# Patient Record
Sex: Male | Born: 1950 | Race: White | Hispanic: No | Marital: Married | Smoking: Former smoker
Health system: Southern US, Community
[De-identification: ages and names within clinical notes are randomized; demographics above are authoritative.]

## PROBLEM LIST (undated history)

## (undated) DIAGNOSIS — Z87891 Personal history of nicotine dependence: Secondary | ICD-10-CM

## (undated) DIAGNOSIS — I495 Sick sinus syndrome: Secondary | ICD-10-CM

## (undated) DIAGNOSIS — I4719 Other supraventricular tachycardia: Secondary | ICD-10-CM

## (undated) DIAGNOSIS — K219 Gastro-esophageal reflux disease without esophagitis: Secondary | ICD-10-CM

## (undated) DIAGNOSIS — M1990 Unspecified osteoarthritis, unspecified site: Secondary | ICD-10-CM

## (undated) DIAGNOSIS — I471 Supraventricular tachycardia: Secondary | ICD-10-CM

## (undated) DIAGNOSIS — F419 Anxiety disorder, unspecified: Secondary | ICD-10-CM

## (undated) DIAGNOSIS — E0590 Thyrotoxicosis, unspecified without thyrotoxic crisis or storm: Secondary | ICD-10-CM

## (undated) DIAGNOSIS — J189 Pneumonia, unspecified organism: Secondary | ICD-10-CM

## (undated) DIAGNOSIS — G4700 Insomnia, unspecified: Secondary | ICD-10-CM

## (undated) DIAGNOSIS — E785 Hyperlipidemia, unspecified: Secondary | ICD-10-CM

## (undated) DIAGNOSIS — M069 Rheumatoid arthritis, unspecified: Secondary | ICD-10-CM

## (undated) DIAGNOSIS — Z8619 Personal history of other infectious and parasitic diseases: Secondary | ICD-10-CM

## (undated) DIAGNOSIS — I10 Essential (primary) hypertension: Secondary | ICD-10-CM

## (undated) DIAGNOSIS — Z950 Presence of cardiac pacemaker: Secondary | ICD-10-CM

## (undated) DIAGNOSIS — I499 Cardiac arrhythmia, unspecified: Secondary | ICD-10-CM

## (undated) DIAGNOSIS — R001 Bradycardia, unspecified: Secondary | ICD-10-CM

## (undated) DIAGNOSIS — G8929 Other chronic pain: Secondary | ICD-10-CM

## (undated) HISTORY — DX: Anxiety disorder, unspecified: F41.9

## (undated) HISTORY — DX: Rheumatoid arthritis, unspecified: M06.9

## (undated) HISTORY — DX: Unspecified osteoarthritis, unspecified site: M19.90

## (undated) HISTORY — PX: COLONOSCOPY W/ POLYPECTOMY: SHX1380

## (undated) HISTORY — DX: Sick sinus syndrome: I49.5

## (undated) HISTORY — DX: Insomnia, unspecified: G47.00

## (undated) HISTORY — DX: Personal history of nicotine dependence: Z87.891

## (undated) HISTORY — DX: Supraventricular tachycardia: I47.1

## (undated) HISTORY — DX: Personal history of other infectious and parasitic diseases: Z86.19

## (undated) HISTORY — DX: Hyperlipidemia, unspecified: E78.5

## (undated) HISTORY — DX: Other supraventricular tachycardia: I47.19

---

## 2006-07-14 ENCOUNTER — Observation Stay (HOSPITAL_COMMUNITY): Admission: EM | Admit: 2006-07-14 | Discharge: 2006-07-14 | Payer: Self-pay | Admitting: Emergency Medicine

## 2007-04-29 ENCOUNTER — Ambulatory Visit (HOSPITAL_COMMUNITY): Admission: RE | Admit: 2007-04-29 | Discharge: 2007-04-29 | Payer: Self-pay | Admitting: Specialist

## 2007-09-09 ENCOUNTER — Encounter (HOSPITAL_COMMUNITY): Admission: RE | Admit: 2007-09-09 | Discharge: 2007-09-24 | Payer: Self-pay | Admitting: Endocrinology

## 2007-09-27 ENCOUNTER — Emergency Department (HOSPITAL_COMMUNITY): Admission: EM | Admit: 2007-09-27 | Discharge: 2007-09-28 | Payer: Self-pay | Admitting: Emergency Medicine

## 2008-05-11 ENCOUNTER — Emergency Department (HOSPITAL_COMMUNITY): Admission: EM | Admit: 2008-05-11 | Discharge: 2008-05-11 | Payer: Self-pay | Admitting: Family Medicine

## 2008-06-21 ENCOUNTER — Encounter: Admission: RE | Admit: 2008-06-21 | Discharge: 2008-06-21 | Payer: Self-pay | Admitting: Endocrinology

## 2009-11-29 ENCOUNTER — Encounter
Admission: RE | Admit: 2009-11-29 | Discharge: 2010-02-27 | Payer: Self-pay | Admitting: Physical Medicine and Rehabilitation

## 2009-12-07 ENCOUNTER — Ambulatory Visit: Payer: Self-pay | Admitting: Physical Medicine and Rehabilitation

## 2009-12-07 ENCOUNTER — Ambulatory Visit (HOSPITAL_COMMUNITY)
Admission: RE | Admit: 2009-12-07 | Discharge: 2009-12-07 | Payer: Self-pay | Admitting: Physical Medicine and Rehabilitation

## 2010-01-04 ENCOUNTER — Ambulatory Visit: Payer: Self-pay | Admitting: Physical Medicine and Rehabilitation

## 2010-01-08 ENCOUNTER — Inpatient Hospital Stay (HOSPITAL_COMMUNITY): Admission: EM | Admit: 2010-01-08 | Discharge: 2010-01-13 | Payer: Self-pay | Admitting: Emergency Medicine

## 2010-01-09 ENCOUNTER — Encounter (INDEPENDENT_AMBULATORY_CARE_PROVIDER_SITE_OTHER): Payer: Self-pay | Admitting: Cardiology

## 2010-01-10 ENCOUNTER — Encounter (INDEPENDENT_AMBULATORY_CARE_PROVIDER_SITE_OTHER): Payer: Self-pay | Admitting: Cardiology

## 2010-01-12 DIAGNOSIS — I495 Sick sinus syndrome: Secondary | ICD-10-CM

## 2010-01-12 HISTORY — PX: PACEMAKER INSERTION: SHX728

## 2010-01-12 HISTORY — DX: Sick sinus syndrome: I49.5

## 2010-05-01 ENCOUNTER — Encounter
Admission: RE | Admit: 2010-05-01 | Discharge: 2010-05-01 | Payer: Self-pay | Source: Home / Self Care | Attending: Gastroenterology | Admitting: Gastroenterology

## 2010-06-04 ENCOUNTER — Encounter: Payer: Self-pay | Admitting: Endocrinology

## 2010-06-04 ENCOUNTER — Encounter (HOSPITAL_COMMUNITY): Payer: Self-pay | Admitting: Internal Medicine

## 2010-06-13 ENCOUNTER — Encounter
Admission: RE | Admit: 2010-06-13 | Discharge: 2010-06-13 | Payer: Self-pay | Source: Home / Self Care | Attending: Cardiovascular Disease | Admitting: Cardiovascular Disease

## 2010-06-15 ENCOUNTER — Other Ambulatory Visit: Payer: Self-pay | Admitting: Gastroenterology

## 2010-07-05 HISTORY — PX: NM MYOCAR PERF WALL MOTION: HXRAD629

## 2010-07-27 LAB — BASIC METABOLIC PANEL
BUN: 9 mg/dL (ref 6–23)
CO2: 29 mEq/L (ref 19–32)
Calcium: 9.2 mg/dL (ref 8.4–10.5)
Chloride: 101 mEq/L (ref 96–112)
Creatinine, Ser: 0.86 mg/dL (ref 0.4–1.5)
GFR calc Af Amer: >60 mL/min (ref >60)
GFR calc non Af Amer: >60 mL/min (ref >60)
Glucose, Bld: 108 mg/dL — ABNORMAL HIGH (ref 70–99)
Potassium: 3.7 mEq/L (ref 3.5–5.1)
Sodium: 140 mEq/L (ref 135–145)

## 2010-07-27 LAB — CBC
HCT: 39.1 % (ref 39.0–52.0)
Hemoglobin: 13.9 g/dL (ref 13.0–17.0)
MCH: 28.7 pg (ref 26.0–34.0)
MCHC: 35.5 g/dL (ref 30.0–36.0)
MCV: 80.6 fL (ref 78.0–100.0)
Platelets: 103 10*3/uL — ABNORMAL LOW (ref 150–400)
RBC: 4.85 MIL/uL (ref 4.22–5.81)
RDW: 13.4 % (ref 11.5–15.5)
WBC: 4.7 10*3/uL (ref 4.0–10.5)

## 2010-07-27 LAB — PROTIME-INR
INR: 0.95 (ref 0.00–1.49)
Prothrombin Time: 12.9 seconds (ref 11.6–15.2)

## 2010-07-27 LAB — APTT: aPTT: 30 seconds (ref 24–37)

## 2010-07-28 LAB — RAPID URINE DRUG SCREEN, HOSP PERFORMED
Amphetamines: NOT DETECTED
Barbiturates: NOT DETECTED
Benzodiazepines: POSITIVE — AB
Cocaine: NOT DETECTED
Opiates: NOT DETECTED
Tetrahydrocannabinol: NOT DETECTED

## 2010-07-28 LAB — CBC
HCT: 40.9 % (ref 39.0–52.0)
HCT: 41.6 % (ref 39.0–52.0)
HCT: 43 % (ref 39.0–52.0)
HCT: 45 % (ref 39.0–52.0)
Hemoglobin: 14 g/dL (ref 13.0–17.0)
Hemoglobin: 14.7 g/dL (ref 13.0–17.0)
Hemoglobin: 15.2 g/dL (ref 13.0–17.0)
Hemoglobin: 16.3 g/dL (ref 13.0–17.0)
MCH: 28.4 pg (ref 26.0–34.0)
MCH: 28.7 pg (ref 26.0–34.0)
MCH: 29.2 pg (ref 26.0–34.0)
MCH: 29.7 pg (ref 26.0–34.0)
MCHC: 34.2 g/dL (ref 30.0–36.0)
MCHC: 35.3 g/dL (ref 30.0–36.0)
MCHC: 35.3 g/dL (ref 30.0–36.0)
MCHC: 36.2 g/dL — ABNORMAL HIGH (ref 30.0–36.0)
MCV: 81.3 fL (ref 78.0–100.0)
MCV: 82.1 fL (ref 78.0–100.0)
MCV: 82.5 fL (ref 78.0–100.0)
MCV: 83 fL (ref 78.0–100.0)
Platelets: 105 10*3/uL — ABNORMAL LOW (ref 150–400)
Platelets: 105 10*3/uL — ABNORMAL LOW (ref 150–400)
Platelets: 113 10*3/uL — ABNORMAL LOW (ref 150–400)
Platelets: 114 10*3/uL — ABNORMAL LOW (ref 150–400)
RBC: 4.93 MIL/uL (ref 4.22–5.81)
RBC: 5.12 MIL/uL (ref 4.22–5.81)
RBC: 5.21 MIL/uL (ref 4.22–5.81)
RBC: 5.48 MIL/uL (ref 4.22–5.81)
RDW: 13.4 % (ref 11.5–15.5)
RDW: 13.5 % (ref 11.5–15.5)
RDW: 13.6 % (ref 11.5–15.5)
RDW: 13.7 % (ref 11.5–15.5)
WBC: 4.8 10*3/uL (ref 4.0–10.5)
WBC: 4.9 10*3/uL (ref 4.0–10.5)
WBC: 5 10*3/uL (ref 4.0–10.5)
WBC: 5.3 10*3/uL (ref 4.0–10.5)

## 2010-07-28 LAB — APTT: aPTT: 30 seconds (ref 24–37)

## 2010-07-28 LAB — BASIC METABOLIC PANEL
BUN: 10 mg/dL (ref 6–23)
BUN: 10 mg/dL (ref 6–23)
BUN: 8 mg/dL (ref 6–23)
CO2: 28 mEq/L (ref 19–32)
CO2: 29 mEq/L (ref 19–32)
CO2: 32 mEq/L (ref 19–32)
Calcium: 9.3 mg/dL (ref 8.4–10.5)
Calcium: 9.3 mg/dL (ref 8.4–10.5)
Calcium: 9.7 mg/dL (ref 8.4–10.5)
Chloride: 103 mEq/L (ref 96–112)
Chloride: 103 mEq/L (ref 96–112)
Chloride: 106 mEq/L (ref 96–112)
Creatinine, Ser: 0.92 mg/dL (ref 0.4–1.5)
Creatinine, Ser: 0.93 mg/dL (ref 0.4–1.5)
Creatinine, Ser: 0.98 mg/dL (ref 0.4–1.5)
GFR calc Af Amer: >60 mL/min (ref >60)
GFR calc Af Amer: >60 mL/min (ref >60)
GFR calc Af Amer: >60 mL/min (ref >60)
GFR calc non Af Amer: >60 mL/min (ref >60)
GFR calc non Af Amer: >60 mL/min (ref >60)
GFR calc non Af Amer: >60 mL/min (ref >60)
Glucose, Bld: 100 mg/dL — ABNORMAL HIGH (ref 70–99)
Glucose, Bld: 100 mg/dL — ABNORMAL HIGH (ref 70–99)
Glucose, Bld: 103 mg/dL — ABNORMAL HIGH (ref 70–99)
Potassium: 3.7 mEq/L (ref 3.5–5.1)
Potassium: 3.9 mEq/L (ref 3.5–5.1)
Potassium: 4.5 mEq/L (ref 3.5–5.1)
Sodium: 137 mEq/L (ref 135–145)
Sodium: 138 mEq/L (ref 135–145)
Sodium: 140 mEq/L (ref 135–145)

## 2010-07-28 LAB — URINE CULTURE
Colony Count: NO GROWTH
Culture  Setup Time: 201108281953
Culture: NO GROWTH

## 2010-07-28 LAB — POCT I-STAT, CHEM 8
BUN: 10 mg/dL (ref 6–23)
Calcium, Ion: 1.18 mmol/L (ref 1.12–1.32)
Chloride: 101 mEq/L (ref 96–112)
Creatinine, Ser: 0.9 mg/dL (ref 0.4–1.5)
Glucose, Bld: 103 mg/dL — ABNORMAL HIGH (ref 70–99)
HCT: 49 % (ref 39.0–52.0)
Hemoglobin: 16.7 g/dL (ref 13.0–17.0)
Potassium: 4.4 mEq/L (ref 3.5–5.1)
Sodium: 142 mEq/L (ref 135–145)
TCO2: 30 mmol/L (ref 0–100)

## 2010-07-28 LAB — CK TOTAL AND CKMB (NOT AT ARMC)
CK, MB: 1 ng/mL (ref 0.3–4.0)
Relative Index: INVALID (ref 0.0–2.5)
Total CK: 58 U/L (ref 7–232)

## 2010-07-28 LAB — POCT CARDIAC MARKERS
CKMB, poc: <1 ng/mL — ABNORMAL LOW (ref 1.0–8.0)
Myoglobin, poc: 43 ng/mL (ref 12–200)
Troponin i, poc: <0.05 ng/mL (ref 0.00–0.09)

## 2010-07-28 LAB — CULTURE, BLOOD (ROUTINE X 2)
Culture: NO GROWTH
Culture: NO GROWTH

## 2010-07-28 LAB — ETHANOL: Alcohol, Ethyl (B): <5 mg/dL (ref 0–10)

## 2010-07-28 LAB — HEPATIC FUNCTION PANEL
ALT: 13 U/L (ref 0–53)
AST: 21 U/L (ref 0–37)
Albumin: 3.9 g/dL (ref 3.5–5.2)
Alkaline Phosphatase: 120 U/L — ABNORMAL HIGH (ref 39–117)
Bilirubin, Direct: 0.2 mg/dL (ref 0.0–0.3)
Indirect Bilirubin: 1.3 mg/dL — ABNORMAL HIGH (ref 0.3–0.9)
Total Bilirubin: 1.5 mg/dL — ABNORMAL HIGH (ref 0.3–1.2)
Total Protein: 6.5 g/dL (ref 6.0–8.3)

## 2010-07-28 LAB — URINALYSIS, ROUTINE W REFLEX MICROSCOPIC
Bilirubin Urine: NEGATIVE
Glucose, UA: NEGATIVE mg/dL
Hgb urine dipstick: NEGATIVE
Ketones, ur: NEGATIVE mg/dL
Nitrite: NEGATIVE
Protein, ur: NEGATIVE mg/dL
Specific Gravity, Urine: 1.005 (ref 1.005–1.030)
Urobilinogen, UA: 0.2 mg/dL (ref 0.0–1.0)
pH: 7 (ref 5.0–8.0)

## 2010-07-28 LAB — CARDIAC PANEL(CRET KIN+CKTOT+MB+TROPI)
CK, MB: 1 ng/mL (ref 0.3–4.0)
CK, MB: 1 ng/mL (ref 0.3–4.0)
Relative Index: INVALID (ref 0.0–2.5)
Relative Index: INVALID (ref 0.0–2.5)
Total CK: 52 U/L (ref 7–232)
Total CK: 53 U/L (ref 7–232)
Troponin I: 0.01 ng/mL (ref 0.00–0.06)
Troponin I: 0.01 ng/mL (ref 0.00–0.06)

## 2010-07-28 LAB — AMYLASE: Amylase: 59 U/L (ref 0–105)

## 2010-07-28 LAB — MRSA PCR SCREENING: MRSA by PCR: NEGATIVE

## 2010-07-28 LAB — HEPATITIS PANEL, ACUTE
HCV Ab: NEGATIVE
Hep A IgM: NEGATIVE
Hep B C IgM: NEGATIVE
Hepatitis B Surface Ag: NEGATIVE

## 2010-07-28 LAB — LIPID PANEL
Cholesterol: 225 mg/dL — ABNORMAL HIGH (ref 0–200)
HDL: 42 mg/dL (ref >39)
LDL Cholesterol: 165 mg/dL — ABNORMAL HIGH (ref 0–99)
Total CHOL/HDL Ratio: 5.4 RATIO
Triglycerides: 92 mg/dL (ref <150)
VLDL: 18 mg/dL (ref 0–40)

## 2010-07-28 LAB — HEMOGLOBIN A1C
Hgb A1c MFr Bld: 5.4 % (ref <5.7)
Mean Plasma Glucose: 108 mg/dL (ref <117)

## 2010-07-28 LAB — PROTIME-INR
INR: 0.95 (ref 0.00–1.49)
Prothrombin Time: 12.9 seconds (ref 11.6–15.2)

## 2010-07-28 LAB — MAGNESIUM: Magnesium: 2.1 mg/dL (ref 1.5–2.5)

## 2010-07-28 LAB — TSH
TSH: 0.01 u[IU]/mL — ABNORMAL LOW (ref 0.350–4.500)
TSH: 0.012 u[IU]/mL — ABNORMAL LOW (ref 0.350–4.500)

## 2010-07-28 LAB — T4, FREE: Free T4: 1.7 ng/dL (ref 0.80–1.80)

## 2010-07-28 LAB — DIFFERENTIAL
Basophils Absolute: 0 10*3/uL (ref 0.0–0.1)
Basophils Relative: 0 % (ref 0–1)
Eosinophils Absolute: 0 10*3/uL (ref 0.0–0.7)
Eosinophils Relative: 1 % (ref 0–5)
Lymphocytes Relative: 35 % (ref 12–46)
Lymphs Abs: 1.8 10*3/uL (ref 0.7–4.0)
Monocytes Absolute: 0.2 10*3/uL (ref 0.1–1.0)
Monocytes Relative: 5 % (ref 3–12)
Neutro Abs: 3 10*3/uL (ref 1.7–7.7)
Neutrophils Relative %: 59 % (ref 43–77)

## 2010-07-28 LAB — TROPONIN I: Troponin I: 0.01 ng/mL (ref 0.00–0.06)

## 2010-07-28 LAB — LIPASE, BLOOD: Lipase: 30 U/L (ref 11–59)

## 2010-07-28 LAB — AMMONIA: Ammonia: 24 umol/L (ref 11–35)

## 2010-07-28 LAB — T3, FREE: T3, Free: 3.1 pg/mL (ref 2.3–4.2)

## 2010-09-29 NOTE — H&P (Signed)
NAME:  Roy Tucker, Roy Tucker NO.:  [id number]   MEDICAL RECORD NO.:  [id number]          PATIENT TYPE:  OBV   LOCATION:  4714                         FACILITY:  [facility]   PHYSICIAN:  Hettie Holstein, D.O.    DATE OF BIRTH:  12-23-1950   DATE OF ADMISSION:  07/13/2006  DATE OF DISCHARGE:  07/14/2006                              HISTORY & PHYSICAL   PRIMARY CARE PHYSICIAN:  Unassigned.   HISTORY OF PRESENT ILLNESS:  Roy Tucker is a 60 year old Caucasian  male with a relatively unknown and unremarkable medical history due to  an outpatient continuity with a care Lilton Pare was in his usual state of  health according to his long-time companion and girlfriend.  He had been  drinking this evening watching a race and apparently came home  intoxicated according to his partner.  They conversed for a short while,  and she walked him back stumbling to his bedroom, and went back to the  living area of their home, and subsequently she heard him holler and  call out.  She arrived in the room and she had discovered that he had  vomited all over the place.  He continued to heave and subsequently she  noticed him turning blue and became unresponsive and limp.  She  discerned that he was not breathing, and she proceed to perform CPR on  him.  She dispatched EMS.  Eventually, she did turn him on his side, and  he vomited once again and then began to breath.  She stated that he was  out for approximately 45 minutes.  Upon EMS arrival, he was arousable  and awake.  He did receive some Narcan in the field and oxygenating at  98% on room air.   Roy Tucker does not drink on a routine or regular basis, but it  appears that he had a little too much to drink this past evening, and in  all likelihood aspirated perhaps causing airway compromise.  He is seen  in the emergency department.  He is a little groggy and lethargic, but  he does answer some simple questions and is following simple  commands.   Past medical history is unknown, and he does not have outpatient  continuity.  He denies ever having surgery in the past.   MEDICATIONS:  He takes no routine medications at home.   ALLERGIES:  He claims to have no known drug allergies.   SOCIAL HISTORY:  He currently works at an [employer]  He does  have three children of his own.  He smokes one plus packs of cigarettes  per day.  He does not drink alcohol on a routine weekly basis, only  occasionally.  He lives with a long-time partner for 21 years.  Ms.  Janee Morn reached at [phone].   FAMILY HISTORY:  His father is alive in his 68s, and his mother died at  age 6 following an accident.  He does have an uncle who does have  rectal cancer.   REVIEW OF SYSTEMS:  This is not obtainable from Roy Tucker at this  time.  PHYSICAL EXAMINATION:  In the emergency department, his blood pressure  was 178/84, temperature 97.6, heart rate 81, respirations are 18, oxygen  saturations 98.  HEENT:  Roy Tucker does not appear to be toxic.  He head is  normocephalic and atraumatic.  Extraocular muscles are intact.  NECK:  Supple and nontender, no palpable thyromegaly or mass.  Anterior chest wall at the sternoclavicular joint does have a firm and  tender swelling.  There is no erythema.  This is about 3 cm in diameter  and appears to be over the bony prominence of the sternoclavicular  joint.  ABDOMEN:  Soft, nontender, no apparent surgical scars, no rebound or  guarding.  EXTREMITIES:  Lower extremities nontender, no calf edema, and no focal  neurologic deficits on examination.   LABORATORY DATA:  Sodium 138, potassium 3.7, BUN 6, creatinine 0.9,  glucose 104, chloride 107, CO2 24, alcohol level 226 mg/dl.   Chest x-ray was suboptimal.  There is recommendations for followup.   The EKG:  Emergency department physician did not order.  I am requesting  this at present.   ASSESSMENT:  1. Alcohol intoxication.   2. Status post CPR per bystander without BLS training.  3. Probable aspiration pneumonitis.  4. Tobacco dependence.  5. Poor dentition.  6. Optimal chest x-ray.   PLAN:  At this time, we will admit Roy Tucker for observation,  administer additional IV hydration, and replete nutritionally with  thiamine and folate.  He does have quite poor dentition and would not be  surprised if he did ultimately develop an abscess.  Will cover him for  the initial phase here with broad-spectrum antibiotics and with some  anaerobic coverage.  In any event, will follow his clinical course.  He  appears to be stable at this time.  We will follow him on the telemetry  floor.      Hettie Holstein, D.O.  Electronically Signed     ESS/MEDQ  D:  07/14/2006  T:  07/14/2006  Job:  [id number]

## 2010-12-26 ENCOUNTER — Other Ambulatory Visit: Payer: Self-pay | Admitting: Cardiovascular Disease

## 2010-12-26 ENCOUNTER — Ambulatory Visit
Admission: RE | Admit: 2010-12-26 | Discharge: 2010-12-26 | Disposition: A | Discharge status: Home / Self Care | Payer: Medicaid Other | Source: Ambulatory Visit | Attending: Cardiovascular Disease | Admitting: Cardiovascular Disease

## 2010-12-26 DIAGNOSIS — R0602 Shortness of breath: Secondary | ICD-10-CM

## 2010-12-28 ENCOUNTER — Ambulatory Visit
Admission: RE | Admit: 2010-12-28 | Discharge: 2010-12-28 | Disposition: A | Discharge status: Home / Self Care | Payer: Medicaid Other | Source: Ambulatory Visit | Attending: Cardiovascular Disease | Admitting: Cardiovascular Disease

## 2010-12-28 ENCOUNTER — Other Ambulatory Visit: Payer: Self-pay | Admitting: Cardiovascular Disease

## 2010-12-28 DIAGNOSIS — I749 Embolism and thrombosis of unspecified artery: Secondary | ICD-10-CM

## 2010-12-28 MED ORDER — IOHEXOL 300 MG/ML  SOLN: 125.0000 mL | Freq: Once | INTRAMUSCULAR | Status: AC | PRN

## 2011-02-07 LAB — URINALYSIS, ROUTINE W REFLEX MICROSCOPIC
Bilirubin Urine: NEGATIVE
Glucose, UA: NEGATIVE
Hgb urine dipstick: NEGATIVE
Ketones, ur: NEGATIVE
Nitrite: NEGATIVE
Protein, ur: NEGATIVE
Specific Gravity, Urine: 1.011
Urobilinogen, UA: 1
pH: 6.5

## 2011-02-07 LAB — DIFFERENTIAL
Basophils Absolute: 0
Basophils Relative: 1
Eosinophils Absolute: 0.1
Eosinophils Relative: 1
Lymphocytes Relative: 30
Lymphs Abs: 1.8
Monocytes Absolute: 0.5
Monocytes Relative: 8
Neutro Abs: 3.7
Neutrophils Relative %: 60

## 2011-02-07 LAB — COMPREHENSIVE METABOLIC PANEL
ALT: 26
AST: 23
Albumin: 3.3 — ABNORMAL LOW
Alkaline Phosphatase: 209 — ABNORMAL HIGH
BUN: 10
CO2: 29
Calcium: 10.4
Chloride: 104
Creatinine, Ser: 0.42
GFR calc Af Amer: >60
GFR calc non Af Amer: >60
Glucose, Bld: 108 — ABNORMAL HIGH
Potassium: 4.3
Sodium: 140
Total Bilirubin: 2.4 — ABNORMAL HIGH
Total Protein: 6.8

## 2011-02-07 LAB — CBC
HCT: 35.8 — ABNORMAL LOW
Hemoglobin: 12 — ABNORMAL LOW
MCHC: 33.5
MCV: 79.4
Platelets: 113 — ABNORMAL LOW
RBC: 4.51
RDW: 14.7
WBC: 6.1

## 2011-02-07 LAB — LIPASE, BLOOD: Lipase: 18

## 2011-03-18 ENCOUNTER — Emergency Department (HOSPITAL_COMMUNITY)
Admission: EM | Admit: 2011-03-18 | Discharge: 2011-03-18 | Disposition: A | Discharge status: Home / Self Care | Payer: Medicaid Other | Attending: Emergency Medicine | Admitting: Emergency Medicine

## 2011-03-18 ENCOUNTER — Emergency Department (HOSPITAL_COMMUNITY): Payer: Medicaid Other

## 2011-03-18 ENCOUNTER — Encounter: Payer: Self-pay | Admitting: Emergency Medicine

## 2011-03-18 DIAGNOSIS — Z8639 Personal history of other endocrine, nutritional and metabolic disease: Secondary | ICD-10-CM | POA: Insufficient documentation

## 2011-03-18 DIAGNOSIS — R197 Diarrhea, unspecified: Secondary | ICD-10-CM | POA: Insufficient documentation

## 2011-03-18 DIAGNOSIS — J3489 Other specified disorders of nose and nasal sinuses: Secondary | ICD-10-CM | POA: Insufficient documentation

## 2011-03-18 DIAGNOSIS — I10 Essential (primary) hypertension: Secondary | ICD-10-CM | POA: Insufficient documentation

## 2011-03-18 DIAGNOSIS — R5381 Other malaise: Secondary | ICD-10-CM | POA: Insufficient documentation

## 2011-03-18 DIAGNOSIS — Z950 Presence of cardiac pacemaker: Secondary | ICD-10-CM | POA: Insufficient documentation

## 2011-03-18 DIAGNOSIS — B349 Viral infection, unspecified: Secondary | ICD-10-CM

## 2011-03-18 DIAGNOSIS — R112 Nausea with vomiting, unspecified: Secondary | ICD-10-CM

## 2011-03-18 DIAGNOSIS — R51 Headache: Secondary | ICD-10-CM | POA: Insufficient documentation

## 2011-03-18 DIAGNOSIS — B9789 Other viral agents as the cause of diseases classified elsewhere: Secondary | ICD-10-CM | POA: Insufficient documentation

## 2011-03-18 DIAGNOSIS — M79609 Pain in unspecified limb: Secondary | ICD-10-CM | POA: Insufficient documentation

## 2011-03-18 DIAGNOSIS — Z862 Personal history of diseases of the blood and blood-forming organs and certain disorders involving the immune mechanism: Secondary | ICD-10-CM | POA: Insufficient documentation

## 2011-03-18 DIAGNOSIS — R5383 Other fatigue: Secondary | ICD-10-CM | POA: Insufficient documentation

## 2011-03-18 DIAGNOSIS — Z79899 Other long term (current) drug therapy: Secondary | ICD-10-CM | POA: Insufficient documentation

## 2011-03-18 DIAGNOSIS — R109 Unspecified abdominal pain: Secondary | ICD-10-CM | POA: Insufficient documentation

## 2011-03-18 DIAGNOSIS — M129 Arthropathy, unspecified: Secondary | ICD-10-CM | POA: Insufficient documentation

## 2011-03-18 HISTORY — DX: Essential (primary) hypertension: I10

## 2011-03-18 HISTORY — DX: Other chronic pain: G89.29

## 2011-03-18 HISTORY — DX: Unspecified osteoarthritis, unspecified site: M19.90

## 2011-03-18 LAB — COMPREHENSIVE METABOLIC PANEL
ALT: 14 U/L (ref 0–53)
AST: 19 U/L (ref 0–37)
Albumin: 4.3 g/dL (ref 3.5–5.2)
Alkaline Phosphatase: 119 U/L — ABNORMAL HIGH (ref 39–117)
BUN: 13 mg/dL (ref 6–23)
CO2: 28 mEq/L (ref 19–32)
Calcium: 10.1 mg/dL (ref 8.4–10.5)
Chloride: 100 mEq/L (ref 96–112)
Creatinine, Ser: 0.88 mg/dL (ref 0.50–1.35)
GFR calc Af Amer: >90 mL/min (ref >90)
GFR calc non Af Amer: >90 mL/min (ref >90)
Glucose, Bld: 115 mg/dL — ABNORMAL HIGH (ref 70–99)
Potassium: 4.6 mEq/L (ref 3.5–5.1)
Sodium: 136 mEq/L (ref 135–145)
Total Bilirubin: 1.7 mg/dL — ABNORMAL HIGH (ref 0.3–1.2)
Total Protein: 8 g/dL (ref 6.0–8.3)

## 2011-03-18 LAB — CBC
HCT: 41.6 % (ref 39.0–52.0)
Hemoglobin: 15.1 g/dL (ref 13.0–17.0)
MCH: 30.5 pg (ref 26.0–34.0)
MCHC: 36.3 g/dL — ABNORMAL HIGH (ref 30.0–36.0)
MCV: 84 fL (ref 78.0–100.0)
Platelets: 115 10*3/uL — ABNORMAL LOW (ref 150–400)
RBC: 4.95 MIL/uL (ref 4.22–5.81)
RDW: 13.9 % (ref 11.5–15.5)
WBC: 6.6 10*3/uL (ref 4.0–10.5)

## 2011-03-18 LAB — URINALYSIS, MICROSCOPIC ONLY
Bilirubin Urine: NEGATIVE
Glucose, UA: NEGATIVE mg/dL
Hgb urine dipstick: NEGATIVE
Ketones, ur: NEGATIVE mg/dL
Nitrite: NEGATIVE
Protein, ur: NEGATIVE mg/dL
Specific Gravity, Urine: 1.024 (ref 1.005–1.030)
Urobilinogen, UA: 1 mg/dL (ref 0.0–1.0)
pH: 7.5 (ref 5.0–8.0)

## 2011-03-18 LAB — DIFFERENTIAL
Basophils Absolute: 0 10*3/uL (ref 0.0–0.1)
Basophils Relative: 0 % (ref 0–1)
Eosinophils Absolute: 0 10*3/uL (ref 0.0–0.7)
Eosinophils Relative: 0 % (ref 0–5)
Lymphocytes Relative: 8 % — ABNORMAL LOW (ref 12–46)
Lymphs Abs: 0.5 10*3/uL — ABNORMAL LOW (ref 0.7–4.0)
Monocytes Absolute: 0.2 10*3/uL (ref 0.1–1.0)
Monocytes Relative: 3 % (ref 3–12)
Neutro Abs: 5.9 10*3/uL (ref 1.7–7.7)
Neutrophils Relative %: 89 % — ABNORMAL HIGH (ref 43–77)

## 2011-03-18 LAB — LIPASE, BLOOD: Lipase: 25 U/L (ref 11–59)

## 2011-03-18 MED ORDER — ONDANSETRON HCL 4 MG/2ML IJ SOLN
4.0000 mg | Freq: Once | INTRAMUSCULAR | Status: AC
  Administered 2011-03-18: 4 mg via INTRAVENOUS
  Filled 2011-03-18: qty 2

## 2011-03-18 MED ORDER — ACETAMINOPHEN 325 MG PO TABS
650.0000 mg | ORAL_TABLET | Freq: Once | ORAL | Status: AC
  Administered 2011-03-18: 650 mg via ORAL
  Filled 2011-03-18: qty 2

## 2011-03-18 MED ORDER — SODIUM CHLORIDE 0.9 % IV BOLUS (SEPSIS)
1000.0000 mL | Freq: Once | INTRAVENOUS | Status: AC
  Administered 2011-03-18: 1000 mL via INTRAVENOUS

## 2011-03-18 NOTE — ED Notes (Signed)
Pt undressed and placed in gown. Pt placed on cardiac monitor, bp cuff, and pulse ox.  

## 2011-03-18 NOTE — ED Notes (Signed)
Pt c/o left arm pain and tingling x 1 week; pt sts woke up this am with N/V/D and aches

## 2011-03-18 NOTE — ED Notes (Signed)
Pt given urinal and is aware of need for sample.

## 2011-03-18 NOTE — ED Provider Notes (Signed)
History     CSN: [id number] Arrival date & time: 03/18/2011  7:39 AM   First MD Initiated Contact with Patient 03/18/11 0740      Chief Complaint  Patient presents with  . Nausea    N/V/D, left arm pain x 1 week    (Consider location/radiation/quality/duration/timing/severity/associated sxs/prior treatment) HPI The patient is a 60 yo M who presents today complaining of general malaise since yesterday with one episode of vomiting and diarrhea.  Neither of these was bloody.  He has mild headache and non-productive cough.  He denies fevers and knows of no sick contacts.  He endorses 5/10 diffuse abdominal pain and mild nausea.  He has not taken anything to make this better and he can not think of anything that makes it worse.  There are no other modifying factors. Past Medical History  Diagnosis Date  . Hypertension   . Arthritis   . Gout   . Chronic pain     Past Surgical History  Procedure Date  . Pacemaker insertion     No family history on file.  History  Substance Use Topics  . Smoking status: Never Smoker   . Smokeless tobacco: Not on file  . Alcohol Use: No      Review of Systems  Constitutional: Positive for fatigue.  HENT: Positive for congestion.   Eyes: Negative.   Respiratory: Positive for cough.   Cardiovascular: Negative.   Gastrointestinal: Positive for nausea, vomiting, abdominal pain and diarrhea.  Genitourinary: Negative.   Musculoskeletal: Negative.   Skin: Negative.   Neurological: Positive for headaches.  Hematological: Negative.   Psychiatric/Behavioral: Negative.   All other systems reviewed and are negative.    Allergies  Aspirin  Home Medications   Current Outpatient Rx  Name Route Sig Dispense Refill  . AMLODIPINE BESYLATE 5 MG PO TABS Oral Take 5 mg by mouth daily.     Marland Kitchen ESOMEPRAZOLE MAGNESIUM 20 MG PO CPDR Oral Take 20 mg by mouth daily before breakfast.      . HYDROCODONE-ACETAMINOPHEN 5-325 MG PO TABS Oral Take 1 tablet by  mouth every 6 (six) hours as needed. For  pain      BP 123/69  Pulse 77  Temp(Src) 96.9 F (36.1 C) (Oral)  Resp 16  SpO2 99%  Physical Exam  Nursing note and vitals reviewed. Constitutional: He is oriented to person, place, and time. He appears well-developed and well-nourished. No distress.  HENT:  Head: Normocephalic and atraumatic.  Eyes: Conjunctivae and EOM are normal. Pupils are equal, round, and reactive to light.  Neck: Normal range of motion.  Cardiovascular: Normal rate, regular rhythm, normal heart sounds and intact distal pulses.  Exam reveals no gallop and no friction rub.   No murmur heard. Pulmonary/Chest: Effort normal and breath sounds normal. No respiratory distress. He has no wheezes. He has no rales.  Abdominal: Soft. Bowel sounds are normal. He exhibits no distension. There is no tenderness. There is no rebound and no guarding.  Musculoskeletal: Normal range of motion. He exhibits no edema and no tenderness.  Neurological: He is alert and oriented to person, place, and time. No cranial nerve deficit. He exhibits normal muscle tone. Coordination normal.  Skin: Skin is warm and dry. No rash noted. No erythema.  Psychiatric: He has a normal mood and affect.    ED Course  Procedures (including critical care time)  Labs Reviewed  CBC - Abnormal; Notable for the following:    MCHC 36.3 (*)  Platelets 115 (*) PLATELET COUNT CONFIRMED BY SMEAR   All other components within normal limits  DIFFERENTIAL - Abnormal; Notable for the following:    Neutrophils Relative 89 (*)    Lymphocytes Relative 8 (*)    Lymphs Abs 0.5 (*)    All other components within normal limits  COMPREHENSIVE METABOLIC PANEL - Abnormal; Notable for the following:    Glucose, Bld 115 (*)    Alkaline Phosphatase 119 (*)    Total Bilirubin 1.7 (*)    All other components within normal limits  URINALYSIS, MICROSCOPIC ONLY - Abnormal; Notable for the following:    Leukocytes, UA SMALL (*)      All other components within normal limits  LIPASE, BLOOD   Dg Chest 2 View  03/18/2011  *RADIOLOGY REPORT*  Clinical Data: Nausea vomiting.  CHEST - 2 VIEW  Comparison: 12/26/2010  Findings: The lungs are clear without focal infiltrate, edema, pneumothorax or pleural effusion.  The cardiopericardial silhouette is within normal limits for size. Interstitial markings are diffusely coarsened with chronic features.  Left-sided permanent pacemaker remains in place. Bones are diffusely demineralized.  IMPRESSION: Stable.  No acute cardiopulmonary process.  Original Report Authenticated By: ERIC A. MANSELL, M.D.     1. Headache   2. Viral disease   3. Nausea & vomiting   4. Diarrhea       MDM  Patient was HDS and relatively well-appearing though mildly uncomfortable.  The patient had work-up for his GI symptoms and was given tylenol for his headache.  Patient was given 1 L NS IV bolus with zofran.  He felt better after this.  Work-up with CBC, CMP, lipase, and UA was remarkable for thrombocytopenia and mild elevation of Alkaline phosphatase and bilirubin.  These were unchanged from previous labs on review of patient's chart.  He ad family were comfortable with plan for discharge home and he was discharged in good condition.        Cyndra Numbers, MD 03/18/11 2043

## 2011-03-22 ENCOUNTER — Encounter (HOSPITAL_COMMUNITY): Payer: Self-pay | Admitting: Emergency Medicine

## 2011-07-24 ENCOUNTER — Other Ambulatory Visit: Payer: Self-pay | Admitting: Internal Medicine

## 2011-07-24 DIAGNOSIS — E0500 Thyrotoxicosis with diffuse goiter without thyrotoxic crisis or storm: Secondary | ICD-10-CM

## 2011-07-24 DIAGNOSIS — E0590 Thyrotoxicosis, unspecified without thyrotoxic crisis or storm: Secondary | ICD-10-CM

## 2011-08-14 ENCOUNTER — Ambulatory Visit (HOSPITAL_COMMUNITY): Payer: Medicaid Other

## 2011-08-15 ENCOUNTER — Other Ambulatory Visit (HOSPITAL_COMMUNITY): Payer: Medicaid Other

## 2011-08-23 ENCOUNTER — Ambulatory Visit (HOSPITAL_COMMUNITY): Payer: Medicaid Other

## 2011-08-24 ENCOUNTER — Other Ambulatory Visit (HOSPITAL_COMMUNITY): Payer: Medicaid Other

## 2011-08-29 ENCOUNTER — Encounter (HOSPITAL_COMMUNITY)
Admission: RE | Admit: 2011-08-29 | Discharge: 2011-08-29 | Disposition: A | Discharge status: Home / Self Care | Payer: Medicaid Other | Source: Ambulatory Visit | Attending: Internal Medicine | Admitting: Internal Medicine

## 2011-08-29 DIAGNOSIS — E0590 Thyrotoxicosis, unspecified without thyrotoxic crisis or storm: Secondary | ICD-10-CM | POA: Insufficient documentation

## 2011-08-29 DIAGNOSIS — E0500 Thyrotoxicosis with diffuse goiter without thyrotoxic crisis or storm: Secondary | ICD-10-CM | POA: Insufficient documentation

## 2011-08-30 ENCOUNTER — Encounter (HOSPITAL_COMMUNITY)
Admission: RE | Admit: 2011-08-30 | Discharge: 2011-08-30 | Disposition: A | Discharge status: Home / Self Care | Payer: Medicaid Other | Source: Ambulatory Visit | Attending: Internal Medicine | Admitting: Internal Medicine

## 2011-08-30 DIAGNOSIS — E0590 Thyrotoxicosis, unspecified without thyrotoxic crisis or storm: Secondary | ICD-10-CM | POA: Insufficient documentation

## 2011-08-30 DIAGNOSIS — E0500 Thyrotoxicosis with diffuse goiter without thyrotoxic crisis or storm: Secondary | ICD-10-CM | POA: Insufficient documentation

## 2011-08-30 MED ORDER — SODIUM IODIDE I 131 CAPSULE
14.7000 | Freq: Once | INTRAVENOUS | Status: AC | PRN
  Administered 2011-08-30: 14.7 via ORAL

## 2011-08-30 MED ORDER — SODIUM PERTECHNETATE TC 99M INJECTION
10.0000 | Freq: Once | INTRAVENOUS | Status: AC | PRN
  Administered 2011-08-30: 10 via INTRAVENOUS

## 2011-10-01 ENCOUNTER — Emergency Department (HOSPITAL_COMMUNITY)
Admission: EM | Admit: 2011-10-01 | Discharge: 2011-10-01 | Disposition: A | Discharge status: Home / Self Care | Payer: Medicaid Other | Attending: Emergency Medicine | Admitting: Emergency Medicine

## 2011-10-01 ENCOUNTER — Encounter (HOSPITAL_COMMUNITY): Payer: Self-pay

## 2011-10-01 ENCOUNTER — Emergency Department (HOSPITAL_COMMUNITY): Payer: Medicaid Other

## 2011-10-01 ENCOUNTER — Other Ambulatory Visit: Payer: Self-pay | Admitting: Internal Medicine

## 2011-10-01 DIAGNOSIS — J189 Pneumonia, unspecified organism: Secondary | ICD-10-CM

## 2011-10-01 DIAGNOSIS — R5381 Other malaise: Secondary | ICD-10-CM | POA: Insufficient documentation

## 2011-10-01 DIAGNOSIS — Z950 Presence of cardiac pacemaker: Secondary | ICD-10-CM | POA: Insufficient documentation

## 2011-10-01 DIAGNOSIS — R209 Unspecified disturbances of skin sensation: Secondary | ICD-10-CM | POA: Insufficient documentation

## 2011-10-01 DIAGNOSIS — R0602 Shortness of breath: Secondary | ICD-10-CM | POA: Insufficient documentation

## 2011-10-01 DIAGNOSIS — Z79899 Other long term (current) drug therapy: Secondary | ICD-10-CM | POA: Insufficient documentation

## 2011-10-01 DIAGNOSIS — R5383 Other fatigue: Secondary | ICD-10-CM | POA: Insufficient documentation

## 2011-10-01 DIAGNOSIS — E0590 Thyrotoxicosis, unspecified without thyrotoxic crisis or storm: Secondary | ICD-10-CM

## 2011-10-01 DIAGNOSIS — R112 Nausea with vomiting, unspecified: Secondary | ICD-10-CM | POA: Insufficient documentation

## 2011-10-01 DIAGNOSIS — M129 Arthropathy, unspecified: Secondary | ICD-10-CM | POA: Insufficient documentation

## 2011-10-01 DIAGNOSIS — Z8639 Personal history of other endocrine, nutritional and metabolic disease: Secondary | ICD-10-CM | POA: Insufficient documentation

## 2011-10-01 DIAGNOSIS — R079 Chest pain, unspecified: Secondary | ICD-10-CM | POA: Insufficient documentation

## 2011-10-01 DIAGNOSIS — E0500 Thyrotoxicosis with diffuse goiter without thyrotoxic crisis or storm: Secondary | ICD-10-CM

## 2011-10-01 DIAGNOSIS — R109 Unspecified abdominal pain: Secondary | ICD-10-CM | POA: Insufficient documentation

## 2011-10-01 DIAGNOSIS — Z862 Personal history of diseases of the blood and blood-forming organs and certain disorders involving the immune mechanism: Secondary | ICD-10-CM | POA: Insufficient documentation

## 2011-10-01 DIAGNOSIS — R6883 Chills (without fever): Secondary | ICD-10-CM | POA: Insufficient documentation

## 2011-10-01 DIAGNOSIS — I10 Essential (primary) hypertension: Secondary | ICD-10-CM | POA: Insufficient documentation

## 2011-10-01 LAB — COMPREHENSIVE METABOLIC PANEL
ALT: 17 U/L (ref 0–53)
AST: 12 U/L (ref 0–37)
Albumin: 3.7 g/dL (ref 3.5–5.2)
Alkaline Phosphatase: 78 U/L (ref 39–117)
BUN: 10 mg/dL (ref 6–23)
CO2: 25 mEq/L (ref 19–32)
Calcium: 9.2 mg/dL (ref 8.4–10.5)
Chloride: 98 mEq/L (ref 96–112)
Creatinine, Ser: 0.67 mg/dL (ref 0.50–1.35)
GFR calc Af Amer: >90 mL/min (ref >90)
GFR calc non Af Amer: >90 mL/min (ref >90)
Glucose, Bld: 112 mg/dL — ABNORMAL HIGH (ref 70–99)
Potassium: 3.6 mEq/L (ref 3.5–5.1)
Sodium: 136 mEq/L (ref 135–145)
Total Bilirubin: 1.1 mg/dL (ref 0.3–1.2)
Total Protein: 7.1 g/dL (ref 6.0–8.3)

## 2011-10-01 LAB — CBC
HCT: 38.2 % — ABNORMAL LOW (ref 39.0–52.0)
Hemoglobin: 13.2 g/dL (ref 13.0–17.0)
MCH: 29.3 pg (ref 26.0–34.0)
MCHC: 34.6 g/dL (ref 30.0–36.0)
MCV: 84.7 fL (ref 78.0–100.0)
Platelets: 136 10*3/uL — ABNORMAL LOW (ref 150–400)
RBC: 4.51 MIL/uL (ref 4.22–5.81)
RDW: 13.3 % (ref 11.5–15.5)
WBC: 11.3 10*3/uL — ABNORMAL HIGH (ref 4.0–10.5)

## 2011-10-01 LAB — URINALYSIS, ROUTINE W REFLEX MICROSCOPIC
Bilirubin Urine: NEGATIVE
Glucose, UA: NEGATIVE mg/dL
Hgb urine dipstick: NEGATIVE
Ketones, ur: NEGATIVE mg/dL
Leukocytes, UA: NEGATIVE
Nitrite: NEGATIVE
Protein, ur: NEGATIVE mg/dL
Specific Gravity, Urine: 1.019 (ref 1.005–1.030)
Urobilinogen, UA: 1 mg/dL (ref 0.0–1.0)
pH: 5.5 (ref 5.0–8.0)

## 2011-10-01 LAB — RAPID URINE DRUG SCREEN, HOSP PERFORMED
Amphetamines: NOT DETECTED
Barbiturates: NOT DETECTED
Benzodiazepines: NOT DETECTED
Cocaine: NOT DETECTED
Opiates: NOT DETECTED
Tetrahydrocannabinol: NOT DETECTED

## 2011-10-01 LAB — DIFFERENTIAL
Basophils Absolute: 0 K/uL (ref 0.0–0.1)
Basophils Relative: 0 % (ref 0–1)
Eosinophils Absolute: 0 10*3/uL (ref 0.0–0.7)
Eosinophils Relative: 0 % (ref 0–5)
Lymphocytes Relative: 6 % — ABNORMAL LOW (ref 12–46)
Lymphs Abs: 0.7 10*3/uL (ref 0.7–4.0)
Monocytes Absolute: 0.3 10*3/uL (ref 0.1–1.0)
Monocytes Relative: 3 % (ref 3–12)
Neutro Abs: 10.3 10*3/uL — ABNORMAL HIGH (ref 1.7–7.7)
Neutrophils Relative %: 91 % — ABNORMAL HIGH (ref 43–77)

## 2011-10-01 LAB — LIPASE, BLOOD: Lipase: 26 U/L (ref 11–59)

## 2011-10-01 LAB — TROPONIN I: Troponin I: <0.3 ng/mL (ref <0.30)

## 2011-10-01 MED ORDER — AZITHROMYCIN 250 MG PO TABS: 250.0000 mg | ORAL_TABLET | Freq: Every day | ORAL | Status: AC

## 2011-10-01 MED ORDER — DEXTROSE 5 % IV SOLN
1.0000 g | Freq: Once | INTRAVENOUS | Status: AC
  Administered 2011-10-01: 1 g via INTRAVENOUS
  Filled 2011-10-01: qty 10

## 2011-10-01 MED ORDER — ONDANSETRON 4 MG PO TBDP
8.0000 mg | ORAL_TABLET | Freq: Once | ORAL | Status: AC
  Administered 2011-10-01: 8 mg via ORAL
  Filled 2011-10-01: qty 2

## 2011-10-01 MED ORDER — AZITHROMYCIN 250 MG PO TABS
500.0000 mg | ORAL_TABLET | Freq: Once | ORAL | Status: AC
  Administered 2011-10-01: 500 mg via ORAL
  Filled 2011-10-01: qty 2

## 2011-10-01 MED ORDER — SODIUM CHLORIDE 0.9 % IV SOLN
Freq: Once | INTRAVENOUS | Status: AC
  Administered 2011-10-01: 12:00:00 via INTRAVENOUS

## 2011-10-01 MED ORDER — ALBUTEROL SULFATE HFA 108 (90 BASE) MCG/ACT IN AERS: 2.0000 | INHALATION_SPRAY | Freq: Four times a day (QID) | RESPIRATORY_TRACT | Status: DC | PRN

## 2011-10-01 NOTE — ED Notes (Signed)
C/o cp, radiation left arm, nauseated

## 2011-10-01 NOTE — ED Notes (Signed)
Pt. Reports chest pain since this morning. Pt. Reports chills. Chest pain radiating down to LUQ. Pt. Reports arm numbness earlier this morning. 1 episode of vomiting. Lightheadedness, headache, dizziness. Pt. Has pacemaker for 1 year. Pt  Has not had anything to eat today.  A.O. X 4 .

## 2011-10-01 NOTE — ED Notes (Addendum)
Pt. Reports chest pain that started this morning. "It comes and goes".  Pt. Reports SOB, dizziness, N/V. Pt. Reports cold app 1 week ago. Saw PCP had antibiotics.  A.O. X 4

## 2011-10-01 NOTE — ED Provider Notes (Signed)
History     CSN: [id number]  Arrival date & time 10/01/11  0906   First MD Initiated Contact with Patient 10/01/11 0935      Chief Complaint  Patient presents with  . Chest Pain    (Consider location/radiation/quality/duration/timing/severity/associated sxs/prior treatment) HPI Comments: Pt reports ate chili last night but was feeling well, slept well.  Woke up this AM feeling nauseated.  He deveoped pain in left abdomen and radiated up and down to lower left chest region and associated with tingling and numbness to left arm without any weakness anywhere.  Pain lasted 10 seconds per pt, famil thinks it lasted minutes, but is resolved now.  Felt nausea and vomited once.  His numbness is improved and overall feels improved, but still a little nauseated and feels chilled.  No known fevers.  He did not feel like eating and has not yet.  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  Followed by [facility] urgent care and Dr. Royann Shivers with [facility].  Old records indicate a h/o thyroid problems and prior cocaine use.  Pt does not admit to any drug use to me today.  Was otherwise in usual state of health.  Old records shows pt has come to the ED multiple times in the past for similar symptoms of lightheaded, nausea, vague abd pains.  Pt has had CT of abd in 8/11, and U/S in 2012, all neg except possibly fatty liver.  Pt did have a mild dry cough and congestion, seen at urgent care last week, received IM abx and a prescription for abx, however he developed N/V after first dose so quit taking.  Coughing is improved, but no resolved.    Patient is a 61 y.o. male presenting with chest pain. The history is provided by the patient, a relative and medical records.  Chest Pain Primary symptoms include abdominal pain, nausea and vomiting. Pertinent negatives for primary symptoms include no fever, no shortness of breath and no dizziness.  Associated symptoms include numbness.  Pertinent negatives for associated  symptoms include no weakness.     Past Medical History  Diagnosis Date  . Hypertension   . Arthritis   . Gout   . Chronic pain     Past Surgical History  Procedure Date  . Pacemaker insertion     History reviewed. No pertinent family history.  History  Substance Use Topics  . Smoking status: Never Smoker   . Smokeless tobacco: Not on file  . Alcohol Use: No      Review of Systems  Constitutional: Positive for chills. Negative for fever.  HENT: Negative for neck pain.   Respiratory: Negative for chest tightness and shortness of breath.   Cardiovascular: Positive for chest pain.  Gastrointestinal: Positive for nausea, vomiting and abdominal pain. Negative for diarrhea, constipation and blood in stool.  Musculoskeletal: Negative for back pain and arthralgias.  Neurological: Positive for numbness. Negative for dizziness, syncope, weakness and light-headedness.    Allergies  Aspirin  Home Medications   Current Outpatient Rx  Name Route Sig Dispense Refill  . ATENOLOL 25 MG PO TABS Oral Take 25 mg by mouth daily.    Marland Kitchen ESOMEPRAZOLE MAGNESIUM 40 MG PO CPDR Oral Take 40 mg by mouth daily before breakfast.    . HYDROCODONE-ACETAMINOPHEN 5-325 MG PO TABS Oral Take 1 tablet by mouth every 6 (six) hours as needed. For  pain    . NAPROXEN SODIUM 220 MG PO TABS Oral Take 220 mg by  mouth every 12 (twelve) hours as needed. For pain.    Marland Kitchen PREDNISONE 5 MG PO TABS Oral Take 5 mg by mouth daily.    . ALBUTEROL SULFATE HFA 108 (90 BASE) MCG/ACT IN AERS Inhalation Inhale 2 puffs into the lungs every 6 (six) hours as needed for wheezing or shortness of breath. 1 Inhaler 0  . AZITHROMYCIN 250 MG PO TABS Oral Take 1 tablet (250 mg total) by mouth daily. 4 tablet 0    BP 100/61  Pulse 60  Temp(Src) 98.8 F (37.1 C) (Oral)  Resp 12  SpO2 95%  Physical Exam  Nursing note and vitals reviewed. Constitutional: He is oriented to person, place, and time. He appears well-developed and  well-nourished. No distress.  HENT:  Head: Normocephalic and atraumatic.  Eyes: Pupils are equal, round, and reactive to light. No scleral icterus.  Cardiovascular: Normal rate.   No murmur heard. Pulmonary/Chest: Effort normal. No respiratory distress. He has no wheezes. He has no rales. He exhibits no tenderness.  Abdominal: Soft. Bowel sounds are normal. He exhibits no distension. There is no tenderness. There is no rebound.  Neurological: He is alert and oriented to person, place, and time. Coordination normal.  Skin: Skin is warm and dry. No rash noted. He is not diaphoretic. No pallor.  Psychiatric: He has a normal mood and affect.    ED Course  Procedures (including critical care time)  Labs Reviewed  COMPREHENSIVE METABOLIC PANEL - Abnormal; Notable for the following:    Glucose, Bld 112 (*)    All other components within normal limits  CBC - Abnormal; Notable for the following:    WBC 11.3 (*)    HCT 38.2 (*)    Platelets 136 (*)    All other components within normal limits  DIFFERENTIAL - Abnormal; Notable for the following:    Neutrophils Relative 91 (*)    Neutro Abs 10.3 (*)    Lymphocytes Relative 6 (*)    All other components within normal limits  TROPONIN I  LIPASE, BLOOD  URINALYSIS, ROUTINE W REFLEX MICROSCOPIC  URINE RAPID DRUG SCREEN (HOSP PERFORMED)   Dg Chest 2 View  10/01/2011  *RADIOLOGY REPORT*  Clinical Data: Cough, weakness, shortness of breath  CHEST - 2 VIEW  Comparison: 03/18/2011  Findings: Patchy opacity in the right mid lung, suspicious for right lower lobe pneumonia when correlating with the lateral view. No pleural effusion or pneumothorax.  Cardiomediastinal silhouette is within normal limits.  Left subclavian pacemaker.  Degenerative changes of the visualized thoracolumbar spine.  IMPRESSION: Patchy right lower lobe opacity, suspicious for pneumonia.  Follow-up chest radiographs are suggested to document resolution.  Original Report  Authenticated By: Charline Bills, M.D.     1. Community acquired pneumonia     ECG at time [phone] shows NSR, normal axis, no ST or T wave abn's.  Pacing spikes no longer seen compared to ECG from 01/13/10.   12:35 PM Pt feels improved, no CP or abd pain.  Pt told of CXR findings.  Will put on Rx for zithromax.   Pt knows to follow up with PCP   MDM  I reviewed pt's prior records as well as CT and U/S's.  Pt's CP is resolved and lasted a few seconds, making ACS much less likely.  ECG shows no acute ischemia.  Will get CXR and give zofran for symptoms.  Pt is taking Nexium already per family.          Gavin Pound. Lucillie Kiesel,  MD 10/01/11 1238

## 2011-10-01 NOTE — Discharge Instructions (Signed)

## 2011-10-17 ENCOUNTER — Encounter (HOSPITAL_COMMUNITY)
Admission: RE | Admit: 2011-10-17 | Discharge: 2011-10-17 | Disposition: A | Discharge status: Home / Self Care | Payer: Medicaid Other | Source: Ambulatory Visit | Attending: Internal Medicine | Admitting: Internal Medicine

## 2011-10-17 DIAGNOSIS — E0590 Thyrotoxicosis, unspecified without thyrotoxic crisis or storm: Secondary | ICD-10-CM | POA: Insufficient documentation

## 2011-10-17 DIAGNOSIS — E0500 Thyrotoxicosis with diffuse goiter without thyrotoxic crisis or storm: Secondary | ICD-10-CM

## 2011-10-17 MED ORDER — SODIUM IODIDE I 131 CAPSULE
31.7000 | Freq: Once | INTRAVENOUS | Status: AC | PRN
  Administered 2011-10-17: 31.7 via ORAL

## 2011-10-21 IMAGING — CR DG CHEST 2V
2 series · 2 of 2 positions shown · non-contrast
Comparison: 12/07/2009

Addendum Begins

The density in the left lung base could represent a nipple shadow.
Similar density was present on the radiograph from 07/14/2006.
Addendum Ends
CLINICAL DATA: Chills and high blood pressure.
CHEST - 2 VIEW

[w chest pa]
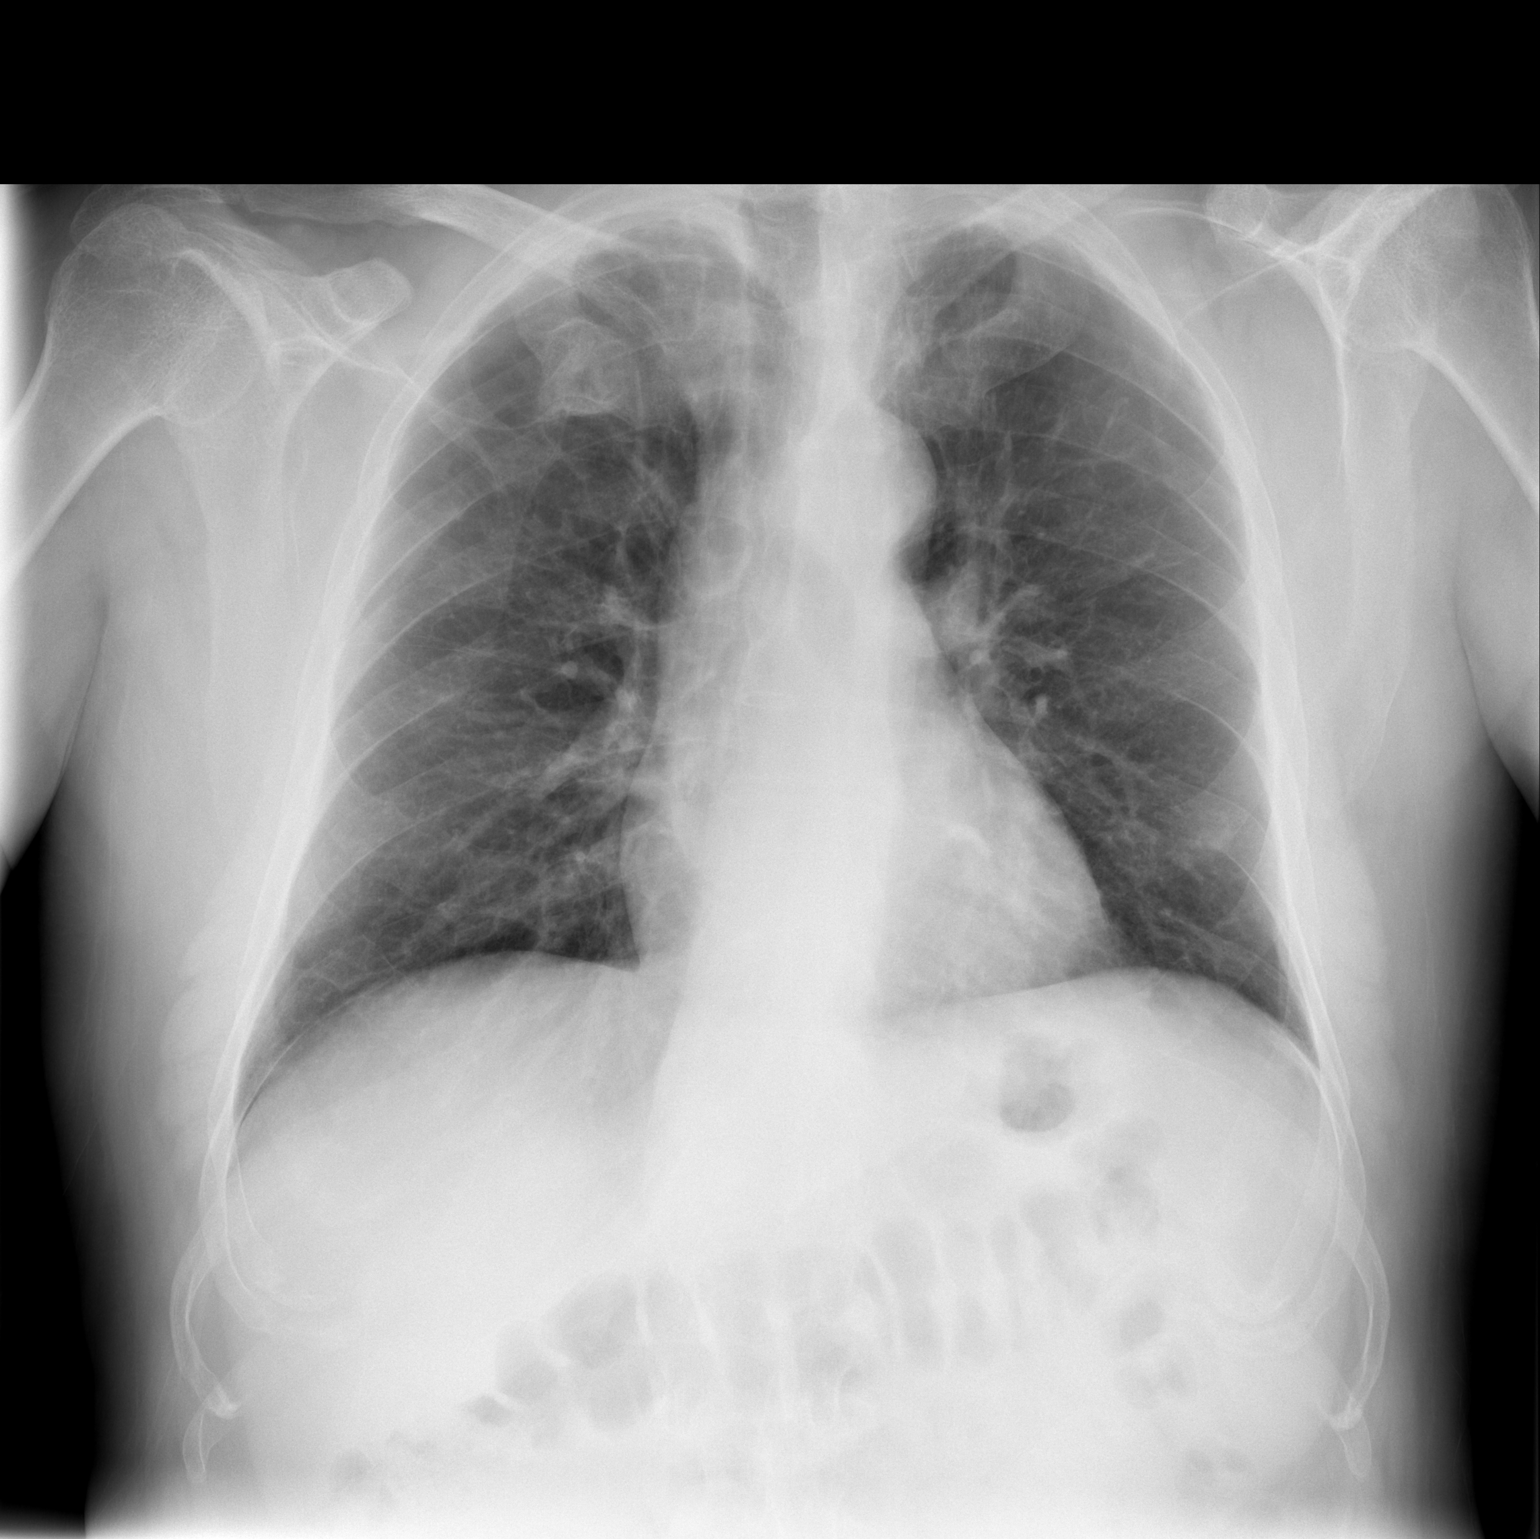

[w chest lat]
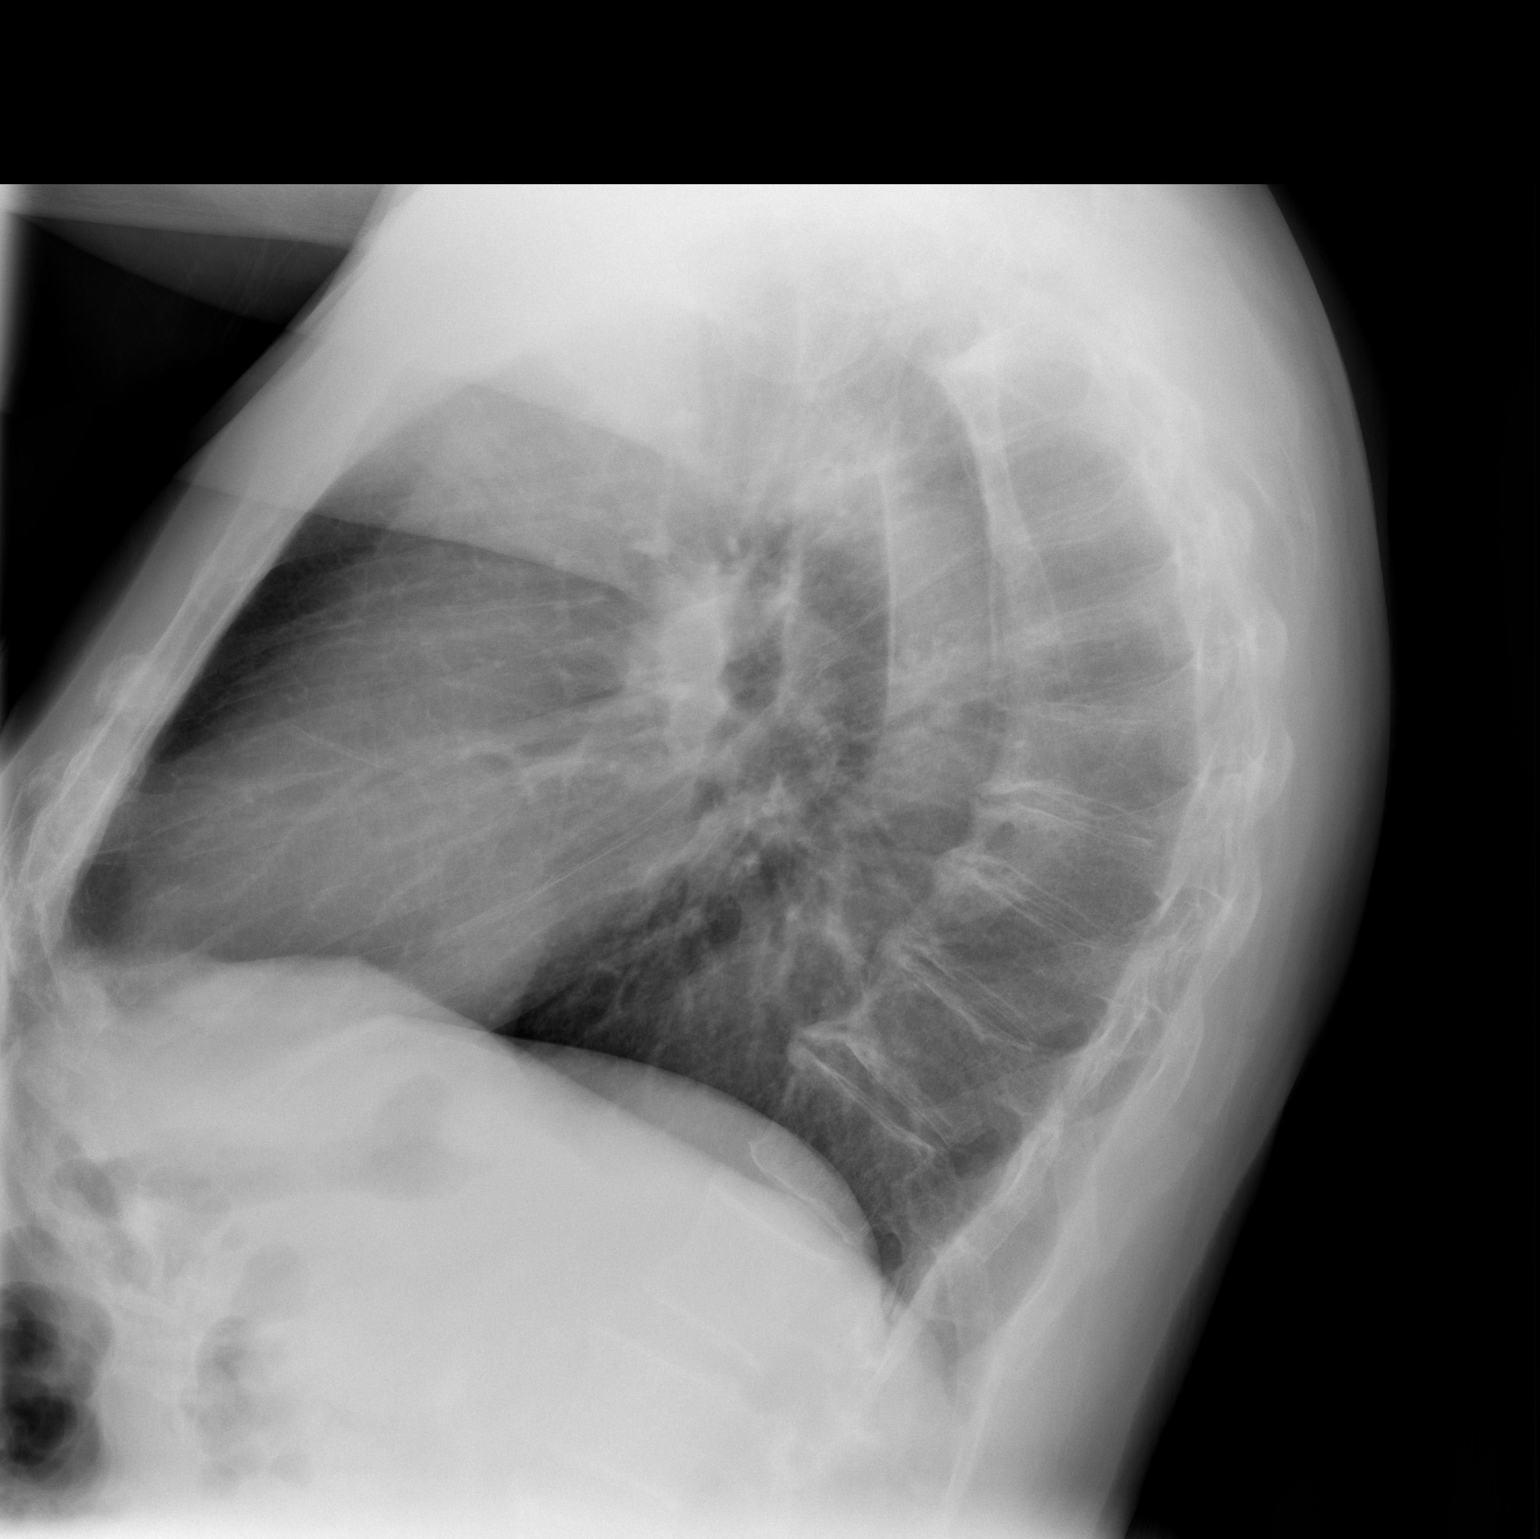

[2 of 2 positions shown; findings below may reference images not displayed]

FINDINGS: Two views of the chest were obtained.  Lungs are clear
without focal airspace disease.  Few densities in the left lung
base are probably related to lung markings and bone.  Heart and
mediastinum are stable.  The trachea is midline.  Osseous
structures are intact.
IMPRESSION: No acute chest findings.

## 2011-11-22 ENCOUNTER — Other Ambulatory Visit: Payer: Self-pay | Admitting: Orthopedic Surgery

## 2011-11-26 ENCOUNTER — Encounter (HOSPITAL_COMMUNITY): Payer: Self-pay | Admitting: Respiratory Therapy

## 2011-12-01 NOTE — Pre-Procedure Instructions (Addendum)
20 JKAI ARWOOD  12/01/2011   Your procedure is scheduled on:  Mon, July 29 @ 7:30AM  Report to [facility] at 5:30 AM.  Call this number if you have problems the morning of surgery: [phone]   Remember:   Do not eat food or drink:After Midnight.  Take these medicines the morning of surgery with A SIP OF WATER: Albuterol<Bring Your Inhaler With You>,Atenolol(Tenormin)Esomeprazole(Nexium),Prednisone(Deltasone),and Pain Pill(if needed)  stop naproxen  Do not wear jewelry  Do not wear lotions, powders, or colognes  Men may shave face and neck.  Do not bring valuables to the hospital.  Contacts, dentures or bridgework may not be worn into surgery.  Leave suitcase in the car. After surgery it may be brought to your room.  For patients admitted to the hospital, checkout time is 11:00 AM the day of discharge. Contact nancy thompson [phone]  Patients discharged the day of surgery will not be allowed to drive home.  Special Instructions: CHG Shower Use Special Wash: 1/2 bottle night before surgery and 1/2 bottle morning of surgery.   Please read over the following fact sheets that you were given: Pain Booklet, Coughing and Deep Breathing, MRSA Information and Surgical Site Infection Prevention

## 2011-12-03 ENCOUNTER — Encounter (HOSPITAL_COMMUNITY)
Admission: RE | Admit: 2011-12-03 | Discharge: 2011-12-03 | Disposition: A | Discharge status: Home / Self Care | Payer: Medicaid Other | Source: Ambulatory Visit | Attending: Orthopedic Surgery | Admitting: Orthopedic Surgery

## 2011-12-03 ENCOUNTER — Other Ambulatory Visit: Payer: Self-pay | Admitting: Orthopedic Surgery

## 2011-12-03 ENCOUNTER — Encounter (HOSPITAL_COMMUNITY): Payer: Self-pay

## 2011-12-03 ENCOUNTER — Ambulatory Visit (HOSPITAL_COMMUNITY)
Admission: RE | Admit: 2011-12-03 | Discharge: 2011-12-03 | Disposition: A | Discharge status: Home / Self Care | Payer: Medicaid Other | Source: Ambulatory Visit | Attending: Orthopedic Surgery | Admitting: Orthopedic Surgery

## 2011-12-03 DIAGNOSIS — Z01818 Encounter for other preprocedural examination: Secondary | ICD-10-CM | POA: Insufficient documentation

## 2011-12-03 DIAGNOSIS — Z950 Presence of cardiac pacemaker: Secondary | ICD-10-CM | POA: Insufficient documentation

## 2011-12-03 DIAGNOSIS — Z01812 Encounter for preprocedural laboratory examination: Secondary | ICD-10-CM | POA: Insufficient documentation

## 2011-12-03 HISTORY — DX: Cardiac arrhythmia, unspecified: I49.9

## 2011-12-03 HISTORY — DX: Thyrotoxicosis, unspecified without thyrotoxic crisis or storm: E05.90

## 2011-12-03 HISTORY — DX: Pneumonia, unspecified organism: J18.9

## 2011-12-03 HISTORY — DX: Bradycardia, unspecified: R00.1

## 2011-12-03 HISTORY — DX: Gastro-esophageal reflux disease without esophagitis: K21.9

## 2011-12-03 HISTORY — DX: Presence of cardiac pacemaker: Z95.0

## 2011-12-03 LAB — ABO/RH: ABO/RH(D): O NEG

## 2011-12-03 LAB — CBC
HCT: 41.1 % (ref 39.0–52.0)
Hemoglobin: 14.3 g/dL (ref 13.0–17.0)
MCH: 28.5 pg (ref 26.0–34.0)
MCHC: 34.8 g/dL (ref 30.0–36.0)
MCV: 81.9 fL (ref 78.0–100.0)
Platelets: 137 10*3/uL — ABNORMAL LOW (ref 150–400)
RBC: 5.02 MIL/uL (ref 4.22–5.81)
RDW: 13.6 % (ref 11.5–15.5)
WBC: 5.1 10*3/uL (ref 4.0–10.5)

## 2011-12-03 LAB — URINALYSIS, ROUTINE W REFLEX MICROSCOPIC
Bilirubin Urine: NEGATIVE
Glucose, UA: NEGATIVE mg/dL
Hgb urine dipstick: NEGATIVE
Ketones, ur: NEGATIVE mg/dL
Leukocytes, UA: NEGATIVE
Nitrite: NEGATIVE
Protein, ur: NEGATIVE mg/dL
Specific Gravity, Urine: 1.018 (ref 1.005–1.030)
Urobilinogen, UA: 0.2 mg/dL (ref 0.0–1.0)
pH: 6.5 (ref 5.0–8.0)

## 2011-12-03 LAB — COMPREHENSIVE METABOLIC PANEL
ALT: 16 U/L (ref 0–53)
AST: 14 U/L (ref 0–37)
Albumin: 4.3 g/dL (ref 3.5–5.2)
Alkaline Phosphatase: 103 U/L (ref 39–117)
BUN: 11 mg/dL (ref 6–23)
CO2: 31 mEq/L (ref 19–32)
Calcium: 10.6 mg/dL — ABNORMAL HIGH (ref 8.4–10.5)
Chloride: 100 mEq/L (ref 96–112)
Creatinine, Ser: 0.88 mg/dL (ref 0.50–1.35)
GFR calc Af Amer: >90 mL/min (ref >90)
GFR calc non Af Amer: >90 mL/min (ref >90)
Glucose, Bld: 119 mg/dL — ABNORMAL HIGH (ref 70–99)
Potassium: 3.8 mEq/L (ref 3.5–5.1)
Sodium: 141 mEq/L (ref 135–145)
Total Bilirubin: 1.3 mg/dL — ABNORMAL HIGH (ref 0.3–1.2)
Total Protein: 7.6 g/dL (ref 6.0–8.3)

## 2011-12-03 LAB — DIFFERENTIAL
Basophils Absolute: 0 10*3/uL (ref 0.0–0.1)
Basophils Relative: 0 % (ref 0–1)
Eosinophils Absolute: 0 10*3/uL (ref 0.0–0.7)
Eosinophils Relative: 0 % (ref 0–5)
Lymphocytes Relative: 29 % (ref 12–46)
Lymphs Abs: 1.5 10*3/uL (ref 0.7–4.0)
Monocytes Absolute: 0.3 10*3/uL (ref 0.1–1.0)
Monocytes Relative: 5 % (ref 3–12)
Neutro Abs: 3.3 10*3/uL (ref 1.7–7.7)
Neutrophils Relative %: 66 % (ref 43–77)

## 2011-12-03 LAB — APTT: aPTT: 29 seconds (ref 24–37)

## 2011-12-03 LAB — TYPE AND SCREEN
ABO/RH(D): O NEG
Antibody Screen: NEGATIVE

## 2011-12-03 LAB — SURGICAL PCR SCREEN
MRSA, PCR: NEGATIVE
Staphylococcus aureus: NEGATIVE

## 2011-12-03 LAB — PROTIME-INR
INR: 0.92 (ref 0.00–1.49)
Prothrombin Time: 12.6 seconds (ref 11.6–15.2)

## 2011-12-03 MED ORDER — CHLORHEXIDINE GLUCONATE 4 % EX LIQD: 60.0000 mL | Freq: Once | CUTANEOUS | Status: DC

## 2011-12-03 NOTE — Progress Notes (Addendum)
Pacer fax sent to [facility] dr Garen Lah. [facility] req'd notes, ekg from [facility]  Dr Davy Pique.  They will also send nuclear stress from 2/13. Spoke with lynn in med rec ekg in epic from 5/13

## 2011-12-04 LAB — URINE CULTURE
Colony Count: NO GROWTH
Culture: NO GROWTH

## 2011-12-05 NOTE — Consult Note (Addendum)
Anesthesia Chart Review:  Patient is a 61 year old male posted for a right TKA on 12/10/11 by Dr. Sherlean Foot.  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  His Cardiologist is Dr. Royann Shivers [facility]).  Last visit in August 2012.    EKG on 10/03/11 showed NSR.  Echo on 01/09/10 showed: Left ventricle: The cavity size was normal. Systolic function was normal. The estimated ejection fraction was in the range of 55% to 60%. Wall motion was normal; there were no regional wall motion abnormalities.  Stress test on 07/05/10 showed moderate perfusion defect in the septal region consistent with attenuation artifact, no evidence of inducible ischemia, EF 58%, low risk scan.  CXR on 12/03/11 showed no active cardiopulmonary disease. Interval clearing of right lower lobe infiltrate.  Labs noted.  PLT 137, stable.  PT/PTT WNL.  Nursing staff to follow-up on intraoperative PPM Rx form from [facility].  Shonna Chock, PA-C

## 2011-12-09 MED ORDER — CEFAZOLIN SODIUM-DEXTROSE 2-3 GM-% IV SOLR
2.0000 g | INTRAVENOUS | Status: AC
  Administered 2011-12-10: 2 g via INTRAVENOUS
  Filled 2011-12-09: qty 50

## 2011-12-10 ENCOUNTER — Encounter (HOSPITAL_COMMUNITY)
Admission: RE | Disposition: A | Discharge status: Home / Self Care | Payer: Self-pay | Source: Ambulatory Visit | Attending: Orthopedic Surgery

## 2011-12-10 ENCOUNTER — Encounter (HOSPITAL_COMMUNITY): Payer: Self-pay | Admitting: Vascular Surgery

## 2011-12-10 ENCOUNTER — Inpatient Hospital Stay (HOSPITAL_COMMUNITY)
Admission: RE | Admit: 2011-12-10 | Discharge: 2011-12-12 | DRG: 470 | Disposition: A | Discharge status: Home / Self Care | Payer: Medicaid Other | Source: Ambulatory Visit | Attending: Orthopedic Surgery | Admitting: Orthopedic Surgery

## 2011-12-10 ENCOUNTER — Encounter (HOSPITAL_COMMUNITY): Payer: Self-pay | Admitting: *Deleted

## 2011-12-10 ENCOUNTER — Ambulatory Visit (HOSPITAL_COMMUNITY): Payer: Medicaid Other | Admitting: Vascular Surgery

## 2011-12-10 DIAGNOSIS — IMO0002 Reserved for concepts with insufficient information to code with codable children: Principal | ICD-10-CM | POA: Diagnosis present

## 2011-12-10 DIAGNOSIS — K219 Gastro-esophageal reflux disease without esophagitis: Secondary | ICD-10-CM | POA: Diagnosis present

## 2011-12-10 DIAGNOSIS — D62 Acute posthemorrhagic anemia: Secondary | ICD-10-CM | POA: Diagnosis not present

## 2011-12-10 DIAGNOSIS — Z79899 Other long term (current) drug therapy: Secondary | ICD-10-CM

## 2011-12-10 DIAGNOSIS — M1710 Unilateral primary osteoarthritis, unspecified knee: Principal | ICD-10-CM | POA: Diagnosis present

## 2011-12-10 DIAGNOSIS — M109 Gout, unspecified: Secondary | ICD-10-CM | POA: Diagnosis present

## 2011-12-10 DIAGNOSIS — G8929 Other chronic pain: Secondary | ICD-10-CM | POA: Diagnosis present

## 2011-12-10 DIAGNOSIS — Z950 Presence of cardiac pacemaker: Secondary | ICD-10-CM

## 2011-12-10 DIAGNOSIS — E0500 Thyrotoxicosis with diffuse goiter without thyrotoxic crisis or storm: Secondary | ICD-10-CM | POA: Diagnosis present

## 2011-12-10 DIAGNOSIS — M1711 Unilateral primary osteoarthritis, right knee: Secondary | ICD-10-CM

## 2011-12-10 DIAGNOSIS — I10 Essential (primary) hypertension: Secondary | ICD-10-CM | POA: Diagnosis present

## 2011-12-10 HISTORY — PX: TOTAL KNEE ARTHROPLASTY: SHX125

## 2011-12-10 SURGERY — ARTHROPLASTY, KNEE, TOTAL
Anesthesia: Regional | Site: Knee | Laterality: Right | Wound class: Clean

## 2011-12-10 MED ORDER — LACTATED RINGERS IV SOLN
INTRAVENOUS | Status: DC | PRN
  Administered 2011-12-10 (×2): via INTRAVENOUS

## 2011-12-10 MED ORDER — DIPHENHYDRAMINE HCL 12.5 MG/5ML PO ELIX: 12.5000 mg | ORAL_SOLUTION | ORAL | Status: DC | PRN

## 2011-12-10 MED ORDER — DOCUSATE SODIUM 100 MG PO CAPS
100.0000 mg | ORAL_CAPSULE | Freq: Two times a day (BID) | ORAL | Status: DC
  Administered 2011-12-10 – 2011-12-12 (×5): 100 mg via ORAL
  Filled 2011-12-10 (×6): qty 1

## 2011-12-10 MED ORDER — BUPIVACAINE ON-Q PAIN PUMP (FOR ORDER SET NO CHG)
INJECTION | Status: DC
  Filled 2011-12-10: qty 1

## 2011-12-10 MED ORDER — METHOCARBAMOL 500 MG PO TABS
500.0000 mg | ORAL_TABLET | Freq: Four times a day (QID) | ORAL | Status: DC | PRN
  Administered 2011-12-11: 500 mg via ORAL
  Filled 2011-12-10 (×3): qty 1

## 2011-12-10 MED ORDER — ONDANSETRON HCL 4 MG/2ML IJ SOLN: 4.0000 mg | Freq: Four times a day (QID) | INTRAMUSCULAR | Status: DC | PRN

## 2011-12-10 MED ORDER — ACETAMINOPHEN 10 MG/ML IV SOLN
1000.0000 mg | Freq: Four times a day (QID) | INTRAVENOUS | Status: AC
  Administered 2011-12-10 – 2011-12-11 (×4): 1000 mg via INTRAVENOUS
  Filled 2011-12-10 (×6): qty 100

## 2011-12-10 MED ORDER — MIDAZOLAM HCL 5 MG/5ML IJ SOLN
INTRAMUSCULAR | Status: DC | PRN
  Administered 2011-12-10: 2 mg via INTRAVENOUS

## 2011-12-10 MED ORDER — HYDROMORPHONE HCL PF 1 MG/ML IJ SOLN
1.0000 mg | INTRAMUSCULAR | Status: DC | PRN
  Administered 2011-12-10 – 2011-12-11 (×2): 1 mg via INTRAVENOUS
  Filled 2011-12-10 (×2): qty 1

## 2011-12-10 MED ORDER — MENTHOL 3 MG MT LOZG: 1.0000 | LOZENGE | OROMUCOSAL | Status: DC | PRN

## 2011-12-10 MED ORDER — FLEET ENEMA 7-19 GM/118ML RE ENEM: 1.0000 | ENEMA | Freq: Once | RECTAL | Status: AC | PRN

## 2011-12-10 MED ORDER — PANTOPRAZOLE SODIUM 40 MG PO TBEC
80.0000 mg | DELAYED_RELEASE_TABLET | Freq: Every day | ORAL | Status: DC
  Administered 2011-12-11: 80 mg via ORAL
  Filled 2011-12-10: qty 2

## 2011-12-10 MED ORDER — METOCLOPRAMIDE HCL 5 MG/ML IJ SOLN
5.0000 mg | Freq: Three times a day (TID) | INTRAMUSCULAR | Status: DC | PRN
Start: 2011-12-10 — End: 2011-12-12

## 2011-12-10 MED ORDER — ALUM & MAG HYDROXIDE-SIMETH 200-200-20 MG/5ML PO SUSP: 30.0000 mL | ORAL | Status: DC | PRN

## 2011-12-10 MED ORDER — HYDROMORPHONE HCL PF 1 MG/ML IJ SOLN
INTRAMUSCULAR | Status: AC
  Filled 2011-12-10: qty 1

## 2011-12-10 MED ORDER — BISACODYL 5 MG PO TBEC: 5.0000 mg | DELAYED_RELEASE_TABLET | Freq: Every day | ORAL | Status: DC | PRN

## 2011-12-10 MED ORDER — OXYCODONE HCL 5 MG PO TABS
5.0000 mg | ORAL_TABLET | ORAL | Status: DC | PRN
  Administered 2011-12-10 – 2011-12-12 (×4): 10 mg via ORAL
  Filled 2011-12-10 (×4): qty 2

## 2011-12-10 MED ORDER — METOCLOPRAMIDE HCL 10 MG PO TABS: 5.0000 mg | ORAL_TABLET | Freq: Three times a day (TID) | ORAL | Status: DC | PRN

## 2011-12-10 MED ORDER — BUPIVACAINE-EPINEPHRINE PF 0.5-1:200000 % IJ SOLN
INTRAMUSCULAR | Status: DC | PRN
  Administered 2011-12-10: 30 mL

## 2011-12-10 MED ORDER — PREDNISONE 10 MG PO TABS
10.0000 mg | ORAL_TABLET | Freq: Every day | ORAL | Status: DC
  Administered 2011-12-10 – 2011-12-12 (×3): 10 mg via ORAL
  Filled 2011-12-10 (×3): qty 1

## 2011-12-10 MED ORDER — CELECOXIB 200 MG PO CAPS
200.0000 mg | ORAL_CAPSULE | Freq: Two times a day (BID) | ORAL | Status: DC
  Administered 2011-12-10 – 2011-12-12 (×5): 200 mg via ORAL
  Filled 2011-12-10 (×6): qty 1

## 2011-12-10 MED ORDER — BUPIVACAINE-EPINEPHRINE 0.25% -1:200000 IJ SOLN
INTRAMUSCULAR | Status: DC | PRN
  Administered 2011-12-10: 20 mL

## 2011-12-10 MED ORDER — CEFAZOLIN SODIUM 1-5 GM-% IV SOLN
1.0000 g | Freq: Four times a day (QID) | INTRAVENOUS | Status: AC
  Administered 2011-12-10 (×2): 1 g via INTRAVENOUS
  Filled 2011-12-10 (×2): qty 50

## 2011-12-10 MED ORDER — LIDOCAINE HCL (CARDIAC) 20 MG/ML IV SOLN
INTRAVENOUS | Status: DC | PRN
  Administered 2011-12-10: 100 mg via INTRAVENOUS

## 2011-12-10 MED ORDER — ONDANSETRON HCL 4 MG/2ML IJ SOLN: 4.0000 mg | Freq: Once | INTRAMUSCULAR | Status: DC | PRN

## 2011-12-10 MED ORDER — BUPIVACAINE-EPINEPHRINE PF 0.25-1:200000 % IJ SOLN
INTRAMUSCULAR | Status: AC
  Filled 2011-12-10: qty 30

## 2011-12-10 MED ORDER — SODIUM CHLORIDE 0.9 % IR SOLN
Status: DC | PRN
  Administered 2011-12-10: 3000 mL

## 2011-12-10 MED ORDER — HYDROMORPHONE HCL PF 1 MG/ML IJ SOLN
0.2500 mg | INTRAMUSCULAR | Status: DC | PRN
  Administered 2011-12-10 (×2): 0.5 mg via INTRAVENOUS

## 2011-12-10 MED ORDER — ZOLPIDEM TARTRATE 5 MG PO TABS: 5.0000 mg | ORAL_TABLET | Freq: Every evening | ORAL | Status: DC | PRN

## 2011-12-10 MED ORDER — PHENYLEPHRINE HCL 10 MG/ML IJ SOLN
20.0000 mg | INTRAVENOUS | Status: DC | PRN
  Administered 2011-12-10: 20 ug/min via INTRAVENOUS

## 2011-12-10 MED ORDER — ENOXAPARIN SODIUM 30 MG/0.3ML ~~LOC~~ SOLN
30.0000 mg | Freq: Two times a day (BID) | SUBCUTANEOUS | Status: DC
  Administered 2011-12-10 – 2011-12-12 (×4): 30 mg via SUBCUTANEOUS
  Filled 2011-12-10 (×5): qty 0.3

## 2011-12-10 MED ORDER — ATENOLOL 25 MG PO TABS
25.0000 mg | ORAL_TABLET | Freq: Every day | ORAL | Status: DC
Start: 2011-12-11 — End: 2011-12-12
  Administered 2011-12-11 – 2011-12-12 (×2): 25 mg via ORAL
  Filled 2011-12-10 (×2): qty 1

## 2011-12-10 MED ORDER — ONDANSETRON HCL 4 MG PO TABS: 4.0000 mg | ORAL_TABLET | Freq: Four times a day (QID) | ORAL | Status: DC | PRN

## 2011-12-10 MED ORDER — ACETAMINOPHEN 650 MG RE SUPP: 650.0000 mg | Freq: Four times a day (QID) | RECTAL | Status: DC | PRN

## 2011-12-10 MED ORDER — OXYCODONE HCL 10 MG PO TB12
10.0000 mg | ORAL_TABLET | Freq: Two times a day (BID) | ORAL | Status: DC
  Administered 2011-12-10 – 2011-12-11 (×4): 10 mg via ORAL
  Filled 2011-12-10 (×5): qty 1

## 2011-12-10 MED ORDER — SENNOSIDES-DOCUSATE SODIUM 8.6-50 MG PO TABS: 1.0000 | ORAL_TABLET | Freq: Every evening | ORAL | Status: DC | PRN

## 2011-12-10 MED ORDER — ACETAMINOPHEN 10 MG/ML IV SOLN
INTRAVENOUS | Status: AC
  Filled 2011-12-10: qty 100

## 2011-12-10 MED ORDER — ACETAMINOPHEN 10 MG/ML IV SOLN
1000.0000 mg | Freq: Four times a day (QID) | INTRAVENOUS | Status: DC
  Administered 2011-12-10: 1000 mg via INTRAVENOUS

## 2011-12-10 MED ORDER — PHENOL 1.4 % MT LIQD: 1.0000 | OROMUCOSAL | Status: DC | PRN

## 2011-12-10 MED ORDER — SODIUM CHLORIDE 0.9 % IV SOLN
INTRAVENOUS | Status: DC
  Administered 2011-12-11: 09:00:00 via INTRAVENOUS

## 2011-12-10 MED ORDER — SODIUM CHLORIDE 0.9 % IV SOLN: INTRAVENOUS | Status: DC

## 2011-12-10 MED ORDER — SUFENTANIL CITRATE 50 MCG/ML IV SOLN
INTRAVENOUS | Status: DC | PRN
  Administered 2011-12-10 (×4): 10 ug via INTRAVENOUS
  Administered 2011-12-10 (×2): 5 ug via INTRAVENOUS

## 2011-12-10 MED ORDER — ALBUTEROL SULFATE HFA 108 (90 BASE) MCG/ACT IN AERS: 2.0000 | INHALATION_SPRAY | Freq: Four times a day (QID) | RESPIRATORY_TRACT | Status: DC | PRN

## 2011-12-10 MED ORDER — PROPOFOL 10 MG/ML IV EMUL
INTRAVENOUS | Status: DC | PRN
  Administered 2011-12-10: 140 mg via INTRAVENOUS

## 2011-12-10 MED ORDER — METHOCARBAMOL 100 MG/ML IJ SOLN
500.0000 mg | Freq: Four times a day (QID) | INTRAVENOUS | Status: DC | PRN
  Administered 2011-12-10: 500 mg via INTRAVENOUS
  Filled 2011-12-10: qty 5

## 2011-12-10 MED ORDER — BUPIVACAINE 0.25 % ON-Q PUMP SINGLE CATH 300ML
300.0000 mL | INJECTION | Status: AC
  Administered 2011-12-10: 300 mL
  Filled 2011-12-10: qty 300

## 2011-12-10 MED ORDER — ACETAMINOPHEN 325 MG PO TABS: 650.0000 mg | ORAL_TABLET | Freq: Four times a day (QID) | ORAL | Status: DC | PRN

## 2011-12-10 SURGICAL SUPPLY — 57 items
BANDAGE ESMARK 6X9 LF (GAUZE/BANDAGES/DRESSINGS) ×1 IMPLANT
BLADE SAGITTAL 13X1.27X60 (BLADE) ×2 IMPLANT
BLADE SAW SGTL 83.5X18.5 (BLADE) ×2 IMPLANT
BNDG CMPR 9X6 STRL LF SNTH (GAUZE/BANDAGES/DRESSINGS) ×1
BNDG ESMARK 6X9 LF (GAUZE/BANDAGES/DRESSINGS) ×2
BOWL SMART MIX CTS (DISPOSABLE) ×2 IMPLANT
CATH KIT ON Q 5IN SLV (PAIN MANAGEMENT) ×2 IMPLANT
CEMENT BONE SIMPLEX SPEEDSET (Cement) ×4 IMPLANT
CLOTH BEACON ORANGE TIMEOUT ST (SAFETY) ×2 IMPLANT
COVER BACK TABLE 24X17X13 BIG (DRAPES) IMPLANT
COVER SURGICAL LIGHT HANDLE (MISCELLANEOUS) ×2 IMPLANT
CUFF TOURNIQUET SINGLE 34IN LL (TOURNIQUET CUFF) ×2 IMPLANT
DRAPE EXTREMITY T 121X128X90 (DRAPE) ×2 IMPLANT
DRAPE INCISE IOBAN 66X45 STRL (DRAPES) ×4 IMPLANT
DRAPE PROXIMA HALF (DRAPES) ×2 IMPLANT
DRAPE U-SHAPE 47X51 STRL (DRAPES) ×2 IMPLANT
DRSG ADAPTIC 3X8 NADH LF (GAUZE/BANDAGES/DRESSINGS) ×2 IMPLANT
DRSG PAD ABDOMINAL 8X10 ST (GAUZE/BANDAGES/DRESSINGS) ×2 IMPLANT
DURAPREP 26ML APPLICATOR (WOUND CARE) ×4 IMPLANT
ELECT REM PT RETURN 9FT ADLT (ELECTROSURGICAL) ×2
ELECTRODE REM PT RTRN 9FT ADLT (ELECTROSURGICAL) ×1 IMPLANT
EVACUATOR 1/8 PVC DRAIN (DRAIN) ×2 IMPLANT
GLOVE BIOGEL M 7.0 STRL (GLOVE) IMPLANT
GLOVE BIOGEL PI IND STRL 7.5 (GLOVE) IMPLANT
GLOVE BIOGEL PI IND STRL 8.5 (GLOVE) ×2 IMPLANT
GLOVE BIOGEL PI INDICATOR 7.5 (GLOVE)
GLOVE BIOGEL PI INDICATOR 8.5 (GLOVE) ×2
GLOVE SURG ORTHO 8.0 STRL STRW (GLOVE) ×4 IMPLANT
GOWN PREVENTION PLUS XLARGE (GOWN DISPOSABLE) ×4 IMPLANT
GOWN STRL NON-REIN LRG LVL3 (GOWN DISPOSABLE) ×4 IMPLANT
HANDPIECE INTERPULSE COAX TIP (DISPOSABLE) ×2
HOOD PEEL AWAY FACE SHEILD DIS (HOOD) ×8 IMPLANT
KIT BASIN OR (CUSTOM PROCEDURE TRAY) ×2 IMPLANT
KIT ROOM TURNOVER OR (KITS) ×2 IMPLANT
MANIFOLD NEPTUNE II (INSTRUMENTS) ×2 IMPLANT
NEEDLE 22X1 1/2 (OR ONLY) (NEEDLE) IMPLANT
NS IRRIG 1000ML POUR BTL (IV SOLUTION) ×2 IMPLANT
PACK TOTAL JOINT (CUSTOM PROCEDURE TRAY) ×2 IMPLANT
PAD ARMBOARD 7.5X6 YLW CONV (MISCELLANEOUS) ×4 IMPLANT
PADDING CAST COTTON 6X4 STRL (CAST SUPPLIES) ×2 IMPLANT
POSITIONER HEAD PRONE TRACH (MISCELLANEOUS) ×2 IMPLANT
SET HNDPC FAN SPRY TIP SCT (DISPOSABLE) ×1 IMPLANT
SPONGE GAUZE 4X4 12PLY (GAUZE/BANDAGES/DRESSINGS) ×2 IMPLANT
STAPLER VISISTAT 35W (STAPLE) ×2 IMPLANT
SUCTION FRAZIER TIP 10 FR DISP (SUCTIONS) ×2 IMPLANT
SUT BONE WAX W31G (SUTURE) ×2 IMPLANT
SUT VIC AB 0 CTB1 27 (SUTURE) ×4 IMPLANT
SUT VIC AB 1 CT1 27 (SUTURE) ×2
SUT VIC AB 1 CT1 27XBRD ANBCTR (SUTURE) ×1 IMPLANT
SUT VIC AB 2-0 CT1 27 (SUTURE) ×4
SUT VIC AB 2-0 CT1 TAPERPNT 27 (SUTURE) ×2 IMPLANT
SUT VLOC 180 0 24IN GS25 (SUTURE) ×2 IMPLANT
SYR CONTROL 10ML LL (SYRINGE) IMPLANT
TOWEL OR 17X24 6PK STRL BLUE (TOWEL DISPOSABLE) ×2 IMPLANT
TOWEL OR 17X26 10 PK STRL BLUE (TOWEL DISPOSABLE) ×2 IMPLANT
TRAY FOLEY CATH 14FR (SET/KITS/TRAYS/PACK) ×2 IMPLANT
WATER STERILE IRR 1000ML POUR (IV SOLUTION) ×6 IMPLANT

## 2011-12-10 NOTE — Op Note (Signed)
TOTAL KNEE REPLACEMENT OPERATIVE NOTE:  12/10/2011  1:23 PM  PATIENT:  Roy Tucker  61 y.o. male  PRE-OPERATIVE DIAGNOSIS:  osteoarthritis right knee  POST-OPERATIVE DIAGNOSIS:  osteoarthritis right knee  PROCEDURE:  Procedure(s): TOTAL KNEE ARTHROPLASTY  SURGEON:  Surgeon(s): Raymon Mutton, MD  PHYSICIAN ASSISTANT: Altamese Cabal, [facility]  ANESTHESIA:   general  DRAINS: Hemovac and On-Q Marcaine Pain Pump  SPECIMEN: None  COUNTS:  Correct  TOURNIQUET:   Total Tourniquet Time Documented: Thigh (Right) - 41 minutes  DICTATION:  Indication for procedure:    The patient is a 61 y.o. male who has failed conservative treatment for osteoarthritis right knee.  Informed consent was obtained prior to anesthesia. The risks versus benefits of the operation were explain and in a way the patient can, and did, understand.   Description of procedure:     The patient was taken to the operating room and placed under anesthesia.  The patient was positioned in the usual fashion taking care that all body parts were adequately padded and/or protected.  I foley catheter was not placed.  A tourniquet was applied and the leg prepped and draped in the usual sterile fashion.  The extremity was exsanguinated with the esmarch and tourniquet inflated to 350 mmHg.  Pre-operative range of motion was normal.  The knee was in 3 degree of mild varus.  A midline incision approximately 6-7 inches long was made with a #10 blade.  A new blade was used to make a parapatellar arthrotomy going 2-3 cm into the quadriceps tendon, over the patella, and alongside the medial aspect of the patellar tendon.  A synovectomy was then performed with the #10 blade and forceps. I then elevated the deep MCL off the medial tibial metaphysis subperiosteally around to the semimembranosus attachment.    I everted the patella and used calipers to measure patellar thickness.  I used the reamer to ream down to appropriate thickness  to recreate the native thickness.  I then removed excess bone with the rongeur and sagittal saw.  I used the appropriately sized template and drilled the three lug holes.  I then put the trial in place and measured the thickness with the calipers to ensure recreation of the native thickness.  The trial was then removed and the patella subluxed and the knee brought into flexion.  A homan retractor was place to retract and protect the patella and lateral structures.  A Z-retractor was place medially to protect the medial structures.  The extra-medullary alignment system was used to make cut the tibial articular surface perpendicular to the anamotic axis of the tibia and in 3 degrees of posterior slope.  The cut surface and alignment jig was removed.  I then used the intramedullary alignment guide to make a 6 valgus cut on the distal femur.  I then marked out the epicondylar axis on the distal femur.  The posterior condylar axis measured 3 degrees.  I then used the anterior referencing sizer and measured the femur to be a size E.  The 4-In-1 cutting block was screwed into place in external rotation matching the posterior condylar angle, making our cuts perpendicular to the epicondylar axis.  Anterior, posterior and chamfer cuts were made with the sagittal saw.  The cutting block and cut pieces were removed.  A lamina spreader was placed in 90 degrees of flexion.  The ACL, PCL, menisci, and posterior condylar osteophytes were removed.  A 17 mm spacer blocked was found to offer good  flexion and extension gap balance after minimal in degree releasing.   The scoop retractor was then placed and the femoral finishing block was pinned in place.  The small sagittal saw was used as well as the lug drill to finish the femur.  The block and cut surfaces were removed and the medullary canal hole filled with autograft bone from the cut pieces.  The tibia was delivered forward in deep flexion and external rotation.  A size 6  tray was selected and pinned into place centered on the medial 1/3 of the tibial tubercle.  The reamer and keel was used to prepare the tibia through the tray.    I then trialed with the size E femur, size 6 tibia, a 17 mm insert and the 38 patella.  I had excellent flexion/extension gap balance, excellent patella tracking.  Flexion was full and beyond 120 degrees; extension was zero.  These components were chosen and the staff opened them to me on the back table while the knee was lavaged copiously and the cement mixed.  I cemented in the components and removed all excess cement.  The polyethylene tibial component was snapped into place and the knee placed in extension while cement was hardening.  The capsule was infilltrated with 20cc of .25% Marcaine with epinephrine.  A hemovac was place in the joint exiting superolaterally.  A pain pump was place superomedially superficial to the arthrotomy.  Once the cement was hard, the tourniquet was let down.  Hemostasis was obtained.  The arthrotomy was closed with figure-8 #1 vicryl sutures.  The deep soft tissues were closed with #0 vicryls and the subcuticular layer closed with a running #2-0 vicryl.  The skin was reapproximated and closed with skin staples.  The wound was dressed with xeroform, 4 x4's, 2 ABD sponges, a single layer of webril and a TED stocking.   The patient was then awakened, extubated, and taken to the recovery room in stable condition.  BLOOD LOSS:  300cc DRAINS: 1 hemovac, 1 pain catheter COMPLICATIONS:  None.  PLAN OF CARE: Admit to inpatient   PATIENT DISPOSITION:  PACU - hemodynamically stable.   Delay start of Pharmacological VTE agent (>24hrs) due to surgical blood loss or risk of bleeding:  not applicable  Please fax a copy of this op note to my office at [phone] (please only include page 1 and 2 of the Case Information op note)

## 2011-12-10 NOTE — Transfer of Care (Signed)
Immediate Anesthesia Transfer of Care Note  Patient: Roy Tucker  Procedure(s) Performed: Procedure(s) (LRB): TOTAL KNEE ARTHROPLASTY (Right)  Patient Location: PACU  Anesthesia Type: General  Level of Consciousness: awake, alert  and oriented  Airway & Oxygen Therapy: Patient Spontanous Breathing and Patient connected to face mask oxygen  Post-op Assessment: Report given to PACU RN, Post -op Vital signs reviewed and stable, Patient moving all extremities and Patient moving all extremities X 4  Post vital signs: Reviewed and stable  Complications: No apparent anesthesia complications

## 2011-12-10 NOTE — Anesthesia Procedure Notes (Signed)
Anesthesia Regional Block:  Femoral nerve block  Pre-Anesthetic Checklist: ,, timeout performed, Correct Patient, Correct Site, Correct Laterality, Correct Procedure, Correct Position, site marked, Risks and benefits discussed,  Surgical consent,  Pre-op evaluation,  At surgeon's request and post-op pain management  Laterality: Right  Prep: chloraprep       Needles:  Injection technique: Single-shot  Needle Type: Echogenic Stimulator Needle     Needle Length:cm 9 cm Needle Gauge: 21 G    Additional Needles:  Procedures: ultrasound guided and nerve stimulator Femoral nerve block  Nerve Stimulator or Paresthesia:  Response: 0.4 mA,   Additional Responses:   Narrative:  Start time: 12/10/2011 7:10 AM End time: 12/10/2011 7:25 AM Injection made incrementally with aspirations every 5 mL. Anesthesiologist: Arta Bruce MD  Additional Notes: Monitors applied. Patient sedated. Sterile prep and drape,hand hygiene and sterile gloves were used. Relevant anatomy identified.Needle position confirmed.Local anesthetic injected incrementally after negative aspiration. Local anesthetic spread visualized around nerve(s). Vascular puncture avoided. No complications. Image printed for medical record.The patient tolerated the procedure well.       Femoral nerve block

## 2011-12-10 NOTE — Anesthesia Preprocedure Evaluation (Addendum)
Anesthesia Evaluation  Patient identified by MRN, date of birth, ID band Patient awake and Patient confused    Reviewed: Allergy & Precautions, H&P , NPO status , Patient's Chart, lab work & pertinent test results, reviewed documented beta blocker date and time   History of Anesthesia Complications Negative for: history of anesthetic complications  Airway Mallampati: III TM Distance: <3 FB Neck ROM: Full    Dental  (+) Teeth Intact and Edentulous Upper   Pulmonary former smoker,  Quit 2006   Pulmonary exam normal       Cardiovascular hypertension, Pt. on medications and Pt. on home beta blockers + pacemaker Rhythm:Regular Rate:Normal  Paced   Neuro/Psych negative neurological ROS  negative psych ROS   GI/Hepatic Neg liver ROS, GERD-  Medicated and Controlled,  Endo/Other  Hyperthyroidism   Renal/GU negative Renal ROS     Musculoskeletal   Abdominal   Peds  Hematology   Anesthesia Other Findings   Reproductive/Obstetrics                          Anesthesia Physical Anesthesia Plan  ASA: III  Anesthesia Plan: General   Post-op Pain Management:    Induction: Intravenous  Airway Management Planned: LMA  Additional Equipment:   Intra-op Plan:   Post-operative Plan: Extubation in OR  Informed Consent: I have reviewed the patients History and Physical, chart, labs and discussed the procedure including the risks, benefits and alternatives for the proposed anesthesia with the patient or authorized representative who has indicated his/her understanding and acceptance.   Dental advisory given  Plan Discussed with: CRNA and Surgeon  Anesthesia Plan Comments:        Anesthesia Quick Evaluation

## 2011-12-10 NOTE — Anesthesia Postprocedure Evaluation (Signed)
Anesthesia Post Note  Patient: Roy Tucker  Procedure(s) Performed: Procedure(s) (LRB): TOTAL KNEE ARTHROPLASTY (Right)  Anesthesia type: general  Patient location: PACU  Post pain: Pain level controlled  Post assessment: Patient's Cardiovascular Status Stable  Last Vitals:  Filed Vitals:   12/10/11 1030  BP: 108/54  Pulse: 61  Temp: 36.6 C  Resp: 9    Post vital signs: Reviewed and stable  Level of consciousness: sedated  Complications: No apparent anesthesia complications

## 2011-12-10 NOTE — Progress Notes (Signed)
Orthopedic Tech Progress Note Patient Details:  Roy Tucker May 06, 1951 [id number]  CPM Right Knee CPM Right Knee: On Right Knee Flexion (Degrees): 90  Right Knee Extension (Degrees): 0    Coralyn Roselli T 12/10/2011, 10:08 AM

## 2011-12-10 NOTE — H&P (Signed)
Roy Tucker MRN:  [id number] DOB/SEX:  Jun 03, 1950/male  CHIEF COMPLAINT:  Painful right Knee  HISTORY: Patient is a 61 y.o. male presented with a history of pain in the right knee. Onset of symptoms was gradual starting several years ago with gradually worsening course since that time. The patient noted no past surgery on the right knee. Prior procedures on the knee include arthroscopy. Patient has been treated conservatively with over-the-counter NSAIDs and activity modification. Patient currently rates pain in the knee at 7 out of 10 with activity. There is pain at night.  PAST MEDICAL HISTORY: Patient Active Problem List   Diagnosis Date Noted  . Hyperthyroidism 07/24/2011  . Graves' disease 07/24/2011   Past Medical History  Diagnosis Date  . Hypertension   . Arthritis   . Gout   . Chronic pain   . Bradycardia   . Dysrhythmia     bradycardia  . Pacemaker   . Hyperthyroidism     graves on prednisone  . Pneumonia     few months ago  . GERD (gastroesophageal reflux disease)    Past Surgical History  Procedure Date  . Pacemaker insertion 11  . Colonoscopy w/ polypectomy      MEDICATIONS:   Prescriptions prior to admission  Medication Sig Dispense Refill  . albuterol (PROVENTIL HFA;VENTOLIN HFA) 108 (90 BASE) MCG/ACT inhaler Inhale 2 puffs into the lungs every 6 (six) hours as needed for wheezing or shortness of breath.  1 Inhaler  0  . atenolol (TENORMIN) 25 MG tablet Take 25 mg by mouth daily.      Marland Kitchen esomeprazole (NEXIUM) 40 MG capsule Take 40 mg by mouth daily before breakfast.      . HYDROcodone-acetaminophen (NORCO) 10-325 MG per tablet Take 1 tablet by mouth every 6 (six) hours as needed.      . naproxen sodium (ANAPROX) 220 MG tablet Take 220 mg by mouth every 12 (twelve) hours as needed. For pain.      . predniSONE (DELTASONE) 10 MG tablet Take 10 mg by mouth daily.        ALLERGIES:   Allergies  Allergen Reactions  . Aspirin Other (See Comments)   intolerance  . Methimazole (Thiamazole)     unsure    REVIEW OF SYSTEMS:  Pertinent items are noted in HPI.   FAMILY HISTORY:  History reviewed. No pertinent family history.  SOCIAL HISTORY:   History  Substance Use Topics  . Smoking status: Former Smoker -- 1.0 packs/day for 15 years    Types: Cigarettes    Quit date: 12/03/2003  . Smokeless tobacco: Not on file  . Alcohol Use: No     EXAMINATION:  Vital signs in last 24 hours: Temp:  [98.1 F (36.7 C)] 98.1 F (36.7 C) (07/29 0605) Pulse Rate:  [60] 60  (07/29 0605) Resp:  [18] 18  (07/29 0605) BP: (119)/(81) 119/81 mmHg (07/29 0605) SpO2:  [99 %] 99 % (07/29 0605)  General appearance: alert, cooperative and no distress Lungs: clear to auscultation bilaterally Heart: regular rate and rhythm, S1, S2 normal, no murmur, click, rub or gallop Abdomen: soft, non-tender; bowel sounds normal; no masses,  no organomegaly Extremities: extremities normal, atraumatic, no cyanosis or edema and Homans sign is negative, no sign of DVT Pulses: 2+ and symmetric Skin: Skin color, texture, turgor normal. No rashes or lesions Neurologic: Alert and oriented X 3, normal strength and tone. Normal symmetric reflexes. Normal coordination and gait  Musculoskeletal:  ROM 0-110, Ligaments intact,  Imaging Review Plain radiographs demonstrate severe degenerative joint disease of the right knee. The overall alignment is mild varus. The bone quality appears to be good for age and reported activity level.  Assessment/Plan: End stage arthritis, right knee   The patient history, physical examination and imaging studies are consistent with advanced degenerative joint disease of the right knee. The patient has failed conservative treatment.  The clearance notes were reviewed.  After discussion with the patient it was felt that Total Knee Replacement was indicated. The procedure,  risks, and benefits of total knee arthroplasty were presented and  reviewed. The risks including but not limited to aseptic loosening, infection, blood clots, vascular injury, stiffness, patella tracking problems complications among others were discussed. The patient acknowledged the explanation, agreed to proceed with the plan.  Daniel Johndrow 12/10/2011, 7:19 AM

## 2011-12-11 ENCOUNTER — Encounter (HOSPITAL_COMMUNITY): Payer: Self-pay | Admitting: Orthopedic Surgery

## 2011-12-11 LAB — CBC
HCT: 29.9 % — ABNORMAL LOW (ref 39.0–52.0)
Hemoglobin: 10.3 g/dL — ABNORMAL LOW (ref 13.0–17.0)
MCH: 28.6 pg (ref 26.0–34.0)
MCHC: 34.4 g/dL (ref 30.0–36.0)
MCV: 83.1 fL (ref 78.0–100.0)
Platelets: 104 10*3/uL — ABNORMAL LOW (ref 150–400)
RBC: 3.6 MIL/uL — ABNORMAL LOW (ref 4.22–5.81)
RDW: 13.9 % (ref 11.5–15.5)
WBC: 5.7 10*3/uL (ref 4.0–10.5)

## 2011-12-11 LAB — BASIC METABOLIC PANEL
BUN: 12 mg/dL (ref 6–23)
CO2: 29 mEq/L (ref 19–32)
Calcium: 8.8 mg/dL (ref 8.4–10.5)
Chloride: 101 mEq/L (ref 96–112)
Creatinine, Ser: 0.79 mg/dL (ref 0.50–1.35)
GFR calc Af Amer: >90 mL/min (ref >90)
GFR calc non Af Amer: >90 mL/min (ref >90)
Glucose, Bld: 99 mg/dL (ref 70–99)
Potassium: 4.2 mEq/L (ref 3.5–5.1)
Sodium: 136 mEq/L (ref 135–145)

## 2011-12-11 NOTE — Plan of Care (Signed)
Problem: Phase I Progression Outcomes Goal: Pain controlled with appropriate interventions Pain well controlled, sleeping well.

## 2011-12-11 NOTE — Plan of Care (Signed)
Problem: Phase I Progression Outcomes Goal: CMS/Neurovascular status WDL Good pedal pulses.

## 2011-12-11 NOTE — Progress Notes (Signed)
Physical Therapy Treatment Patient Details Name: Roy Tucker MRN: [id number] DOB: [birth year] Today's Date: 12/11/2011 Time: 1327-1400 PT Time Calculation (min): 33 min  PT Assessment / Plan / Recommendation Comments on Treatment Session  Pt able to increase ambulation and complete HEP.  Pt left on CPM 0 to 80 degrees.  Will continue to follow.    Follow Up Recommendations  Home health PT    Barriers to Discharge        Equipment Recommendations  None recommended by PT    Recommendations for Other Services    Frequency 7X/week   Plan Frequency remains appropriate;Discharge plan remains appropriate    Precautions / Restrictions Precautions Precautions: Knee Restrictions Weight Bearing Restrictions: No   Pertinent Vitals/Pain 5/10 right knee pain    Mobility  Bed Mobility Bed Mobility: Supine to Sit;Sit to Supine Supine to Sit: 4: Min guard Sit to Supine: 4: Min guard Details for Bed Mobility Assistance: Minguard with cues for proper technique Transfers Transfers: Sit to Stand;Stand to Sit Sit to Stand: 4: Min guard;With armrests Stand to Sit: 4: Min guard;To chair/3-in-1 Details for Transfer Assistance: Minguard for safety with max cues for hand and LE placement Ambulation/Gait Ambulation/Gait Assistance: 4: Min guard Ambulation Distance (Feet): 100 Feet Assistive device: Rolling walker Ambulation/Gait Assistance Details: Minguard for safety with cues for proper step sequence Gait Pattern: Step-to pattern;Decreased stride length;Shuffle;Antalgic    Exercises Total Joint Exercises Ankle Circles/Pumps: AAROM;Right;10 reps Quad Sets: AAROM;Right;10 reps Short Arc Quad: Strengthening;Right;5 reps Heel Slides: AAROM;Right;10 reps Hip ABduction/ADduction: Strengthening;Right;5 reps Straight Leg Raises: Strengthening;Right;5 reps   PT Diagnosis:    PT Problem List:   PT Treatment Interventions:     PT Goals Acute Rehab PT Goals PT Goal Formulation: With  patient Time For Goal Achievement: 12/18/11 Potential to Achieve Goals: Good Pt will go Supine/Side to Sit: with modified independence PT Goal: Supine/Side to Sit - Progress: Progressing toward goal Pt will go Sit to Supine/Side: with modified independence PT Goal: Sit to Supine/Side - Progress: Progressing toward goal Pt will go Sit to Stand: with modified independence PT Goal: Sit to Stand - Progress: Progressing toward goal Pt will go Stand to Sit: with modified independence PT Goal: Stand to Sit - Progress: Progressing toward goal Pt will Ambulate: >150 feet;with modified independence;with rolling walker PT Goal: Ambulate - Progress: Progressing toward goal Pt will Perform Home Exercise Program: Independently PT Goal: Perform Home Exercise Program - Progress: Progressing toward goal  Visit Information  Last PT Received On: 12/11/11 Assistance Needed: +1    Subjective Data  Subjective: "I'm feeling pretty good this afternoon because of the pain meds." Patient Stated Goal: To go home   Cognition  Overall Cognitive Status: Appears within functional limits for tasks assessed/performed Arousal/Alertness: Awake/alert Orientation Level: Appears intact for tasks assessed Behavior During Session: [facility] for tasks performed    Balance     End of Session PT - End of Session Equipment Utilized During Treatment: Gait belt Activity Tolerance: Patient tolerated treatment well Patient left: with call bell/phone within reach;in bed;in CPM Nurse Communication: Mobility status   GP     Makaila Windle 12/11/2011, 2:05 PM Jake Shark, PT DPT [phone]

## 2011-12-11 NOTE — Evaluation (Signed)
Physical Therapy Evaluation Patient Details Name: Roy Tucker MRN: [id number] DOB: 1950/09/12 Today's Date: 12/11/2011 Time: 3086-5784 PT Time Calculation (min): 28 min  PT Assessment / Plan / Recommendation Clinical Impression  Pt is 61 y/o male admitted for s/p right TKA.  Pt will benefit from acute PT services to improve overall mobility to prepare for safe d/c home.     PT Assessment  Patient needs continued PT services    Follow Up Recommendations  Home health PT    Barriers to Discharge        Equipment Recommendations  None recommended by PT    Recommendations for Other Services     Frequency 7X/week    Precautions / Restrictions Precautions Precautions: Knee Restrictions Weight Bearing Restrictions: No   Pertinent Vitals/Pain 8/10 right knee pain      Mobility  Bed Mobility Bed Mobility: Supine to Sit Supine to Sit: 4: Min guard Details for Bed Mobility Assistance: Minguard for safety with max cues for technique Transfers Transfers: Sit to Stand;Stand to Sit Sit to Stand: 4: Min guard;With armrests Stand to Sit: 4: Min guard;To chair/3-in-1 Details for Transfer Assistance: Minguard for safety with max cues for hand and LE placement Ambulation/Gait Ambulation/Gait Assistance: 4: Min guard Ambulation Distance (Feet): 60 Feet Assistive device: Rolling walker Ambulation/Gait Assistance Details: Minguard for safety with max cues for RW placement and proper step sequence Gait Pattern: Step-to pattern;Decreased stride length;Shuffle;Antalgic    Exercises Total Joint Exercises Ankle Circles/Pumps: AAROM;Right;10 reps Quad Sets: AAROM;Right;10 reps Heel Slides: AAROM;Right;10 reps   PT Diagnosis: Difficulty walking;Abnormality of gait;Acute pain  PT Problem List: Decreased strength;Decreased activity tolerance;Decreased range of motion;Decreased mobility;Decreased knowledge of use of DME;Pain PT Treatment Interventions: DME instruction;Gait training;Stair  training;Therapeutic activities;Therapeutic exercise;Balance training;Functional mobility training;Patient/family education   PT Goals Acute Rehab PT Goals PT Goal Formulation: With patient Time For Goal Achievement: 12/18/11 Potential to Achieve Goals: Good Pt will go Supine/Side to Sit: with modified independence PT Goal: Supine/Side to Sit - Progress: Goal set today Pt will go Sit to Supine/Side: with modified independence PT Goal: Sit to Supine/Side - Progress: Goal set today Pt will go Sit to Stand: with modified independence PT Goal: Sit to Stand - Progress: Goal set today Pt will go Stand to Sit: with modified independence PT Goal: Stand to Sit - Progress: Goal set today Pt will Ambulate: >150 feet;with modified independence;with rolling walker PT Goal: Ambulate - Progress: Goal set today Pt will Go Up / Down Stairs: 3-5 stairs;with min assist;with rolling walker PT Goal: Up/Down Stairs - Progress: Goal set today Pt will Perform Home Exercise Program: Independently PT Goal: Perform Home Exercise Program - Progress: Goal set today  Visit Information  Last PT Received On: 12/11/11 Assistance Needed: +1    Subjective Data  Subjective: "I'm doing ok.  Didn't sleep good last night." Patient Stated Goal: To go home   Prior Functioning  Home Living Lives With: Spouse Available Help at Discharge: Family Type of Home: House Home Access: Stairs to enter Secretary/administrator of Steps: 3 Entrance Stairs-Rails: None Home Layout: One level Bathroom Shower/Tub: Walk-in shower;Door [employer] Toilet: Standard Home Adaptive Equipment: Walker - rolling;Bedside commode/3-in-1 Prior Function Level of Independence: Independent Able to Take Stairs?: Yes Driving: Yes Communication Communication: No difficulties Dominant Hand: Right    Cognition  Overall Cognitive Status: Appears within functional limits for tasks assessed/performed Arousal/Alertness: Awake/alert Orientation  Level: Appears intact for tasks assessed Behavior During Session: [facility] for tasks performed    Extremity/Trunk  Assessment Right Upper Extremity Assessment RUE ROM/Strength/Tone: Within functional levels Left Upper Extremity Assessment LUE ROM/Strength/Tone: Within functional levels Right Lower Extremity Assessment RLE ROM/Strength/Tone: Deficits;Unable to fully assess;Due to pain RLE ROM/Strength/Tone Deficits: AAROM knee flexion 0-60 degrees Left Lower Extremity Assessment LLE ROM/Strength/Tone: Within functional levels   Balance    End of Session PT - End of Session Equipment Utilized During Treatment: Gait belt Activity Tolerance: Patient tolerated treatment well Patient left: in chair;with call bell/phone within reach Nurse Communication: Mobility status CPM Right Knee CPM Right Knee: Off  GP     Roy Tucker 12/11/2011, 12:59 PM Jake Shark, PT DPT [phone]

## 2011-12-11 NOTE — Plan of Care (Signed)
Problem: Consults Goal: Diagnosis- Total Joint Replacement Primary Total Knee- Left  Comments:  Hassan Rowan, RN

## 2011-12-11 NOTE — Progress Notes (Signed)
  Georgena Spurling, MD   Altamese Cabal, PA-C [address] [place], [place], [place]  [address]                             [phone]   PROGRESS NOTE  Subjective:  negative for Chest Pain  negative for Shortness of Breath  negative for Nausea/Vomiting   negative for Calf Pain  negative for Bowel Movement   Tolerating Diet: yes         Patient reports pain as 3 on 0-10 scale.    Objective: Vital signs in last 24 hours:   Patient Vitals for the past 24 hrs:  BP Temp Temp src Pulse Resp SpO2  12/11/11 1454 106/52 mmHg 98 F (36.7 C) - 62  20  99 %  12/11/11 0613 128/76 mmHg 97.7 F (36.5 C) Oral 62  20  98 %  12/11/11 0122 94/47 mmHg 98.4 F (36.9 C) Oral 60  18  97 %  12/10/11 2127 109/56 mmHg 98.2 F (36.8 C) Oral 62  18  99 %    @flow {1959:LAST@   Intake/Output from previous day:   07/29 0701 - 07/30 0700 In: 2430 [P.O.:555; I.V.:1875] Out: 3100 [Urine:2125; Drains:975]   Intake/Output this shift:   07/30 0701 - 07/30 1900 In: 720 [P.O.:720] Out: 600 [Urine:600]   Intake/Output      07/29 0701 - 07/30 0700 07/30 0701 - 07/31 0700   P.O. 555 720   I.V. 1875    Total Intake 2430 720   Urine 2125 600   Drains 975    Total Output 3100 600   Net -670 +120        Stool Occurrence 1 x       LABORATORY DATA:  Basename 12/11/11 0632  WBC 5.7  HGB 10.3*  HCT 29.9*  PLT 104*    Basename 12/11/11 0632  NA 136  K 4.2  CL 101  CO2 29  BUN 12  CREATININE 0.79  GLUCOSE 99  CALCIUM 8.8   Lab Results  Component Value Date   INR 0.92 12/03/2011   INR 0.95 01/12/2010   INR 0.95 01/08/2010    Examination:  General appearance: alert, cooperative and no distress Extremities: Homans sign is negative, no sign of DVT  Wound Exam: clean, dry, intact   Drainage:  None: wound tissue dry  Motor Exam: EHL and FHL Intact  Sensory Exam: Deep Peroneal normal  Vascular Exam:    Assessment:    1 Day Post-Op  Procedure(s) (LRB): TOTAL KNEE ARTHROPLASTY  (Right)  ADDITIONAL DIAGNOSIS:  Active Problems:  * No active hospital problems. *   Acute Blood Loss Anemia   Plan: Physical Therapy as ordered Weight Bearing as Tolerated (WBAT)  DVT Prophylaxis:  Lovenox  DISCHARGE PLAN: Home  DISCHARGE NEEDS: HHPT, CPM, Walker and 3-in-1 comode seat         Jorgina Binning 12/11/2011, 4:12 PM

## 2011-12-11 NOTE — Progress Notes (Signed)
CARE MANAGEMENT NOTE 12/11/2011  Patient:  Roy Tucker, Roy Tucker   Account Number:  [id number]  Date Initiated:  12/11/2011  Documentation initiated by:  Vance Peper  Subjective/Objective Assessment:   61 yr old male s/p right total knee arthroplasty.     Action/Plan:   CM spoke with patient and wife regarding needs at discharge. Choice offered.Patient preoperatively setup with [facility], no changes. CPM, rolling walker and 3in1 have been delivered.   Anticipated DC Date:  12/12/2011   Anticipated DC Plan:  HOME W HOME HEALTH SERVICES      DC Planning Services  CM consult      [facility] Choice  HOME HEALTH   Choice offered to / List presented to:          [facility] arranged  HH-2 PT      [facility] agency  [facility]   Status of service:  Completed, signed off   Discharge Disposition:  HOME W HOME HEALTH SERVICES

## 2011-12-12 LAB — CBC
HCT: 27.3 % — ABNORMAL LOW (ref 39.0–52.0)
Hemoglobin: 9.5 g/dL — ABNORMAL LOW (ref 13.0–17.0)
MCH: 28.6 pg (ref 26.0–34.0)
MCHC: 34.8 g/dL (ref 30.0–36.0)
MCV: 82.2 fL (ref 78.0–100.0)
Platelets: 103 10*3/uL — ABNORMAL LOW (ref 150–400)
RBC: 3.32 MIL/uL — ABNORMAL LOW (ref 4.22–5.81)
RDW: 14 % (ref 11.5–15.5)
WBC: 5.5 10*3/uL (ref 4.0–10.5)

## 2011-12-12 LAB — BASIC METABOLIC PANEL
BUN: 8 mg/dL (ref 6–23)
CO2: 27 mEq/L (ref 19–32)
Calcium: 9 mg/dL (ref 8.4–10.5)
Chloride: 101 mEq/L (ref 96–112)
Creatinine, Ser: 0.7 mg/dL (ref 0.50–1.35)
GFR calc Af Amer: >90 mL/min (ref >90)
GFR calc non Af Amer: >90 mL/min (ref >90)
Glucose, Bld: 106 mg/dL — ABNORMAL HIGH (ref 70–99)
Potassium: 4 mEq/L (ref 3.5–5.1)
Sodium: 137 mEq/L (ref 135–145)

## 2011-12-12 MED ORDER — OXYCODONE HCL 10 MG PO TB12: 10.0000 mg | ORAL_TABLET | Freq: Two times a day (BID) | ORAL | Status: DC

## 2011-12-12 MED ORDER — METHOCARBAMOL 500 MG PO TABS: 500.0000 mg | ORAL_TABLET | Freq: Four times a day (QID) | ORAL | Status: AC | PRN

## 2011-12-12 MED ORDER — OXYCODONE HCL 5 MG PO TABS: 5.0000 mg | ORAL_TABLET | ORAL | Status: AC | PRN

## 2011-12-12 MED ORDER — ENOXAPARIN SODIUM 40 MG/0.4ML ~~LOC~~ SOLN: 40.0000 mg | Freq: Every day | SUBCUTANEOUS | Status: DC

## 2011-12-12 NOTE — Progress Notes (Signed)
  Georgena Spurling, MD   Altamese Cabal, PA-C [address] [place], [place], [place]  [address]                             [phone]   PROGRESS NOTE  Subjective:  negative for Chest Pain  negative for Shortness of Breath  negative for Nausea/Vomiting   negative for Calf Pain  negative for Bowel Movement   Tolerating Diet: yes         Patient reports pain as 3 on 0-10 scale.    Objective: Vital signs in last 24 hours:   Patient Vitals for the past 24 hrs:  BP Temp Temp src Pulse Resp SpO2  12/12/11 0605 108/68 mmHg - - - - -  12/12/11 0555 98/62 mmHg 98.1 F (36.7 C) - 70  19  99 %  12/11/11 2100 104/63 mmHg 98.3 F (36.8 C) Oral 63  20  98 %  12/11/11 2000 - - - - 17  98 %  12/11/11 1454 106/52 mmHg 98 F (36.7 C) - 62  20  99 %    @flow {1959:LAST@   Intake/Output from previous day:   07/30 0701 - 07/31 0700 In: 1320 [P.O.:1320] Out: 1900 [Urine:1900]   Intake/Output this shift:   07/31 0701 - 07/31 1900 In: 240 [P.O.:240] Out: 200 [Urine:200]   Intake/Output      07/30 0701 - 07/31 0700 07/31 0701 - 08/01 0700   P.O. 1320 240   I.V.     Total Intake 1320 240   Urine 1900 200   Drains     Total Output 1900 200   Net -580 +40           LABORATORY DATA:  Basename 12/12/11 0602 12/11/11 0632  WBC 5.5 5.7  HGB 9.5* 10.3*  HCT 27.3* 29.9*  PLT 103* 104*    Basename 12/12/11 0602 12/11/11 0632  NA 137 136  K 4.0 4.2  CL 101 101  CO2 27 29  BUN 8 12  CREATININE 0.70 0.79  GLUCOSE 106* 99  CALCIUM 9.0 8.8   Lab Results  Component Value Date   INR 0.92 12/03/2011   INR 0.95 01/12/2010   INR 0.95 01/08/2010    Examination:  General appearance: alert, cooperative and no distress Extremities: Homans sign is negative, no sign of DVT  Wound Exam: clean, dry, intact   Drainage:  None: wound tissue dry  Motor Exam: EHL and FHL Intact  Sensory Exam: Deep Peroneal normal  Vascular Exam:    Assessment:    2 Days Post-Op  Procedure(s)  (LRB): TOTAL KNEE ARTHROPLASTY (Right)  ADDITIONAL DIAGNOSIS:  Active Problems:  * No active hospital problems. *   Acute Blood Loss Anemia   Plan: Physical Therapy as ordered Weight Bearing as Tolerated (WBAT)  DVT Prophylaxis:  Lovenox  DISCHARGE PLAN: Home  DISCHARGE NEEDS: HHPT, CPM, Walker and 3-in-1 comode seat         Roy Tucker 12/12/2011, 1:14 PM

## 2011-12-12 NOTE — Progress Notes (Signed)
Physical Therapy Treatment Patient Details Name: Roy Tucker MRN: [id number] DOB: [birth year] Today's Date: 12/12/2011 Time: 1333-1400 PT Time Calculation (min): 27 min  PT Assessment / Plan / Recommendation Comments on Treatment Session  Pt continues to be able to show improvement. Pt able perform stair negotiation agaain however family came at end of session.  However reveiwed stair handout and proper technique as well as HEP.    Follow Up Recommendations  Home health PT    Barriers to Discharge        Equipment Recommendations  None recommended by PT    Recommendations for Other Services    Frequency 7X/week   Plan Frequency remains appropriate;Discharge plan remains appropriate    Precautions / Restrictions Precautions Precautions: Knee Restrictions Weight Bearing Restrictions: No   Pertinent Vitals/Pain 5/10 right LE pain    Mobility  Transfers Transfers: Sit to Stand;Stand to Sit Sit to Stand: 6: Modified independent (Device/Increase time) Stand to Sit: 6: Modified independent (Device/Increase time);To chair/3-in-1 Details for Transfer Assistance: mod I Ambulation/Gait Ambulation/Gait Assistance: 6: Modified independent (Device/Increase time) Ambulation Distance (Feet): 150 Feet Assistive device: Rolling walker Stairs: Yes Stairs Assistance: 4: Min assist Stairs Assistance Details (indicate cue type and reason): (A) to maintain balance and manage RW Stair Management Technique: Backwards;With walker Number of Stairs: 3     Exercises     PT Diagnosis:    PT Problem List:   PT Treatment Interventions:     PT Goals Acute Rehab PT Goals PT Goal Formulation: With patient Time For Goal Achievement: 12/18/11 Potential to Achieve Goals: Good Pt will go Sit to Stand: with modified independence PT Goal: Sit to Stand - Progress: Met Pt will go Stand to Sit: with modified independence PT Goal: Stand to Sit - Progress: Met Pt will Ambulate: >150 feet PT Goal:  Ambulate - Progress: Met Pt will Go Up / Down Stairs: 3-5 stairs;with min assist;with rolling walker PT Goal: Up/Down Stairs - Progress: Met Pt will Perform Home Exercise Program: Independently PT Goal: Perform Home Exercise Program - Progress: Progressing toward goal  Visit Information  Last PT Received On: 12/12/11 Assistance Needed: +1    Subjective Data  Subjective: "I feel real good about the stairs." Patient Stated Goal: to go home today   Cognition  Overall Cognitive Status: Appears within functional limits for tasks assessed/performed Arousal/Alertness: Awake/alert Orientation Level: Appears intact for tasks assessed Behavior During Session: [facility] for tasks performed    Balance     End of Session PT - End of Session Equipment Utilized During Treatment: Gait belt Activity Tolerance: Patient tolerated treatment well Patient left: with call bell/phone within reach Nurse Communication: Mobility status   GP     [name] 12/12/2011, 2:57 PM Jake Shark, PT DPT [phone]

## 2011-12-12 NOTE — Discharge Summary (Signed)
Georgena Spurling, MD   Altamese Cabal, PA-C [address] [place], [place], [place]  [address]                             [phone]  PATIENT ID: Roy Tucker        MRN:  [id number]          DOB/AGE: Jun 05, 1950 / 61 y.o.    DISCHARGE SUMMARY  ADMISSION DATE:    12/10/2011 DISCHARGE DATE:   12/12/2011   ADMISSION DIAGNOSIS: osteoarthritis right knee    DISCHARGE DIAGNOSIS:  osteoarthritis right knee    ADDITIONAL DIAGNOSIS: Active Problems:  * No active hospital problems. *   Past Medical History  Diagnosis Date  . Hypertension   . Arthritis   . Gout   . Chronic pain   . Bradycardia   . Dysrhythmia     bradycardia  . Pacemaker   . Hyperthyroidism     graves on prednisone  . Pneumonia     few months ago  . GERD (gastroesophageal reflux disease)     PROCEDURE: Procedure(s): TOTAL KNEE ARTHROPLASTY on 12/10/2011  CONSULTS:     HISTORY:  See H&P in chart  HOSPITAL COURSE:  PRATYUSH AMMON is a 61 y.o. admitted on 12/10/2011 and found to have a diagnosis of osteoarthritis right knee.  After appropriate laboratory studies were obtained  they were taken to the operating room on 12/10/2011 and underwent Procedure(s): TOTAL KNEE ARTHROPLASTY.   They were given perioperative antibiotics:  Anti-infectives     Start     Dose/Rate Route Frequency Ordered Stop   12/10/11 1300   ceFAZolin (ANCEF) IVPB 1 g/50 mL premix        1 g 100 mL/hr over 30 Minutes Intravenous Every 6 hours 12/10/11 1107 12/10/11 1906   12/09/11 1422   ceFAZolin (ANCEF) IVPB 2 g/50 mL premix        2 g 100 mL/hr over 30 Minutes Intravenous 60 min pre-op 12/09/11 1422 12/10/11 0710        .  Tolerated the procedure well.  Placed with a foley intraoperatively.  Given Ofirmev at induction and for 48 hours.    POD #1, allowed out of bed to a chair.  PT for ambulation and exercise program.  Foley D/C'd in morning.  IV saline locked.  O2 discontionued.  POD #2, continued PT and ambulation.    Hemovac pulled. .  The remainder of the hospital course was dedicated to ambulation and strengthening.   The patient was discharged on 2 Days Post-Op in  Good condition.  Blood products given:none  DIAGNOSTIC STUDIES: Recent vital signs: Patient Vitals for the past 24 hrs:  BP Temp Temp src Pulse Resp SpO2  12/12/11 0605 108/68 mmHg - - - - -  12/12/11 0555 98/62 mmHg 98.1 F (36.7 C) - 70  19  99 %  12-15-11 2100 104/63 mmHg 98.3 F (36.8 C) Oral 63  20  98 %  Dec 15, 2011 2000 - - - - 17  98 %  Dec 15, 2011 1454 106/52 mmHg 98 F (36.7 C) - 62  20  99 %       Recent laboratory studies:  [facility] 12/12/11 0602 12-15-11 0632  WBC 5.5 5.7  HGB 9.5* 10.3*  HCT 27.3* 29.9*  PLT 103* 104*    Basename 12/12/11 0602 15-Dec-2011 0632  NA 137 136  K 4.0 4.2  CL 101 101  CO2  27 29  BUN 8 12  CREATININE 0.70 0.79  GLUCOSE 106* 99  CALCIUM 9.0 8.8   Lab Results  Component Value Date   INR 0.92 12/03/2011   INR 0.95 01/12/2010   INR 0.95 01/08/2010     Recent Radiographic Studies :  Dg Chest 2 View  12/03/2011  *RADIOLOGY REPORT*  Clinical Data: Preop right knee surgery  CHEST - 2 VIEW  Comparison: 10/01/2011  Findings: Resolution of right lower lobe infiltrate.  Lungs are now clear without pneumonia or effusion.  Negative for heart failure. Dual lead pacemaker unchanged.  IMPRESSION: No active cardiopulmonary disease.  Interval clearing of right lower lobe infiltrate.  Original Report Authenticated By: Camelia Phenes, M.D.    DISCHARGE INSTRUCTIONS: Discharge Orders    Future Appointments: Provider: Department: Dept Phone: Center:   12/20/2011 9:15 AM Lucy Antigua [facility] [phone] [facility]     Future Orders Please Complete By Expires   Diet - low sodium heart healthy      Call MD / Call 911      Comments:   If you experience chest pain or shortness of breath, CALL 911 and be transported to the hospital emergency room.  If you develope a fever above 101 F, pus (white drainage) or  increased drainage or redness at the wound, or calf pain, call your surgeon's office.   Constipation Prevention      Comments:   Drink plenty of fluids.  Prune juice may be helpful.  You may use a stool softener, such as Colace (over the counter) 100 mg twice a day.  Use MiraLax (over the counter) for constipation as needed.   Increase activity slowly as tolerated      Driving restrictions      Comments:   No driving for 6 weeks   Lifting restrictions      Comments:   No lifting for 6 weeks   CPM      Comments:   Continuous passive motion machine (CPM):      Use the CPM from 0 to 90 for 6-8 hours per day.      You may increase by 10 per day.  You may break it up into 2 or 3 sessions per day.      Use CPM for 2 weeks or until you are told to stop.   TED hose      Comments:   Use stockings (TED hose) for 3 weeks on both leg(s).  You may remove them at night for sleeping.   Do not put a pillow under the knee. Place it under the heel.      Change dressing      Comments:   Change dressing on thursday, then change the dressing daily with sterile 4 x 4 inch gauze dressing and apply TED hose.  You may clean the incision with alcohol prior to redressing.      DISCHARGE MEDICATIONS:   Medication List  As of 12/12/2011  1:21 PM   STOP taking these medications         HYDROcodone-acetaminophen 10-325 MG per tablet         TAKE these medications         albuterol 108 (90 BASE) MCG/ACT inhaler   Commonly known as: PROVENTIL HFA;VENTOLIN HFA   Inhale 2 puffs into the lungs every 6 (six) hours as needed for wheezing or shortness of breath.      atenolol 25 MG tablet   Commonly known as:  TENORMIN   Take 25 mg by mouth daily.      enoxaparin 40 MG/0.4ML injection   Commonly known as: LOVENOX   Inject 0.4 mLs (40 mg total) into the skin daily.      esomeprazole 40 MG capsule   Commonly known as: NEXIUM   Take 40 mg by mouth daily before breakfast.      methocarbamol 500 MG tablet    Commonly known as: ROBAXIN   Take 1-2 tablets (500-1,000 mg total) by mouth every 6 (six) hours as needed.      naproxen sodium 220 MG tablet   Commonly known as: ANAPROX   Take 220 mg by mouth every 12 (twelve) hours as needed. For pain.      oxyCODONE 5 MG immediate release tablet   Commonly known as: Oxy IR/ROXICODONE   Take 1-2 tablets (5-10 mg total) by mouth every 3 (three) hours as needed.      oxyCODONE 10 MG 12 hr tablet   Commonly known as: OXYCONTIN   Take 1 tablet (10 mg total) by mouth every 12 (twelve) hours.      predniSONE 10 MG tablet   Commonly known as: DELTASONE   Take 10 mg by mouth daily.            FOLLOW UP VISIT:   Follow-up Information    Follow up with Raymon Mutton, MD. Call on 12/25/2011.   Contact information:   [address] [place] [address] [phone]          DISPOSITION:  Home    CONDITION:  Good   Jashun Puertas 12/12/2011, 1:21 PM

## 2011-12-12 NOTE — Progress Notes (Signed)
Physical Therapy Treatment Patient Details Name: Roy Tucker MRN: [id number] DOB: 02/18/1951 Today's Date: 12/12/2011 Time: 4098-1191 PT Time Calculation (min): 27 min  PT Assessment / Plan / Recommendation Comments on Treatment Session  pt able to increase ambulation and complete stair negotiation    Follow Up Recommendations  Home health PT    Barriers to Discharge        Equipment Recommendations  None recommended by PT    Recommendations for Other Services    Frequency 7X/week   Plan Frequency remains appropriate;Discharge plan remains appropriate    Precautions / Restrictions Precautions Precautions: Knee Restrictions Weight Bearing Restrictions: No   Pertinent Vitals/Pain 4/10 right knee     Mobility  Bed Mobility Bed Mobility: Supine to Sit;Sit to Supine Supine to Sit: 6: Modified independent (Device/Increase time) Transfers Transfers: Sit to Stand;Stand to Sit Sit to Stand: 5: Supervision;From bed Stand to Sit: 5: Supervision;To chair/3-in-1 Details for Transfer Assistance: Supervision for safety Ambulation/Gait Ambulation/Gait Assistance: 5: Supervision Ambulation Distance (Feet): 150 Feet Assistive device: Rolling walker Ambulation/Gait Assistance Details: Superivision for safety with cues to look straight ahead Gait Pattern: Step-to pattern;Decreased stride length;Shuffle;Antalgic Stairs: Yes Stairs Assistance: 4: Min assist Stairs Assistance Details (indicate cue type and reason): (A) to maintain balance and manage RW  Stair Management Technique: Backwards;With walker Number of Stairs: 3     Exercises     PT Diagnosis:    PT Problem List:   PT Treatment Interventions:     PT Goals Acute Rehab PT Goals PT Goal Formulation: With patient Time For Goal Achievement: 12/18/11 Potential to Achieve Goals: Good Pt will go Supine/Side to Sit: with modified independence PT Goal: Supine/Side to Sit - Progress: Progressing toward goal Pt will go  Sit to Supine/Side: with modified independence PT Goal: Sit to Supine/Side - Progress: Progressing toward goal Pt will go Sit to Stand: with modified independence PT Goal: Sit to Stand - Progress: Progressing toward goal Pt will go Stand to Sit: with modified independence PT Goal: Stand to Sit - Progress: Progressing toward goal Pt will Ambulate: >150 feet;with modified independence;with rolling walker PT Goal: Ambulate - Progress: Progressing toward goal Pt will Go Up / Down Stairs: 3-5 stairs;with min assist;with rolling walker PT Goal: Up/Down Stairs - Progress: Met Pt will Perform Home Exercise Program: Independently PT Goal: Perform Home Exercise Program - Progress: Progressing toward goal  Visit Information  Last PT Received On: 12/12/11 Assistance Needed: +1    Subjective Data  Subjective: "Did I pass so I can go home?" Patient Stated Goal: to go home today   Cognition  Overall Cognitive Status: Appears within functional limits for tasks assessed/performed Arousal/Alertness: Awake/alert Orientation Level: Appears intact for tasks assessed Behavior During Session: [facility] for tasks performed    Balance     End of Session PT - End of Session Equipment Utilized During Treatment: Gait belt Activity Tolerance: Patient tolerated treatment well Patient left: with call bell/phone within reach (In bathroom educated to pull string for assistance, RN aware) Nurse Communication: Mobility status CPM Right Knee CPM Right Knee: Off   GP     Carroll Ranney 12/12/2011, 9:47 AM Jake Shark, PT DPT [phone]

## 2011-12-12 NOTE — Progress Notes (Signed)
Occupational Therapy Evaluation Patient Details Name: Roy Tucker MRN: [id number] DOB: 05-13-1951 Today's Date: 12/12/2011 Time: 1250-1310 OT Time Calculation (min): 20 min  OT Assessment / Plan / Recommendation Clinical Impression  61 yo s/p R TKA. All education regarding ADL, AE, DME and homw safety completed. No further Ot needs.    OT Assessment  Patient does not need any further OT services    Follow Up Recommendations  No OT follow up    Barriers to Discharge  none    Equipment Recommendations    none   Recommendations for Other Services    Frequency    eval only   Precautions / Restrictions Precautions Precautions: Knee Restrictions Weight Bearing Restrictions: No   Pertinent Vitals/Pain No c/o pain    ADL  Eating/Feeding: Performed;Independent Where Assessed - Eating/Feeding: Chair Grooming: Performed;Modified independent Where Assessed - Grooming: Unsupported standing Upper Body Bathing: Simulated;Modified independent Where Assessed - Upper Body Bathing: Unsupported sitting Lower Body Bathing: Simulated;Minimal assistance Where Assessed - Lower Body Bathing: Unsupported sit to stand Upper Body Dressing: Modified independent Where Assessed - Upper Body Dressing: Unsupported sitting Lower Body Dressing: Minimal assistance Where Assessed - Lower Body Dressing: Unsupported sit to stand Toilet Transfer: Simulated;Modified independent Toilet Transfer Method: Sit to Barista: Comfort height toilet Toileting - Clothing Manipulation and Hygiene: Simulated;Modified independent Where Assessed - Toileting Clothing Manipulation and Hygiene: Standing Tub/Shower Transfer: Performed;Supervision/safety Equipment Used: Reacher;Long-handled sponge;Rolling walker Transfers/Ambulation Related to ADLs: mod I ADL Comments: Educated on availability of AE for ADL and stepping backwards into shower.    OT Diagnosis:    OT Problem List:   OT  Treatment Interventions:     OT Goals Acute Rehab OT Goals OT Goal Formulation:  (eval only)  Visit Information  Last OT Received On: 12/12/11 Assistance Needed: +1    Subjective Data      Prior Functioning  Vision/Perception  Home Living Lives With: Spouse Available Help at Discharge: Family Type of Home: House Home Access: Stairs to enter Secretary/administrator of Steps: 3 Entrance Stairs-Rails: None Home Layout: One level Bathroom Shower/Tub: Walk-in shower;Door [employer] Toilet: Standard Home Adaptive Equipment: Walker - rolling;Bedside commode/3-in-1 Prior Function Level of Independence: Independent Able to Take Stairs?: Yes Driving: Yes Communication Communication: No difficulties Dominant Hand: Right      Cognition  Overall Cognitive Status: Appears within functional limits for tasks assessed/performed Arousal/Alertness: Awake/alert Orientation Level: Appears intact for tasks assessed Behavior During Session: [facility] for tasks performed    Extremity/Trunk Assessment Right Upper Extremity Assessment RUE ROM/Strength/Tone: Within functional levels Left Upper Extremity Assessment LUE ROM/Strength/Tone: Within functional levels Trunk Assessment Trunk Assessment: Normal   Mobility Transfers Transfers: Sit to Stand;Stand to Sit Sit to Stand: 6: Modified independent (Device/Increase time) Stand to Sit: 6: Modified independent (Device/Increase time);To chair/3-in-1 Details for Transfer Assistance: mod I   Exercise    Balance  WFL  End of Session OT - End of Session Activity Tolerance: Patient tolerated treatment well Patient left: in chair;with call bell/phone within reach  GO     [facility] 12/12/2011, 1:47 PM [facility], OTR/L  [phone] 12/12/2011

## 2011-12-20 ENCOUNTER — Ambulatory Visit: Payer: Medicaid Other | Attending: Orthopedic Surgery

## 2011-12-20 DIAGNOSIS — M6281 Muscle weakness (generalized): Secondary | ICD-10-CM | POA: Insufficient documentation

## 2011-12-20 DIAGNOSIS — IMO0001 Reserved for inherently not codable concepts without codable children: Secondary | ICD-10-CM | POA: Insufficient documentation

## 2011-12-20 DIAGNOSIS — M25569 Pain in unspecified knee: Secondary | ICD-10-CM | POA: Insufficient documentation

## 2011-12-20 DIAGNOSIS — M25669 Stiffness of unspecified knee, not elsewhere classified: Secondary | ICD-10-CM | POA: Insufficient documentation

## 2011-12-20 DIAGNOSIS — R262 Difficulty in walking, not elsewhere classified: Secondary | ICD-10-CM | POA: Insufficient documentation

## 2011-12-31 ENCOUNTER — Ambulatory Visit: Payer: Medicaid Other

## 2012-01-07 ENCOUNTER — Ambulatory Visit: Payer: Medicaid Other

## 2012-01-15 ENCOUNTER — Ambulatory Visit: Payer: Medicaid Other | Attending: Cardiovascular Disease

## 2012-01-15 DIAGNOSIS — M25669 Stiffness of unspecified knee, not elsewhere classified: Secondary | ICD-10-CM | POA: Insufficient documentation

## 2012-01-15 DIAGNOSIS — M6281 Muscle weakness (generalized): Secondary | ICD-10-CM | POA: Insufficient documentation

## 2012-01-15 DIAGNOSIS — IMO0001 Reserved for inherently not codable concepts without codable children: Secondary | ICD-10-CM | POA: Insufficient documentation

## 2012-01-15 DIAGNOSIS — R262 Difficulty in walking, not elsewhere classified: Secondary | ICD-10-CM | POA: Insufficient documentation

## 2012-01-15 DIAGNOSIS — M25569 Pain in unspecified knee: Secondary | ICD-10-CM | POA: Insufficient documentation

## 2012-01-24 ENCOUNTER — Ambulatory Visit: Payer: Medicaid Other

## 2012-01-29 ENCOUNTER — Ambulatory Visit: Payer: Medicaid Other

## 2012-02-05 ENCOUNTER — Ambulatory Visit: Payer: Medicaid Other

## 2012-03-26 ENCOUNTER — Other Ambulatory Visit: Payer: Self-pay | Admitting: Neurosurgery

## 2012-03-26 DIAGNOSIS — M5410 Radiculopathy, site unspecified: Secondary | ICD-10-CM

## 2012-03-26 DIAGNOSIS — M549 Dorsalgia, unspecified: Secondary | ICD-10-CM

## 2012-04-02 ENCOUNTER — Ambulatory Visit
Admission: RE | Admit: 2012-04-02 | Discharge: 2012-04-02 | Disposition: A | Discharge status: Home / Self Care | Payer: Medicaid Other | Source: Ambulatory Visit | Attending: Neurosurgery | Admitting: Neurosurgery

## 2012-04-02 VITALS — BP 100/54 | HR 59

## 2012-04-02 DIAGNOSIS — M549 Dorsalgia, unspecified: Secondary | ICD-10-CM

## 2012-04-02 DIAGNOSIS — M5410 Radiculopathy, site unspecified: Secondary | ICD-10-CM

## 2012-04-02 MED ORDER — IOHEXOL 180 MG/ML  SOLN
15.0000 mL | Freq: Once | INTRAMUSCULAR | Status: AC | PRN
  Administered 2012-04-02: 15 mL via INTRATHECAL

## 2012-04-02 MED ORDER — MEPERIDINE HCL 100 MG/ML IJ SOLN
75.0000 mg | Freq: Once | INTRAMUSCULAR | Status: AC
  Administered 2012-04-02: 75 mg via INTRAMUSCULAR

## 2012-04-02 MED ORDER — ONDANSETRON HCL 4 MG/2ML IJ SOLN
4.0000 mg | Freq: Once | INTRAMUSCULAR | Status: AC
  Administered 2012-04-02: 4 mg via INTRAMUSCULAR

## 2012-04-02 MED ORDER — DIAZEPAM 5 MG PO TABS
5.0000 mg | ORAL_TABLET | Freq: Once | ORAL | Status: AC
  Administered 2012-04-02: 5 mg via ORAL

## 2012-11-05 ENCOUNTER — Ambulatory Visit (INDEPENDENT_AMBULATORY_CARE_PROVIDER_SITE_OTHER): Payer: Medicaid Other | Admitting: *Deleted

## 2012-11-05 DIAGNOSIS — I495 Sick sinus syndrome: Secondary | ICD-10-CM

## 2012-11-05 LAB — PACEMAKER DEVICE OBSERVATION
AL AMPLITUDE: 1.3 mv
AL IMPEDENCE PM: 430 Ohm
AL THRESHOLD: 0.625 V
ATRIAL PACING PM: 97
BAMS-0001: 150 {beats}/min
BAMS-0003: 70 {beats}/min
BATTERY VOLTAGE: 2.95 V
DEVICE MODEL PM: 7168017
RV LEAD AMPLITUDE: 11.3 mv
RV LEAD IMPEDENCE PM: 380 Ohm
RV LEAD THRESHOLD: 1.75 V
VENTRICULAR PACING PM: 1.8

## 2012-11-05 NOTE — Progress Notes (Signed)
In office pacemaker interrogation. Normal device function. Minimal changes made to the device.

## 2012-11-05 NOTE — Patient Instructions (Addendum)
Follow up with Dr.Croitoru in September for your next pacemaker check.

## 2012-11-19 ENCOUNTER — Encounter: Payer: Self-pay | Admitting: Cardiovascular Disease

## 2012-11-24 ENCOUNTER — Telehealth: Payer: Self-pay | Admitting: *Deleted

## 2012-11-24 NOTE — Telephone Encounter (Signed)
Stated pt with swelling above collar bone.  Had CT of neck w/ "thickening of soft tissue... No lymphadenopathy."  Sallye Ober, PA wanted to know when pt's last visit was and if it would be worth the pt's and cardiologist's time to see pt again.  Stated they had the radiologist go back and there was too much artifact from the pacemaker wires.  Informed last pacer check was 6.25.14 and last OV was Aug. 2013.  Informed S. Caron Presume will be notified to review and possibly discuss w/ Dr. Royann Shivers for further instructions.  Stated pt has gone for the day.  Verbalized understanding and agreed w/ plan  Louise's nurse, Misty Stanley can be reached at [phone].  Paper chart# [id number].

## 2012-11-26 NOTE — Telephone Encounter (Signed)
**  Late Entry** 7.14.14 - Discussed w/ S. Lelon Perla, CMA with devices and advised pt come in for evaluation.    7.16.14 - Call to pt and spoke w/ wife, Harriett Sine.  Appt scheduled for tomorrow, 7.17.14 at 11:30am w/ Dr. Royann Shivers.

## 2012-11-27 ENCOUNTER — Encounter: Payer: Self-pay | Admitting: Cardiovascular Disease

## 2012-11-27 ENCOUNTER — Ambulatory Visit (INDEPENDENT_AMBULATORY_CARE_PROVIDER_SITE_OTHER): Payer: Medicaid Other | Admitting: Cardiovascular Disease

## 2012-11-27 VITALS — BP 128/60 | HR 60 | Resp 20 | Ht 70.0 in | Wt 206.4 lb

## 2012-11-27 DIAGNOSIS — I871 Compression of vein: Secondary | ICD-10-CM

## 2012-11-27 DIAGNOSIS — Z950 Presence of cardiac pacemaker: Secondary | ICD-10-CM

## 2012-11-27 DIAGNOSIS — I4891 Unspecified atrial fibrillation: Secondary | ICD-10-CM

## 2012-11-27 NOTE — Patient Instructions (Addendum)
Your physician recommends that you schedule a follow-up appointment in: 3 months.  

## 2012-11-28 DIAGNOSIS — I871 Compression of vein: Secondary | ICD-10-CM | POA: Insufficient documentation

## 2012-11-28 DIAGNOSIS — Z950 Presence of cardiac pacemaker: Secondary | ICD-10-CM | POA: Insufficient documentation

## 2012-11-28 NOTE — Assessment & Plan Note (Signed)
Normal pacemaker function the device was fully evaluated with a remote [facility] test just last month.

## 2012-11-28 NOTE — Progress Notes (Signed)
Patient ID: Roy Tucker, male   DOB: 01/24/1951, 62 y.o.   MRN: [id number]     Reason for office visit Soft tissue swelling in Roy area of Roy neck  Roy Tucker comes today at Roy urging of Dr.Wolicki, who recently performed upper laryngoscopy due to concerns of airway obstruction. Roy patient is a very poor historian and he told me that his ENT physician was concerned that his neck swelling may be due to infection of Roy pacemaker leads. I think he misunderstood Dr.Wolicki. 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 I have a very limited data. Roy patient brought a disc of a CT of Roy neck and was unable to open Roy study. I do have a report of Roy CT of Roy soft tissue of Roy neck but it describes" circumferential thickening of Roy soft tissues of Waldeyer's ring resulting in significant airway narrowing but otherwise negative CT soft tissue neck without pathologically enlarged lymphadenopathy". Direct laryngoscopy shows no evidence of airway narrowing. Roy Tucker thinks he may have had a CT of Roy chest as well but I see no evidence of this in Roy records I have available.  His pacemaker was implanted in 2011 for sinus node dysfunction and so far has not caused any difficulties. It was recently checked via remote monitoring and showed normal function. He does use Roy pacemaker quite a bit with almost 100% atrial pacing but does not require ventricular pacing and does not appear to be pacemaker dependent.  Has a history of thyroid ablation and takes levothyroxin. There is a remote history of drug abuse.  He is currently on treatment with prednisone and has been on this for "quite a while". I think this was prescribed for rheumatoid arthritis. It sounds like he has received some recent infusional biological agent for rheumatoid arthritis. Again I have no records of  this.  Allergies  Allergen Reactions  . Aspirin Other (See Comments)    intolerance  . Methimazole (Thiamazole)     unsure  . Tylenol (Acetaminophen)     Current Outpatient Prescriptions  Medication Sig Dispense Refill  . albuterol (PROVENTIL HFA;VENTOLIN HFA) 108 (90 BASE) MCG/ACT inhaler Inhale 2 puffs into Roy lungs every 6 (six) hours as needed for wheezing or shortness of breath.  1 Inhaler  0  . atenolol (TENORMIN) 25 MG tablet Take 25 mg by mouth daily.      Marland Kitchen esomeprazole (NEXIUM) 40 MG capsule Take 40 mg by mouth daily before breakfast.      . levothyroxine (SYNTHROID, LEVOTHROID) 175 MCG tablet Take 175 mcg by mouth daily before breakfast.      . predniSONE (DELTASONE) 10 MG tablet Take 10 mg by mouth daily.       No current facility-administered medications for this visit.    Past Medical History  Diagnosis Date  . Hypertension   . Arthritis   . Gout   . Chronic pain   . Bradycardia   . Dysrhythmia     bradycardia  . Pacemaker   . Hyperthyroidism     graves on prednisone  . Pneumonia     few months ago  . GERD (gastroesophageal reflux disease)     Past Surgical History  Procedure Laterality Date  . Pacemaker insertion  11  . Colonoscopy w/ polypectomy    . Total knee arthroplasty  12/10/2011  Procedure: TOTAL KNEE ARTHROPLASTY;  Surgeon: Raymon Mutton, MD;  Location: [facility] OR;  Service: Orthopedics;  Laterality: Right;    No family history on file.  History   Social History  . Marital Status: Married    Spouse Name: N/A    Number of Children: N/A  . Years of Education: N/A   Occupational History  . Not on file.   Social History Main Topics  . Smoking status: Former Smoker -- 1.00 packs/day for 15 years    Types: Cigarettes    Quit date: 12/03/2003  . Smokeless tobacco: Not on file  . Alcohol Use: No  . Drug Use: No  . Sexually Active: Not Currently   Other Topics Concern  . Not on file   Social History Narrative  . No narrative on  file    Review of systems: Roy patient specifically denies any chest pain at rest or with exertion, dyspnea at rest or with exertion, orthopnea, paroxysmal nocturnal dyspnea, syncope, palpitations, focal neurological deficits, intermittent claudication, lower extremity edema, unexplained weight gain, cough, hemoptysis or wheezing.  Roy patient also denies abdominal pain, nausea, vomiting, dysphagia, diarrhea, constipation, polyuria, polydipsia, dysuria, hematuria, frequency, urgency, abnormal bleeding or bruising, fever, chills, unexpected weight changes, mood swings, change in skin or hair texture, change in voice quality, auditory or visual problems, allergic reactions or rashes, new musculoskeletal complaints other than usual "aches and pains".   PHYSICAL EXAM BP 128/60  Pulse 60  Resp 20  Ht 5\' 10"  (1.778 m)  Wt 206 lb 6.4 oz (93.622 kg)  BMI 29.62 kg/m2  General: Alert, oriented x3, no distress Head: no evidence of trauma, PERRL, EOMI, no exophtalmos or lid lag, no myxedema, no xanthelasma; normal ears, nose and oropharynx, has a cushingoid face Neck: He seems to have hypertrophic soft tissues in Roy area of Roy parotid glands submandibular glands and bilateral lateral cervical areas; there is prominent supraclavicular fat pad; he appears to have a "buffalo hump. It is very hard to see jugular venous pulsations and there is no hepatojugular reflux; brisk carotid pulses without delay and no carotid bruits Chest: clear to auscultation, no signs of consolidation by percussion or palpation, normal fremitus, symmetrical and full respiratory excursions; I do not see any evidence of collateral vein circulation; Roy superior pacemaker site appears healthy Cardiovascular: normal position and quality of Roy apical impulse, regular rhythm, normal first and second heart sounds, no murmurs, rubs or gallops Abdomen: no tenderness or distention, no masses by palpation, no abnormal pulsatility or arterial  bruits, normal bowel sounds, no hepatosplenomegaly Extremities: no clubbing, cyanosis or edema; 2+ radial, ulnar and brachial pulses bilaterally; 2+ right femoral, posterior tibial and dorsalis pedis pulses; 2+ left femoral, posterior tibial and dorsalis pedis pulses; no subclavian or femoral bruits Neurological: grossly nonfocal  Lipid Panel     Component Value Date/Time   CHOL  Value: 225        ATP III CLASSIFICATION:  <200     mg/dL   Desirable  161-096  mg/dL   Borderline High  >=045    mg/dL   High       * 08/20/8117 0330   TRIG 92 01/09/2010 0330   HDL 42 01/09/2010 0330   CHOLHDL 5.4 01/09/2010 0330   VLDL 18 01/09/2010 0330   LDLCALC  Value: 165        Total Cholesterol/HDL:CHD Risk Coronary Heart Disease Risk Table  Men   Women  1/2 Average Risk   3.4   3.3  Average Risk       5.0   4.4  2 X Average Risk   9.6   7.1  3 X Average Risk  23.4   11.0        Use Roy calculated Patient Ratio above and Roy CHD Risk Table to determine Roy patient's CHD Risk.        ATP III CLASSIFICATION (LDL):  <100     mg/dL   Optimal  161-096  mg/dL   Near or Above                    Optimal  130-159  mg/dL   Borderline  045-409  mg/dL   High  >811     mg/dL   Very High* 01/26/7828 0330    BMET    Component Value Date/Time   NA 137 12/12/2011 0602   K 4.0 12/12/2011 0602   CL 101 12/12/2011 0602   CO2 27 12/12/2011 0602   GLUCOSE 106* 12/12/2011 0602   BUN 8 12/12/2011 0602   CREATININE 0.70 12/12/2011 0602   CALCIUM 9.0 12/12/2011 0602   GFRNONAA >90 12/12/2011 0602   GFRAA >90 12/12/2011 0602     ASSESSMENT AND PLAN SVC syndrome There are certainly some features that suggest this diagnosis but I see no convincing evidence of collateral venous circulation. It is very hard to evaluate his jugular veins, they're very hard to distinguish from Roy surrounding soft tissue. 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 He does not appear particularly plethoric. He has a "buffalo hump" and I wonder to what  degree his soft tissue changes are related to iatrogenic Cushing syndrome rather than to SVC obstruction. I do not have Roy actual reports of Roy CT of his chest and neck, and have been unable to open Roy radiological study on Roy disc that he brought today. I'm not commence that he has SVC syndrome. An angiogram may be necessary to clarify this. First I would like to actually review Roy radiology report.  Pacemaker Normal pacemaker function Roy device was fully evaluated with a remote [employer] test just last month.  No orders of Roy defined types were placed in this encounter.   Meds ordered this encounter  Medications  . levothyroxine (SYNTHROID, LEVOTHROID) 175 MCG tablet    Sig: Take 175 mcg by mouth daily before breakfast.    Taquita Demby  Thurmon Fair, MD, [facility] [phone] office [phone] pager

## 2012-11-28 NOTE — Assessment & Plan Note (Addendum)
There are certainly some features that suggest this diagnosis but I see no convincing evidence of collateral venous circulation. It is very hard to evaluate his jugular veins, they're very hard to distinguish from the surrounding soft tissue. 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 He does not appear particularly plethoric. He has a "buffalo hump" and I wonder to what degree his soft tissue changes are related to iatrogenic Cushing syndrome rather than to SVC obstruction. I do not have the actual reports of the CT of his chest and neck, and have been unable to open the radiological study on the disc that he brought today. I'm not convinced that he has SVC syndrome. An angiogram may be necessary to clarify this. First I would like to actually review the radiology report of his CT of the chest if this was indeed performed. If not that may be the best first step .

## 2012-12-08 ENCOUNTER — Telehealth: Payer: Self-pay | Admitting: Cardiovascular Disease

## 2012-12-08 NOTE — Telephone Encounter (Signed)
Needs authorization for his Nexium per [facility]

## 2013-02-18 ENCOUNTER — Other Ambulatory Visit: Payer: Self-pay | Admitting: *Deleted

## 2013-02-18 MED ORDER — ESOMEPRAZOLE MAGNESIUM 40 MG PO CPDR: 40.0000 mg | DELAYED_RELEASE_CAPSULE | Freq: Every day | ORAL | Status: DC

## 2013-02-18 NOTE — Telephone Encounter (Signed)
Rx was sent to pharmacy electronically. 

## 2013-04-01 ENCOUNTER — Encounter: Payer: Self-pay | Admitting: *Deleted

## 2013-04-02 ENCOUNTER — Ambulatory Visit (INDEPENDENT_AMBULATORY_CARE_PROVIDER_SITE_OTHER): Payer: Medicaid Other | Admitting: Cardiovascular Disease

## 2013-04-02 ENCOUNTER — Encounter: Payer: Self-pay | Admitting: Cardiovascular Disease

## 2013-04-02 ENCOUNTER — Ambulatory Visit (INDEPENDENT_AMBULATORY_CARE_PROVIDER_SITE_OTHER): Payer: Medicaid Other | Admitting: *Deleted

## 2013-04-02 VITALS — BP 120/90 | HR 64 | Resp 16 | Ht 70.0 in | Wt 207.3 lb

## 2013-04-02 DIAGNOSIS — Z950 Presence of cardiac pacemaker: Secondary | ICD-10-CM

## 2013-04-02 DIAGNOSIS — I495 Sick sinus syndrome: Secondary | ICD-10-CM

## 2013-04-02 LAB — MDC_IDC_ENUM_SESS_TYPE_INCLINIC
Battery Voltage: 2.95 V
Brady Statistic RA Percent Paced: 98 %
Brady Statistic RV Percent Paced: 6.3 %
Implantable Pulse Generator Model: 2210
Implantable Pulse Generator Serial Number: 7168017
Lead Channel Impedance Value: 2.5 Ohm
Lead Channel Impedance Value: 390 Ohm
Lead Channel Impedance Value: 390 Ohm
Lead Channel Impedance Value: 460 Ohm
Lead Channel Pacing Threshold Amplitude: 0.625 V
Lead Channel Pacing Threshold Amplitude: 1.5 V
Lead Channel Pacing Threshold Pulse Width: 0.4 ms
Lead Channel Pacing Threshold Pulse Width: 0.8 ms
Lead Channel Sensing Intrinsic Amplitude: 10.6 mV
Lead Channel Sensing Intrinsic Amplitude: 2.5 mV
Lead Channel Setting Pacing Amplitude: 1.625
Lead Channel Setting Pacing Amplitude: 2 V
Lead Channel Setting Pacing Pulse Width: 0.8 ms
Lead Channel Setting Sensing Sensitivity: 2 mV

## 2013-04-02 LAB — PACEMAKER DEVICE OBSERVATION

## 2013-04-02 NOTE — Assessment & Plan Note (Addendum)
Pacemaker check in clinic. Normal device function. Thresholds, sensing, impedances consistent with previous measurements.  96% atrial pacing, very infrequent ventricular pacing. Device programmed to maximize longevity. 5 mode switches(<1%)----max dur. 8 sec, Max A 375, Max V 94----last 01-05-2013 atrial tachycardia. No high ventricular rates noted. 1 "PMT" episode- actually brief atrial tachycardia. Device programmed at appropriate safety margins. Histogram distribution appropriate for patient activity level. Device programmed to optimize intrinsic conduction.  Estimated longevity 6->7 years. Patient will follow up with the device clinic in 09-2013 and with MD in 12 months.

## 2013-04-02 NOTE — Patient Instructions (Signed)
Your physician recommends that you schedule a follow-up appointment on Sep 17, 2013 @ 11:00am with the device clinic on [address], and in 12 months with Dr.Croitoru

## 2013-04-02 NOTE — Progress Notes (Signed)
Patient ID: Roy Tucker, male   DOB: 01/07/1951, 62 y.o.   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 He has been diagnosed with rheumatoid arthritis. Now receiving Remicade.  He has occasional lightheadedness when he first gets up in the morning, but no complaints of syncope, fatigue or other complaints associated with bradycardia.   Allergies  Allergen Reactions  . Aspirin Other (See Comments)    intolerance  . Methimazole [Thiamazole]     unsure  . Neurontin [Gabapentin]   . Tylenol [Acetaminophen]     Current Outpatient Prescriptions  Medication Sig Dispense Refill  . albuterol (PROVENTIL HFA;VENTOLIN HFA) 108 (90 BASE) MCG/ACT inhaler Inhale 2 puffs into the lungs every 6 (six) hours as needed for wheezing or shortness of breath.  1 Inhaler  0  . atenolol (TENORMIN) 25 MG tablet Take 25 mg by mouth daily.      Marland Kitchen esomeprazole (NEXIUM) 40 MG capsule Take 1 capsule (40 mg total) by mouth daily before breakfast.  30 capsule  10  . HYDROcodone-acetaminophen (NORCO) 10-325 MG per tablet Take 1 tablet by mouth every morning.      . inFLIXimab (REMICADE) 100 MG injection Inject 400 mg into the vein every 8 (eight) weeks.      Marland Kitchen levothyroxine (SYNTHROID, LEVOTHROID) 175 MCG tablet Take 175 mcg by mouth daily before breakfast.       No current facility-administered medications for this visit.    Past Medical History  Diagnosis Date  . Hypertension   . Arthritis   . Gout   . Chronic pain   . Bradycardia   .  Dysrhythmia     bradycardia  . Pacemaker   . Hyperthyroidism     graves on prednisone  . Pneumonia     few months ago  . GERD (gastroesophageal reflux disease)   . Sinus node dysfunction 01/12/2010    [employer] pacemaker  . Atrial tachycardia     Past Surgical History  Procedure Laterality Date  . Pacemaker insertion  01/12/2010    [employer] Accent DR RF  . Colonoscopy w/ polypectomy    . Total knee arthroplasty  12/10/2011    Procedure: TOTAL KNEE ARTHROPLASTY;  Surgeon: Raymon Mutton, MD;  Location: [facility] OR;  Service: Orthopedics;  Laterality: Right;  . Nm myocar perf wall motion  07/05/2010    no significant ischemia    No family history on file.  History   Social History  . Marital Status: Married    Spouse Name: N/A    Number of Children: N/A  . Years of Education: N/A   Occupational History  . Not on file.   Social History Main Topics  . Smoking status: Former Smoker -- 1.00 packs/day for 15 years    Types: Cigarettes    Quit date: 12/03/2003  . Smokeless tobacco: Not on file  . Alcohol Use: No  . Drug Use: No  . Sexual Activity: Not Currently   Other Topics Concern  . Not on file   Social History Narrative  .  No narrative on file    Review of systems: The patient specifically denies any chest pain at rest or with exertion, dyspnea at rest or with exertion, orthopnea, paroxysmal nocturnal dyspnea, syncope, palpitations, focal neurological deficits, intermittent claudication, lower extremity edema, unexplained weight gain, cough, hemoptysis or wheezing.  The patient also denies abdominal pain, nausea, vomiting, dysphagia, diarrhea, constipation, polyuria, polydipsia, dysuria, hematuria, frequency, urgency, abnormal bleeding or bruising, fever, chills, unexpected weight changes, mood swings, change in skin or hair texture, change in voice quality, auditory or visual problems, allergic reactions or rashes, new musculoskeletal complaints other than usual "aches and  pains".    PHYSICAL EXAM BP 120/90  Pulse 64  Resp 16  Ht 5\' 10"  (1.778 m)  Wt 207 lb 4.8 oz (94.031 kg)  BMI 29.74 kg/m2 General: Alert, oriented x3, no distress  Head: no evidence of trauma, PERRL, EOMI, no exophtalmos or lid lag, no myxedema, no xanthelasma; normal ears, nose and oropharynx, has a cushingoid face  Neck: He seems to have hypertrophic soft tissues in the area of the parotid glands submandibular glands and bilateral lateral cervical areas; there is prominent supraclavicular fat pad; he appears to have a "buffalo hump. It is very hard to see jugular venous pulsations and there is no hepatojugular reflux; brisk carotid pulses without delay and no carotid bruits  Chest: clear to auscultation, no signs of consolidation by percussion or palpation, normal fremitus, symmetrical and full respiratory excursions; I do not see any evidence of collateral vein circulation; the superior pacemaker site appears healthy  Cardiovascular: normal position and quality of the apical impulse, regular rhythm, normal first and second heart sounds, no murmurs, rubs or gallops  Abdomen: no tenderness or distention, no masses by palpation, no abnormal pulsatility or arterial bruits, normal bowel sounds, no hepatosplenomegaly  Extremities: no clubbing, cyanosis or edema; 2+ radial, ulnar and brachial pulses bilaterally; 2+ right femoral, posterior tibial and dorsalis pedis pulses; 2+ left femoral, posterior tibial and dorsalis pedis pulses; no subclavian or femoral bruits  Neurological: grossly nonfocal .  EKG: Atrial paced ventricular sensed  Lipid Panel     Component Value Date/Time   CHOL  Value: 225        ATP III CLASSIFICATION:  <200     mg/dL   Desirable  161-096  mg/dL   Borderline High  >=045    mg/dL   High       * 08/20/8117 0330   TRIG 92 01/09/2010 0330   HDL 42 01/09/2010 0330   CHOLHDL 5.4 01/09/2010 0330   VLDL 18 01/09/2010 0330   LDLCALC  Value: 165        Total Cholesterol/HDL:CHD  Risk Coronary Heart Disease Risk Table                     Men   Women  1/2 Average Risk   3.4   3.3  Average Risk       5.0   4.4  2 X Average Risk   9.6   7.1  3 X Average Risk  23.4   11.0        Use the calculated Patient Ratio above and the CHD Risk Table to determine the patient's CHD Risk.        ATP III CLASSIFICATION (LDL):  <100     mg/dL   Optimal  147-829  mg/dL   Near or Above  Optimal  130-159  mg/dL   Borderline  213-086  mg/dL   High  >578     mg/dL   Very High* 4/69/6295 0330    BMET    Component Value Date/Time   NA 137 12/12/2011 0602   K 4.0 12/12/2011 0602   CL 101 12/12/2011 0602   CO2 27 12/12/2011 0602   GLUCOSE 106* 12/12/2011 0602   BUN 8 12/12/2011 0602   CREATININE 0.70 12/12/2011 0602   CALCIUM 9.0 12/12/2011 0602   GFRNONAA >90 12/12/2011 0602   GFRAA >90 12/12/2011 0602     ASSESSMENT AND PLAN Pacemaker Pacemaker check in clinic. Normal device function. Thresholds, sensing, impedances consistent with previous measurements.  96% atrial pacing, very infrequent ventricular pacing. Device programmed to maximize longevity. 5 mode switches(<1%)----max dur. 8 sec, Max A 375, Max V 94----last 01-05-2013 atrial tachycardia. No high ventricular rates noted. 1 "PMT" episode- actually brief atrial tachycardia. Device programmed at appropriate safety margins. Histogram distribution appropriate for patient activity level. Device programmed to optimize intrinsic conduction.  Estimated longevity 6->7 years. Patient will follow up with the device clinic in 09-2013 and with MD in 12 months.    No orders of the defined types were placed in this encounter.   Meds ordered this encounter  Medications  . inFLIXimab (REMICADE) 100 MG injection    Sig: Inject 400 mg into the vein every 8 (eight) weeks.  Marland Kitchen HYDROcodone-acetaminophen (NORCO) 10-325 MG per tablet    Sig: Take 1 tablet by mouth every morning.    Junious Silk, MD, [facility] [phone] office [phone] pager

## 2013-04-02 NOTE — Progress Notes (Signed)
Pacemaker check in clinic. Normal device function. Thresholds, sensing, impedances consistent with previous measurements. Device programmed to maximize longevity. 5 mode switches(<1%)----max dur. 8 sec, Max A 375, Max V 94----last 01-05-2013----AT. No high ventricular rates noted. 1 "PMT" episode---AT (PMT algorithm was ineffective). Device programmed at appropriate safety margins. Histogram distribution appropriate for patient activity level. Device programmed to optimize intrinsic conduction. Estimated longevity 6->7 years. Patient will follow up with the device clinic in 09-2013 and with [facility] in 12 months.

## 2013-04-08 ENCOUNTER — Other Ambulatory Visit: Payer: Self-pay | Admitting: *Deleted

## 2013-04-08 MED ORDER — ATENOLOL 25 MG PO TABS: 25.0000 mg | ORAL_TABLET | Freq: Every day | ORAL | Status: DC

## 2013-04-08 NOTE — Telephone Encounter (Signed)
Rx was sent to pharmacy electronically. 

## 2013-07-06 ENCOUNTER — Other Ambulatory Visit: Payer: Self-pay | Admitting: Gastroenterology

## 2013-09-14 IMAGING — CR DG CHEST 2V
2 series · 2 of 2 positions shown · non-contrast
Comparison: 10/01/2011

CLINICAL DATA: Preop right knee surgery

CHEST - 2 VIEW

[view not recorded (1 of 2)]
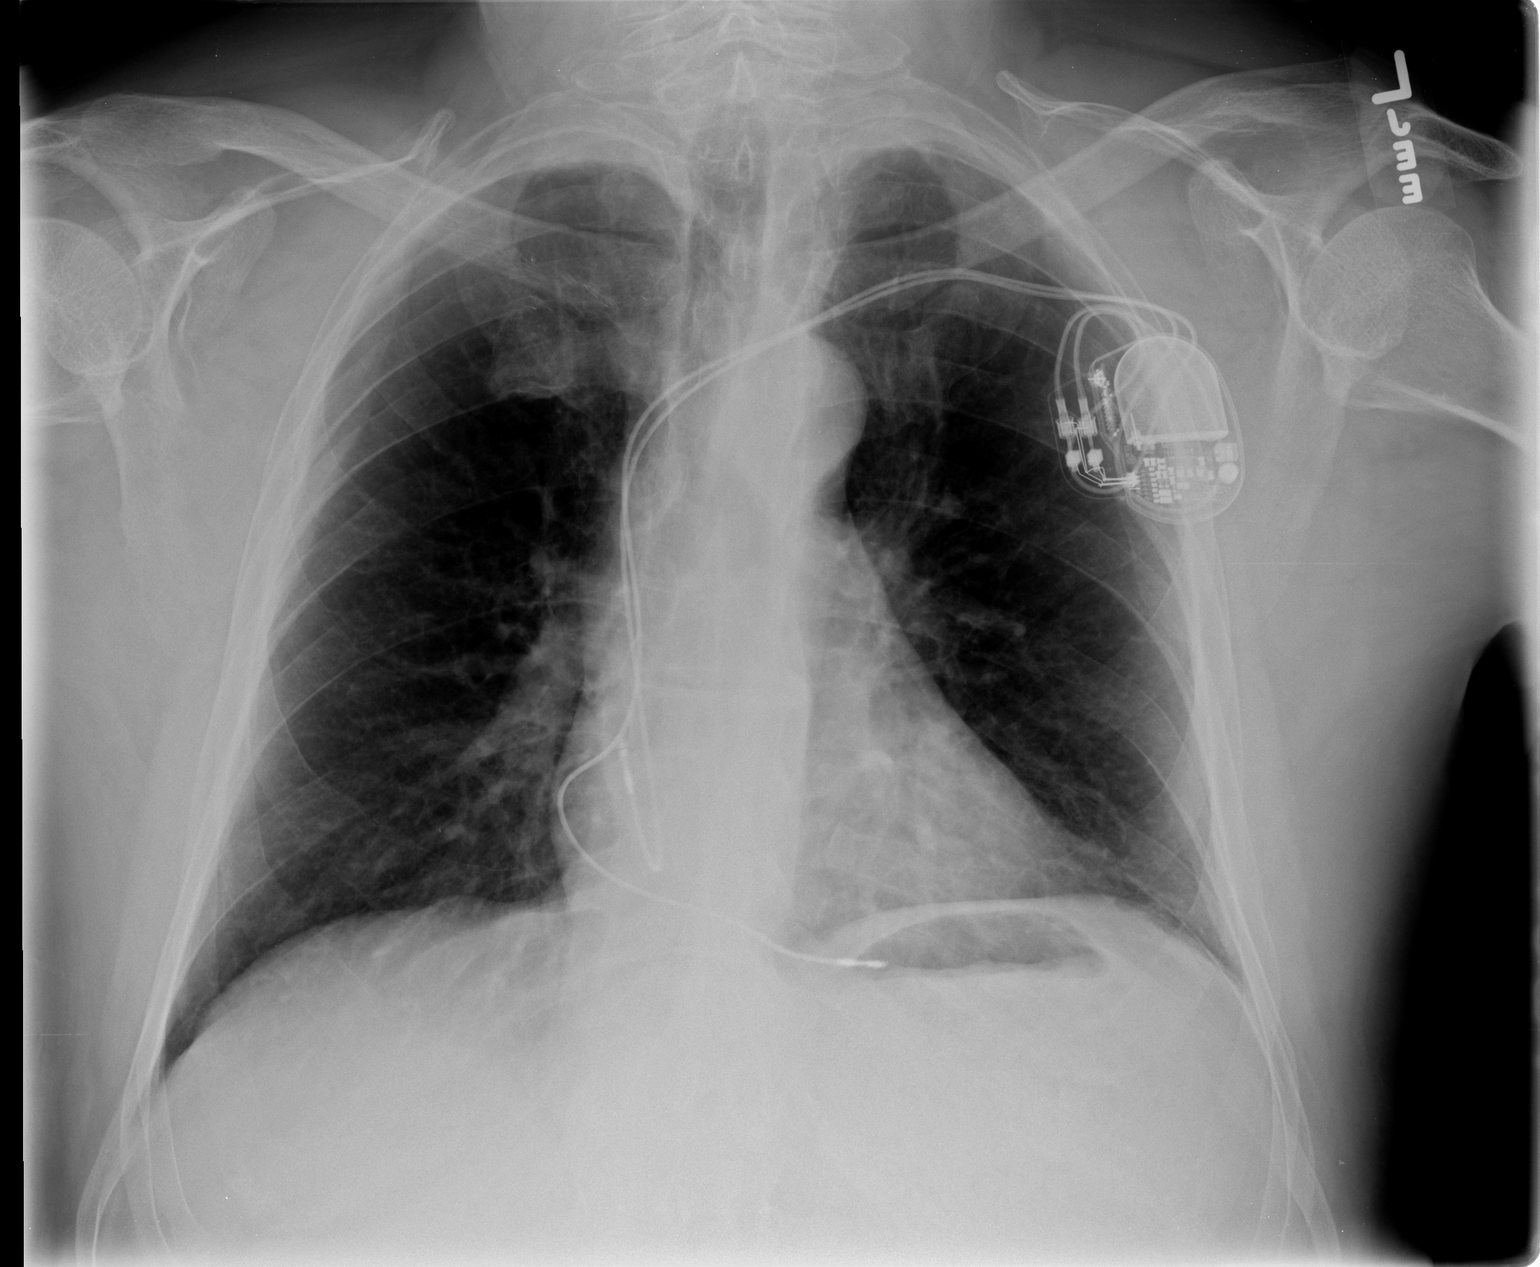

[view not recorded (2 of 2)]
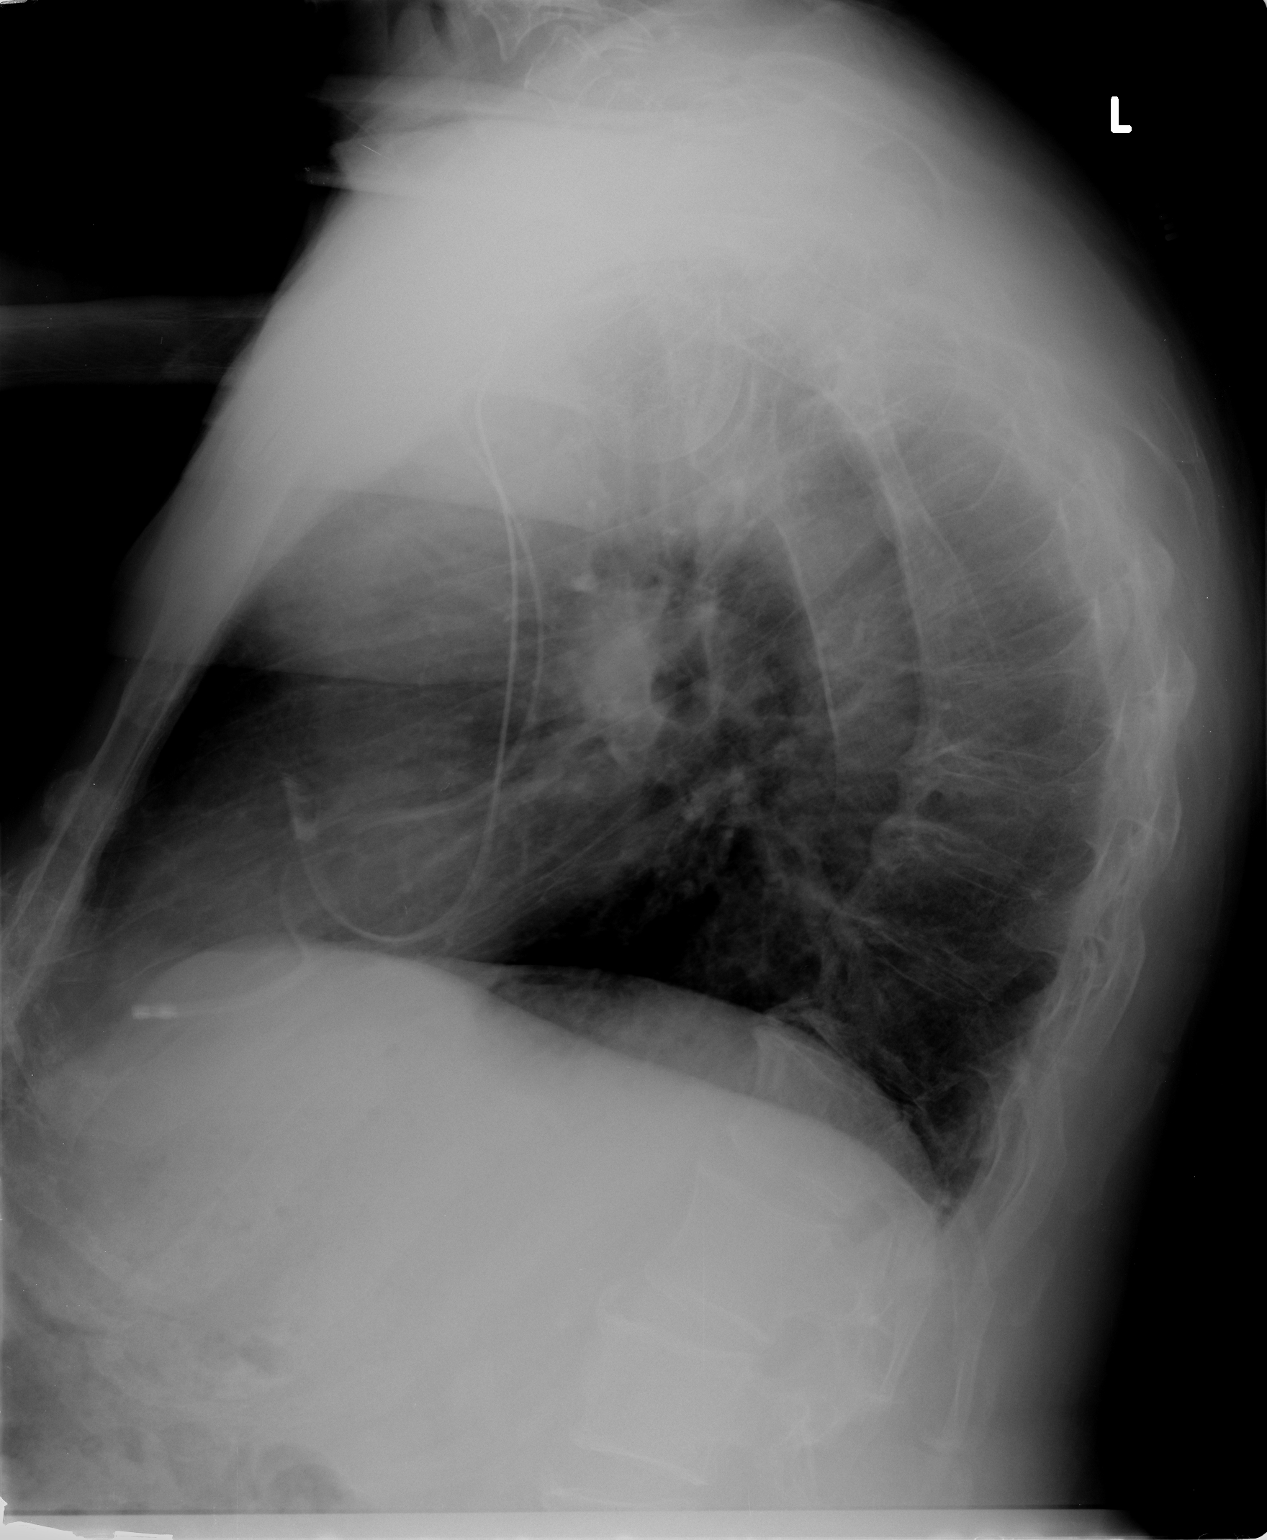

[2 of 2 positions shown; findings below may reference images not displayed]

FINDINGS: Resolution of right lower lobe infiltrate.  Lungs are now
clear without pneumonia or effusion.  Negative for heart failure.
Dual lead pacemaker unchanged.
IMPRESSION: No active cardiopulmonary disease.  Interval clearing of right
lower lobe infiltrate.

## 2013-09-18 ENCOUNTER — Encounter: Payer: Self-pay | Admitting: Cardiovascular Disease

## 2013-10-24 ENCOUNTER — Telehealth: Payer: Self-pay | Admitting: Cardiovascular Disease

## 2013-10-24 ENCOUNTER — Encounter: Payer: Self-pay | Admitting: Cardiovascular Disease

## 2013-10-26 NOTE — Telephone Encounter (Signed)
Closed encounter °

## 2013-10-29 ENCOUNTER — Encounter: Payer: Self-pay | Admitting: Cardiovascular Disease

## 2013-11-24 ENCOUNTER — Encounter: Payer: Self-pay | Admitting: Cardiovascular Disease

## 2013-11-24 ENCOUNTER — Ambulatory Visit (INDEPENDENT_AMBULATORY_CARE_PROVIDER_SITE_OTHER): Payer: Medicaid Other | Admitting: Cardiovascular Disease

## 2013-11-24 VITALS — BP 120/72 | HR 63 | Resp 16 | Ht 70.0 in | Wt 204.8 lb

## 2013-11-24 DIAGNOSIS — Z950 Presence of cardiac pacemaker: Secondary | ICD-10-CM

## 2013-11-24 DIAGNOSIS — I495 Sick sinus syndrome: Secondary | ICD-10-CM

## 2013-11-24 LAB — MDC_IDC_ENUM_SESS_TYPE_INCLINIC
Battery Remaining Longevity: 98.4 mo
Battery Voltage: 2.93 V
Brady Statistic RA Percent Paced: 98 %
Brady Statistic RV Percent Paced: 7.5 %
Date Time Interrogation Session: 20150714151811
Implantable Pulse Generator Model: 2210
Implantable Pulse Generator Serial Number: 7168017
Lead Channel Impedance Value: 387.5 Ohm
Lead Channel Impedance Value: 425 Ohm
Lead Channel Pacing Threshold Amplitude: 0.625 V
Lead Channel Pacing Threshold Amplitude: 1.75 V
Lead Channel Pacing Threshold Pulse Width: 0.4 ms
Lead Channel Pacing Threshold Pulse Width: 0.8 ms
Lead Channel Sensing Intrinsic Amplitude: 10.5 mV
Lead Channel Sensing Intrinsic Amplitude: 2.1 mV
Lead Channel Setting Pacing Amplitude: 1.625
Lead Channel Setting Pacing Amplitude: 2 V
Lead Channel Setting Pacing Pulse Width: 0.8 ms
Lead Channel Setting Sensing Sensitivity: 2 mV

## 2013-11-24 LAB — PACEMAKER DEVICE OBSERVATION

## 2013-11-24 NOTE — Patient Instructions (Addendum)
Your physician recommends that you schedule a follow-up appointment in: 6 months for a pacemaker check only.   Dr. Royann Shiversroitoru recommends that you schedule a follow-up appointment in: 12 months

## 2013-11-25 ENCOUNTER — Encounter: Payer: Self-pay | Admitting: Cardiovascular Disease

## 2013-11-25 NOTE — Assessment & Plan Note (Signed)
Normal pacemaker function. Not pacemaker dependent. Slightly high V pacing threshold unlikely to be an issue since he rarely V paces.

## 2013-11-25 NOTE — Progress Notes (Signed)
Patient ID: Roy Tucker, male   DOB: 11/26/1950, 63 y.o.   MRN: [id number]      Reason for office visit Pacemaker follow up  No cardiovascular complaints today. Normal pacemaker function in office today. 98% atrial pacing, 7% V pacing, comparable to historical trend. Underlying sinus bradycardia 50 bpm. V pacing threshold is a little high at 1.75V @ 0.8 ms, stable.  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 He has been diagnosed with rheumatoid arthritis. He had near syncope and tachycardia with Remicade and is now receiving a different biological.    Allergies  Allergen Reactions  . Aspirin Other (See Comments)    intolerance  . Methimazole [Thiamazole]     unsure  . Neurontin [Gabapentin]   . Tylenol [Acetaminophen]     Current Outpatient Prescriptions  Medication Sig Dispense Refill  . albuterol (PROVENTIL HFA;VENTOLIN HFA) 108 (90 BASE) MCG/ACT inhaler Inhale 2 puffs into the lungs every 6 (six) hours as needed for wheezing or shortness of breath.  1 Inhaler  0  . atenolol (TENORMIN) 25 MG tablet Take 1 tablet (25 mg total) by mouth daily.  30 tablet  11  . esomeprazole (NEXIUM) 40 MG capsule Take 1 capsule (40 mg total) by mouth daily before breakfast.  30 capsule  10  . HYDROcodone-acetaminophen (NORCO) 10-325 MG per tablet Take 1 tablet by mouth every morning.      . inFLIXimab (REMICADE) 100 MG injection Inject 400 mg into the vein every 8 (eight) weeks.      Marland Kitchen. levothyroxine (SYNTHROID, LEVOTHROID) 175 MCG tablet Take 175 mcg by mouth daily before breakfast.       No  current facility-administered medications for this visit.    Past Medical History  Diagnosis Date  . Hypertension   . Arthritis   . Gout   . Chronic pain   . Bradycardia   . Dysrhythmia     bradycardia  . Pacemaker   . Hyperthyroidism     graves on prednisone  . Pneumonia     few months ago  . GERD (gastroesophageal reflux disease)   . Sinus node dysfunction 01/12/2010    [employer]Jude pacemaker  . Atrial tachycardia     Past Surgical History  Procedure Laterality Date  . Pacemaker insertion  01/12/2010    St.Jude Accent DR RF  . Colonoscopy w/ polypectomy    . Total knee arthroplasty  12/10/2011    Procedure: TOTAL KNEE ARTHROPLASTY;  Surgeon: Raymon MuttonStephen D Lucey, MD;  [facility];  Service: Orthopedics;  Laterality: Right;  . Nm myocar perf wall motion  07/05/2010    no significant ischemia    No family history on file.  History   Social History  . Marital Status: Married    Spouse Name: N/A    Number of Children: N/A  . Years of Education: N/A   Occupational History  . Not on file.   Social History Main Topics  . Smoking status: Former Smoker -- 1.00 packs/day for 15 years    Types: Cigarettes    Quit date: 12/03/2003  . Smokeless tobacco: Not on file  .  Alcohol Use: No  . Drug Use: No  . Sexual Activity: Not Currently   Other Topics Concern  . Not on file   Social History Narrative  . No narrative on file    Review of systems: The patient specifically denies any chest pain at rest or with exertion, dyspnea at rest or with exertion, orthopnea, paroxysmal nocturnal dyspnea, syncope, palpitations, focal neurological deficits, intermittent claudication, lower extremity edema, unexplained weight gain, cough, hemoptysis or wheezing.  The patient also denies abdominal pain, nausea, vomiting, dysphagia, diarrhea, constipation, polyuria, polydipsia, dysuria, hematuria, frequency, urgency, abnormal bleeding or bruising, fever, chills, unexpected weight changes, mood  swings, change in skin or hair texture, change in voice quality, auditory or visual problems, allergic reactions or rashes, new musculoskeletal complaints other than usual "aches and pains".   PHYSICAL EXAM BP 120/72  Pulse 63  Resp 16  Ht 5\' 10"  (1.778 m)  Wt 204 lb 12.8 oz (92.897 kg)  BMI 29.39 kg/m2  General: Alert, oriented x3, no distress Head: no evidence of trauma, PERRL, EOMI, no exophtalmos or lid lag, no myxedema, no xanthelasma; normal ears, nose and oropharynx Neck: normal jugular venous pulsations and no hepatojugular reflux; brisk carotid pulses without delay and no carotid bruits Chest: clear to auscultation, no signs of consolidation by percussion or palpation, normal fremitus, symmetrical and full respiratory excursions Cardiovascular: normal position and quality of the apical impulse, regular rhythm, normal first and second heart sounds, no murmurs, rubs or gallops Abdomen: no tenderness or distention, no masses by palpation, no abnormal pulsatility or arterial bruits, normal bowel sounds, no hepatosplenomegaly Extremities: no clubbing, cyanosis or edema; 2+ radial, ulnar and brachial pulses bilaterally; 2+ right femoral, posterior tibial and dorsalis pedis pulses; 2+ left femoral, posterior tibial and dorsalis pedis pulses; no subclavian or femoral bruits Neurological: grossly nonfocal  EKG: Apaced V sensed, otherwise normal  BMET    Component Value Date/Time   NA 137 12/12/2011 0602   K 4.0 12/12/2011 0602   CL 101 12/12/2011 0602   CO2 27 12/12/2011 0602   GLUCOSE 106* 12/12/2011 0602   BUN 8 12/12/2011 0602   CREATININE 0.70 12/12/2011 0602   CALCIUM 9.0 12/12/2011 0602   GFRNONAA >90 12/12/2011 0602   GFRAA >90 12/12/2011 0602     ASSESSMENT AND PLAN Pacemaker Normal pacemaker function. Not pacemaker dependent. Slightly high V pacing threshold unlikely to be an issue since he rarely V paces.   Patient Instructions  Your physician recommends that you schedule  a follow-up appointment in: 6 months for a pacemaker check only.   Dr. Royann Shivers recommends that you schedule a follow-up appointment in: 12 months         Orders Placed This Encounter  Procedures  . Implantable device check   No orders of the defined types were placed in this encounter.    Junious Silk, MD, [facility] [phone] office [phone] pager

## 2013-11-26 ENCOUNTER — Ambulatory Visit: Payer: Medicaid Other | Admitting: Cardiovascular Disease

## 2013-12-01 ENCOUNTER — Ambulatory Visit: Payer: Medicaid Other | Admitting: Cardiovascular Disease

## 2013-12-07 ENCOUNTER — Other Ambulatory Visit: Payer: Self-pay | Admitting: *Deleted

## 2013-12-07 MED ORDER — OMEPRAZOLE 40 MG PO CPDR: 40.0000 mg | DELAYED_RELEASE_CAPSULE | Freq: Every day | ORAL | Status: DC

## 2013-12-07 NOTE — Telephone Encounter (Signed)
Received a PA from insurance regarding nexium. Per dr croitoru pt to change to omeprazole 40 mg daily New script sent into the pharm.

## 2013-12-08 ENCOUNTER — Encounter: Payer: Self-pay | Admitting: Cardiovascular Disease

## 2013-12-10 ENCOUNTER — Other Ambulatory Visit (HOSPITAL_COMMUNITY): Payer: Self-pay | Admitting: Orthopedic Surgery

## 2013-12-10 DIAGNOSIS — M25561 Pain in right knee: Secondary | ICD-10-CM

## 2013-12-18 ENCOUNTER — Encounter (HOSPITAL_COMMUNITY)
Admission: RE | Admit: 2013-12-18 | Discharge: 2013-12-18 | Disposition: A | Discharge status: Home / Self Care | Payer: Medicaid Other | Source: Ambulatory Visit | Attending: Orthopedic Surgery | Admitting: Orthopedic Surgery

## 2013-12-18 ENCOUNTER — Ambulatory Visit (HOSPITAL_COMMUNITY)
Admission: RE | Admit: 2013-12-18 | Discharge: 2013-12-18 | Disposition: A | Discharge status: Home / Self Care | Payer: Medicaid Other | Source: Ambulatory Visit | Attending: Orthopedic Surgery | Admitting: Orthopedic Surgery

## 2013-12-18 DIAGNOSIS — Y849 Medical procedure, unspecified as the cause of abnormal reaction of the patient, or of later complication, without mention of misadventure at the time of the procedure: Secondary | ICD-10-CM | POA: Insufficient documentation

## 2013-12-18 DIAGNOSIS — T8489XA Other specified complication of internal orthopedic prosthetic devices, implants and grafts, initial encounter: Secondary | ICD-10-CM | POA: Insufficient documentation

## 2013-12-18 DIAGNOSIS — M25569 Pain in unspecified knee: Secondary | ICD-10-CM | POA: Diagnosis not present

## 2013-12-18 DIAGNOSIS — M25561 Pain in right knee: Secondary | ICD-10-CM

## 2013-12-18 MED ORDER — TECHNETIUM TC 99M MEDRONATE IV KIT
25.0000 | PACK | Freq: Once | INTRAVENOUS | Status: AC | PRN
  Administered 2013-12-18: 25 via INTRAVENOUS

## 2014-04-01 ENCOUNTER — Other Ambulatory Visit: Payer: Self-pay | Admitting: *Deleted

## 2014-04-01 MED ORDER — ATENOLOL 25 MG PO TABS: 25.0000 mg | ORAL_TABLET | Freq: Every day | ORAL | Status: DC

## 2014-04-01 NOTE — Telephone Encounter (Signed)
Refilled electronically 

## 2014-06-17 ENCOUNTER — Encounter: Payer: Self-pay | Admitting: *Deleted

## 2014-08-09 ENCOUNTER — Telehealth: Payer: Self-pay | Admitting: Cardiovascular Disease

## 2014-08-16 NOTE — Telephone Encounter (Signed)
Close encounter 

## 2014-09-07 ENCOUNTER — Ambulatory Visit (INDEPENDENT_AMBULATORY_CARE_PROVIDER_SITE_OTHER): Payer: Medicaid Other | Admitting: Cardiovascular Disease

## 2014-09-07 ENCOUNTER — Encounter: Payer: Self-pay | Admitting: Cardiovascular Disease

## 2014-09-07 VITALS — BP 114/66 | HR 77 | Ht 70.0 in | Wt 206.1 lb

## 2014-09-07 DIAGNOSIS — Z950 Presence of cardiac pacemaker: Secondary | ICD-10-CM

## 2014-09-07 DIAGNOSIS — I495 Sick sinus syndrome: Secondary | ICD-10-CM

## 2014-09-07 LAB — MDC_IDC_ENUM_SESS_TYPE_INCLINIC
Battery Remaining Percentage: 73 %
Battery Voltage: 2.92 V
Brady Statistic RA Percent Paced: 98 %
Brady Statistic RV Percent Paced: 7.1 %
Implantable Pulse Generator Model: 2210
Implantable Pulse Generator Serial Number: 7168017
Lead Channel Impedance Value: 360 Ohm
Lead Channel Impedance Value: 430 Ohm
Lead Channel Pacing Threshold Amplitude: 0.625 V
Lead Channel Pacing Threshold Amplitude: 1.75 V
Lead Channel Pacing Threshold Pulse Width: 0.4 ms
Lead Channel Pacing Threshold Pulse Width: 0.8 ms
Lead Channel Sensing Intrinsic Amplitude: 10.1 mV
Lead Channel Sensing Intrinsic Amplitude: 2.3 mV
Lead Channel Setting Pacing Amplitude: 1.625
Lead Channel Setting Pacing Amplitude: 2 V
Lead Channel Setting Pacing Pulse Width: 0.8 ms
Lead Channel Setting Sensing Sensitivity: 2 mV

## 2014-09-07 MED ORDER — ATENOLOL 25 MG PO TABS: 12.5000 mg | ORAL_TABLET | Freq: Every day | ORAL | Status: DC

## 2014-09-07 NOTE — Patient Instructions (Signed)
DECREASE Atenolol to 25mg  1/2 tablet daily.  Dr. Royann Shiversroitoru recommends that you schedule a follow-up appointment in: 6 months.

## 2014-09-07 NOTE — Progress Notes (Signed)
Patient ID: Roy FlesherDonald R Schabel, male   DOB: 12/27/1950, 64 y.o.   MRN: [id number]     Cardiology Office Note   Date:  09/07/2014   ID:  Roy Tucker, DOB 03/14/1951, MRN [id number]  PCP:  Egbert GaribaldiMillsaps, KIMBERLY M, NP  Cardiologist:   Thurmon FairROITORU,Brockton Mckesson, MD   Chief Complaint  Patient presents with  . Annual Exam    patient reports arm pain/"tingling," occasional chest discomfort and shortness of breath      History of Present Illness: Roy FlesherDonald R Tucker is a 64 y.o. male who presents for pacemaker follow-up. He is 64 years old and had a dual-chamber permanent pacemaker implantation in 2011 for symptomatic sinus bradycardia.   He has rheumatoid arthritis and is on chronic treatment with Remicade. His complaints are fatigue and sluggishness and sleepiness. He reports a normal TSH checked about 2 months ago. Heart rate histogram distribution shows normal pattern for a very sedentary patient. We walked around the office today and he promptly increases heart rate to 120 bpm suggesting he does not have chronotropic incompetence. His blood pressure has been consistently normal.  Interrogation of his pacemaker shows 98% atrial pacing and 7% ventricular pacing. There have been very infrequent episodes of mode switch the longest of which lasted for only 8 seconds. His ventricular pacing threshold is slightly higher than desirable, but this has not been an issue since he very infrequently requires ventricular pacing.  Past Medical History  Diagnosis Date  . Hypertension   . Arthritis   . Gout   . Chronic pain   . Bradycardia   . Dysrhythmia     bradycardia  . Pacemaker   . Hyperthyroidism     graves on prednisone  . Pneumonia     few months ago  . GERD (gastroesophageal reflux disease)   . Sinus node dysfunction 01/12/2010    St.Jude pacemaker  . Atrial tachycardia     Past Surgical History  Procedure Laterality Date  . Pacemaker insertion  01/12/2010    St.Jude Accent DR RF  . Colonoscopy  w/ polypectomy    . Total knee arthroplasty  12/10/2011    Procedure: TOTAL KNEE ARTHROPLASTY;  Surgeon: Raymon MuttonStephen D Lucey, MD;  [facility];  Service: Orthopedics;  Laterality: Right;  . Nm myocar perf wall motion  07/05/2010    no significant ischemia     Current Outpatient Prescriptions  Medication Sig Dispense Refill  . albuterol (PROVENTIL HFA;VENTOLIN HFA) 108 (90 BASE) MCG/ACT inhaler Inhale 2 puffs into the lungs every 6 (six) hours as needed for wheezing or shortness of breath. 1 Inhaler 0  . atenolol (TENORMIN) 25 MG tablet Take 0.5 tablets (12.5 mg total) by mouth daily. 15 tablet 11  . HYDROcodone-acetaminophen (NORCO) 10-325 MG per tablet Take 1 tablet by mouth every morning.    . inFLIXimab (REMICADE) 100 MG injection Inject 400 mg into the vein every 8 (eight) weeks.    Marland Kitchen. levothyroxine (SYNTHROID, LEVOTHROID) 175 MCG tablet Take 175 mcg by mouth daily before breakfast.     No current facility-administered medications for this visit.    Allergies:   Aspirin; Methimazole; Neurontin; and Tylenol    Social History:  The patient  reports that he quit smoking about 10 years ago. His smoking use included Cigarettes. He has a 15 pack-year smoking history. He does not have any smokeless tobacco history on file. He reports that he does not drink alcohol or use illicit drugs.   ROS:  Please see the history  of present illness.    Otherwise, review of systems positive for none.   All other systems are reviewed and negative.    PHYSICAL EXAM: VS:  BP 114/66 mmHg  Pulse 77  Ht  (1.778 m)  Wt 206 lb 1.6 oz (93.486 kg)  BMI 29.57 kg/m2 , BMI Body mass index is 29.57 kg/(m^2).  General: Alert, oriented x3, no distress Head: no evidence of trauma, PERRL, EOMI, no exophtalmos or lid lag, no myxedema, no xanthelasma; normal ears, nose and oropharynx Neck: normal jugular venous pulsations and no hepatojugular reflux; brisk carotid pulses without delay and no carotid  bruits Chest: clear to auscultation, no signs of consolidation by percussion or palpation, normal fremitus, symmetrical and full respiratory excursions Cardiovascular: normal position and quality of the apical impulse, regular rhythm, normal first and second heart sounds, no murmurs, rubs or gallops Abdomen: no tenderness or distention, no masses by palpation, no abnormal pulsatility or arterial bruits, normal bowel sounds, no hepatosplenomegaly Extremities: no clubbing, cyanosis or edema; 2+ radial, ulnar and brachial pulses bilaterally; 2+ right femoral, posterior tibial and dorsalis pedis pulses; 2+ left femoral, posterior tibial and dorsalis pedis pulses; no subclavian or femoral bruits Neurological: grossly nonfocal Psych: euthymic mood, full affect   EKG:  EKG is ordered today. The ekg ordered today demonstrates atrial paced ventricular sensed otherwise normal   Recent Labs: No results found for requested labs within last 365 days.      Wt Readings from Last 3 Encounters:  09/07/14 206 lb 1.6 oz (93.486 kg)  11/24/13 204 lb 12.8 oz (92.897 kg)  04/02/13 207 lb 4.8 oz (94.031 kg)      ASSESSMENT AND PLAN:  1.  Sinus node dysfunction with normally functioning dual-chamber permanent pacemaker. No reprogramming was necessary today. I don't think his symptoms of fatigue are related to arrhythmia. Recommend that he decrease his atenolol to 12.5 mg daily and have a low threshold for discontinuation of this medication.   Current medicines are reviewed at length with the patient today.  The patient has concerns regarding medicines.  The following changes have been made:  Reduce atenolol to 12.5 mg daily  Labs/ tests ordered today include:  Orders Placed This Encounter  Procedures  . EKG 12-Lead    Patient Instructions  DECREASE Atenolol to  1/2 tablet daily.  Dr. Royann Shivers recommends that you schedule a follow-up appointment in: 6  months.       Joie Bimler, MD  09/07/2014 3:04 PM    Thurmon Fair, MD, [facility] [phone] office [phone] pager

## 2014-10-18 ENCOUNTER — Ambulatory Visit (INDEPENDENT_AMBULATORY_CARE_PROVIDER_SITE_OTHER): Payer: Medicaid Other | Admitting: Podiatry

## 2014-10-18 ENCOUNTER — Ambulatory Visit (INDEPENDENT_AMBULATORY_CARE_PROVIDER_SITE_OTHER): Payer: Medicaid Other

## 2014-10-18 ENCOUNTER — Encounter: Payer: Self-pay | Admitting: Podiatry

## 2014-10-18 VITALS — BP 131/82 | HR 60 | Resp 12

## 2014-10-18 DIAGNOSIS — R52 Pain, unspecified: Secondary | ICD-10-CM

## 2014-10-18 DIAGNOSIS — M205X9 Other deformities of toe(s) (acquired), unspecified foot: Secondary | ICD-10-CM

## 2014-10-18 DIAGNOSIS — L84 Corns and callosities: Secondary | ICD-10-CM

## 2014-10-18 NOTE — Progress Notes (Signed)
   Subjective:    Patient ID: Roy Tucker, male    DOB: 04/25/1951, 64 y.o.   MRN: [id number]  HPI  64 year old male presents the office today with complaints of pain the both of his fifth toes. He states that he has a painful lesion to both of his fifth toes of the right greater than left. He states that this has been ongoing for possibly 6 months. He has been soaking his foot in Epson salts without much relief. He also states he previously felt primary care physician and was given an antibiotic. He states that despite both these treatments he continues to have pain.  He doesn't the pain is increased and wearing shoes or pressure to the area. He denies any history of injury or trauma. No other complaints at this time.   Review of Systems  Musculoskeletal: Positive for back pain and joint swelling.       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 Along the medial aspect of the fifth digits bilaterally along the level of the DIPJ there is annular hyperkeratotic lesions with a right more significant in the left. Upon debridement there was some dried blood underneath the callus of the right foot. Upon further debridement the underlying skin is intact there is no drainage, erythema , ulceration, or any other clinical signs of infection. There is no edema to the digits. No other areas of tenderness to bilateral lower extremities. MMT 5/5, ROM WNL.  No open lesions or pre-ulcerative lesions.  No overlying edema, erythema, increase in warmth to bilateral lower extremities.  No pain with calf compression, swelling, warmth, erythema bilaterally.      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  There are exostosis prominent over one side of the hyperkeratotic lesions. -Treatment options discussed including all alternatives, risks, and complications -Hyperkeratotic lesions were sharply debrided without complications last bleeding 2. -Dispensed offloading pads. -Discussed possible surgical intervention. However given his rheumatoid arthritis and on Remicade, he would likely need to be off the medication to help ensure post-operative healing. However, I would defer this to the patients rheumatologist should he undergo surgery. Also, he would need medical clearance prior to surgery.  -Follow up his symptoms are not completely resolved next couple weeks or sooner if any problems are to arise. In the meantime I encouraged him call the office with any questions, concerns, change in symptoms.

## 2014-11-10 ENCOUNTER — Encounter (HOSPITAL_COMMUNITY): Payer: Self-pay | Admitting: Emergency Medicine

## 2014-11-10 ENCOUNTER — Emergency Department (HOSPITAL_COMMUNITY)
Admission: EM | Admit: 2014-11-10 | Discharge: 2014-11-10 | Disposition: A | Discharge status: Home / Self Care | Payer: Medicaid Other | Attending: Emergency Medicine | Admitting: Emergency Medicine

## 2014-11-10 ENCOUNTER — Emergency Department (HOSPITAL_COMMUNITY): Payer: Medicaid Other

## 2014-11-10 DIAGNOSIS — Z8719 Personal history of other diseases of the digestive system: Secondary | ICD-10-CM | POA: Diagnosis not present

## 2014-11-10 DIAGNOSIS — E0590 Thyrotoxicosis, unspecified without thyrotoxic crisis or storm: Secondary | ICD-10-CM | POA: Insufficient documentation

## 2014-11-10 DIAGNOSIS — M25512 Pain in left shoulder: Secondary | ICD-10-CM | POA: Diagnosis not present

## 2014-11-10 DIAGNOSIS — R079 Chest pain, unspecified: Secondary | ICD-10-CM | POA: Insufficient documentation

## 2014-11-10 DIAGNOSIS — M1990 Unspecified osteoarthritis, unspecified site: Secondary | ICD-10-CM | POA: Diagnosis not present

## 2014-11-10 DIAGNOSIS — Z7951 Long term (current) use of inhaled steroids: Secondary | ICD-10-CM | POA: Diagnosis not present

## 2014-11-10 DIAGNOSIS — G8929 Other chronic pain: Secondary | ICD-10-CM | POA: Diagnosis not present

## 2014-11-10 DIAGNOSIS — Z8701 Personal history of pneumonia (recurrent): Secondary | ICD-10-CM | POA: Diagnosis not present

## 2014-11-10 DIAGNOSIS — Z87891 Personal history of nicotine dependence: Secondary | ICD-10-CM | POA: Diagnosis not present

## 2014-11-10 DIAGNOSIS — I10 Essential (primary) hypertension: Secondary | ICD-10-CM | POA: Diagnosis not present

## 2014-11-10 DIAGNOSIS — Z79899 Other long term (current) drug therapy: Secondary | ICD-10-CM | POA: Diagnosis not present

## 2014-11-10 DIAGNOSIS — Z950 Presence of cardiac pacemaker: Secondary | ICD-10-CM | POA: Diagnosis not present

## 2014-11-10 LAB — BASIC METABOLIC PANEL
Anion gap: 10 (ref 5–15)
BUN: 11 mg/dL (ref 6–20)
CO2: 23 mmol/L (ref 22–32)
Calcium: 8.9 mg/dL (ref 8.9–10.3)
Chloride: 103 mmol/L (ref 101–111)
Creatinine, Ser: 0.8 mg/dL (ref 0.61–1.24)
GFR calc Af Amer: >60 mL/min (ref >60)
GFR calc non Af Amer: >60 mL/min (ref >60)
Glucose, Bld: 116 mg/dL — ABNORMAL HIGH (ref 65–99)
Potassium: 4 mmol/L (ref 3.5–5.1)
Sodium: 136 mmol/L (ref 135–145)

## 2014-11-10 LAB — I-STAT TROPONIN, ED: Troponin i, poc: 0 ng/mL (ref 0.00–0.08)

## 2014-11-10 LAB — CBC
HCT: 38.2 % — ABNORMAL LOW (ref 39.0–52.0)
Hemoglobin: 13.5 g/dL (ref 13.0–17.0)
MCH: 29.5 pg (ref 26.0–34.0)
MCHC: 35.3 g/dL (ref 30.0–36.0)
MCV: 83.4 fL (ref 78.0–100.0)
Platelets: 108 10*3/uL — ABNORMAL LOW (ref 150–400)
RBC: 4.58 MIL/uL (ref 4.22–5.81)
RDW: 13.5 % (ref 11.5–15.5)
WBC: 6.6 10*3/uL (ref 4.0–10.5)

## 2014-11-10 MED ORDER — OXYCODONE-ACETAMINOPHEN 5-325 MG PO TABS: 1.0000 | ORAL_TABLET | Freq: Four times a day (QID) | ORAL | Status: DC | PRN

## 2014-11-10 MED ORDER — KETOROLAC TROMETHAMINE 30 MG/ML IJ SOLN
30.0000 mg | Freq: Once | INTRAMUSCULAR | Status: AC
  Administered 2014-11-10: 30 mg via INTRAMUSCULAR
  Filled 2014-11-10: qty 1

## 2014-11-10 MED ORDER — OXYCODONE-ACETAMINOPHEN 5-325 MG PO TABS
1.0000 | ORAL_TABLET | Freq: Once | ORAL | Status: AC
  Administered 2014-11-10: 1 via ORAL
  Filled 2014-11-10: qty 1

## 2014-11-10 NOTE — ED Provider Notes (Signed)
CSN: [id number]     Arrival date & time 11/10/14  4098 History   First MD Initiated Contact with Patient 11/10/14 [id number]     Chief Complaint  Patient presents with  . 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 Pain started last evening at approximately 8 PM and has been constant since that time. Patient had difficulty sleeping because of the pain. Denies acute injury however was using a weed eater yesterday in his yard. Pain is made worse with movement of the left arm. Patient took Vicodin overnight without relief. No associated lightheadedness, syncope, shortness of breath, palpitations or diaphoresis. Patient denies other medical complaints. He has a little soreness in the area around his pacemaker pocket. The onset of this condition was acute.   Patient is a 64 y.o. male presenting with arm pain. The history is provided by the patient and medical records.  Arm Pain Associated symptoms include arthralgias and chest pain. Pertinent negatives include no abdominal pain, coughing, diaphoresis, fever, nausea, neck pain, rash or vomiting.    Past Medical History  Diagnosis Date  . Hypertension   . Arthritis   . Gout   . Chronic pain   . Bradycardia   . Dysrhythmia     bradycardia  . Pacemaker   . Hyperthyroidism     graves on prednisone  . Pneumonia     few months ago  . GERD (gastroesophageal reflux disease)   . Sinus node dysfunction 01/12/2010    St.Jude pacemaker  . Atrial tachycardia    Past Surgical History  Procedure Laterality Date  . Pacemaker insertion  01/12/2010    St.Jude Accent DR RF  . Colonoscopy w/ polypectomy    . Total knee arthroplasty  12/10/2011    Procedure: TOTAL KNEE ARTHROPLASTY;  Surgeon: Raymon Mutton, MD;  Location: [facility] OR;   Service: Orthopedics;  Laterality: Right;  . Nm myocar perf wall motion  07/05/2010    no significant ischemia   No family history on file. History  Substance Use Topics  . Smoking status: Former Smoker -- 1.00 packs/day for 15 years    Types: Cigarettes    Quit date: 12/03/2003  . Smokeless tobacco: Not on file  . Alcohol Use: No    Review of Systems  Constitutional: Negative for fever and diaphoresis.  Eyes: Negative for redness.  Respiratory: Negative for cough and shortness of breath.   Cardiovascular: Positive for chest pain. Negative for palpitations and leg swelling.  Gastrointestinal: Negative for nausea, vomiting and abdominal pain.  Genitourinary: Negative for dysuria.  Musculoskeletal: Positive for arthralgias. Negative for back pain and neck pain.  Skin: Negative for rash.  Neurological: Negative for syncope and light-headedness.  Psychiatric/Behavioral: The patient is not nervous/anxious.       Allergies  Aspirin; Methimazole; and Neurontin  Home Medications   Prior to Admission medications   Medication Sig Start Date End Date Taking? Authorizing Provider  albuterol (PROVENTIL HFA;VENTOLIN HFA) 108 (90 BASE) MCG/ACT inhaler Inhale 2 puffs into the lungs every 6 (six) hours as needed for wheezing or shortness of breath. 10/01/11   Quita Skye, MD  atenolol (TENORMIN) 25 MG tablet Take 0.5 tablets (12.5 mg total) by mouth daily. 09/07/14   Mihai Croitoru, MD  HYDROcodone-acetaminophen (NORCO) 10-325 MG per tablet Take 1 tablet by mouth every morning.  Historical Provider, MD  inFLIXimab (REMICADE) 100 MG injection Inject 400 mg into the vein every 8 (eight) weeks.    Historical Provider, MD  levothyroxine (SYNTHROID, LEVOTHROID) 175 MCG tablet Take 175 mcg by mouth daily before breakfast.    Historical Provider, MD   BP 124/77 mmHg  Pulse 61  Temp(Src) 97.8 F (36.6 C) (Oral)  Resp 18  SpO2 99%   Physical Exam  Constitutional: He appears well-developed and  well-nourished.  HENT:  Head: Normocephalic and atraumatic.  Mouth/Throat: Mucous membranes are normal. Mucous membranes are not dry.  Eyes: Conjunctivae are normal.  Neck: Trachea normal and normal range of motion. Neck supple. Normal carotid pulses and no JVD present. No muscular tenderness present. Carotid bruit is not present. No tracheal deviation present.  Cardiovascular: Normal rate, regular rhythm, S1 normal, S2 normal, normal heart sounds and intact distal pulses.  Exam reveals no distant heart sounds and no decreased pulses.   No murmur heard. Pulses:      Radial pulses are 2+ on the right side, and 2+ on the left side.  Pulmonary/Chest: Effort normal and breath sounds normal. No respiratory distress. He has no wheezes. He exhibits tenderness.  Pacemaker pocket without signs of infection or swelling. Mild tenderness with palpation of left lateral chest wall.  Abdominal: Soft. Normal aorta and bowel sounds are normal. There is no tenderness. There is no rebound and no guarding.  Musculoskeletal: He exhibits tenderness. He exhibits no edema.       Left shoulder: He exhibits decreased range of motion, tenderness and pain. He exhibits no bony tenderness and no spasm.       Left elbow: Normal.       Left wrist: Normal.       Cervical back: Normal.       Left upper arm: Normal.       Left forearm: Normal.       Left hand: Normal.  Patient winces with abduction of left arm stating he has pain in his shoulder.  Neurological: He is alert.  Skin: Skin is warm and dry. He is not diaphoretic. No cyanosis. No pallor.  Psychiatric: He has a normal mood and affect.  Nursing note and vitals reviewed.   ED Course  Procedures (including critical care time) Labs Review Labs Reviewed  CBC - Abnormal; Notable for the following:    HCT 38.2 (*)    Platelets 108 (*)    All other components within normal limits  BASIC METABOLIC PANEL - Abnormal; Notable for the following:    Glucose, Bld 116  (*)    All other components within normal limits  Rosezena SensorI-STAT TROPOININ, ED    Imaging Review Dg Chest 2 View  11/10/2014   CLINICAL DATA:  Arm pain. No injury. Inability to move the LEFT arm and shoulder.  EXAM: CHEST  2 VIEW  COMPARISON:  12/03/2011.  FINDINGS: Cardiopericardial silhouette within normal limits. Mediastinal contours are normal. Bilateral pleural apical scarring. Two lead LEFT subclavian cardiac pacemaker is unchanged. There is no airspace disease or pleural effusion. Chronic exaggerated thoracic kyphosis with mid thoracic compression fractures. Thoracic spine DISH. Grossly, LEFT shoulder appears within normal limits.  IMPRESSION: No active cardiopulmonary disease.   Electronically Signed   By: Andreas NewportGeoffrey  Lamke M.D.   On: 11/10/2014 08:44   Dg Shoulder Left  11/10/2014   CLINICAL DATA:  Acute onset decreased range of motion  EXAM: LEFT SHOULDER - 2+ VIEW  COMPARISON:  Left knee MRI November 01, 2008  FINDINGS: Internal rotation, external rotation, and Y scapular images were obtained. There is no fracture or dislocation. There is mild generalized narrowing of the glenohumeral and acromioclavicular joints. Downsloping acromion noted on prior MR remains. No erosive change. No intra-articular calcification. Visualized left lung clear.  IMPRESSION: Osteoarthritic change.  No fracture or dislocation.   Electronically Signed   By: Bretta Bang III M.D.   On: 11/10/2014 08:41     EKG Interpretation   Date/Time:  Wednesday November 10 2014 07:00:11 EDT Ventricular Rate:  60 PR Interval:  143 QRS Duration: 91 QT Interval:  409 QTC Calculation: 409 R Axis:   4 Text Interpretation:  Sinus rhythm Minimal ST elevation, inferior leads  When compared with ECG of 10/01/2011, No significant change was found  Confirmed by [facility]  MD, DAVID ([id number]) on 11/10/2014 7:07:37 AM       6:59 AM Patient seen and examined. Work-up initiated. Medications ordered.   Vital signs reviewed and are as follows: BP  124/77 mmHg  Pulse 61  Temp(Src) 97.8 F (36.6 C) (Oral)  Resp 18  SpO2 99%  8:54 AM Patient feels better after Toradol. Will give sling, pain medication for home. Will give Percocet in place of Vicodin for short course. Encourage patient to follow-up with PCP next week if not improving.  Return to the emergency department with worsening uncontrolled symptoms, new symptoms, chest pain or shortness of breath, fever, or other concerns.   MDM   Final diagnoses:  Left shoulder pain   Patient with left shoulder pain, reproducible, not consistent with cardiac origin. Cardiovascular workup performed given history and is negative. No abdominal pain to suggest diaphragmatic irritation. Patient ready for discharge to home. He will follow-up with PCP if still having trouble in one week. Pain control, sling, rice protocol discussed with patient. Left upper extremity is neurovascularly intact with normal pulses.  No dangerous or life-threatening conditions suspected or identified by history, physical exam, and by work-up. No indications for hospitalization identified.     Renne Crigler, PA-C 11/10/14 0901  Cathren Laine, MD 11/10/14 1520

## 2014-11-10 NOTE — Discharge Instructions (Signed)
Please read and follow all provided instructions.  Your diagnoses today include:  1. Left shoulder pain    Tests performed today include:  An x-ray of the affected area - does NOT show any broken bones  Blood counts and electrolytes  Blood test for heart attack - negative  Vital signs. See below for your results today.   Medications prescribed:   Percocet (oxycodone/acetaminophen) - narcotic pain medication  DO NOT drive or perform any activities that require you to be awake and alert because this medicine can make you drowsy. BE VERY CAREFUL not to take multiple medicines containing Tylenol (also called acetaminophen). Doing so can lead to an overdose which can damage your liver and cause liver failure and possibly death.  Do not combine this medication with your home Vicodin. Take one or the other, not both.   Take any prescribed medications only as directed.  Home care instructions:   Follow any educational materials contained in this packet  Follow R.I.C.E. Protocol:  R - rest your injury   I  - use ice on injury without applying directly to skin  C - compress injury with bandage or splint  E - elevate the injury as much as possible  Follow-up instructions: Please follow-up with your primary care provider if you continue to have significant pain in 1 week. In this case you may have a more severe injury that requires further care.   Return instructions:   Please return if your fingers are numb or tingling, appear gray or blue, or you have severe pain (also elevate the arm and loosen splint or wrap if you were given one)  Please return to the Emergency Department if you experience worsening symptoms.   Please return if you have any other emergent concerns.  Additional Information:  Your vital signs today were: BP 123/66 mmHg   Pulse 60   Temp(Src) 97.8 F (36.6 C) (Oral)   Resp 10   SpO2 97% If your blood pressure (BP) was elevated above 135/85 this visit,  please have this repeated by your doctor within one month. --------------

## 2014-11-10 NOTE — ED Notes (Signed)
Pt arrives with L arm/shoulder pain for two days, states it started while weed eating in the yard. Cardiac hx, pacemaker in place. States he was unable to sleep d/t pain. States vicodin not effective- last dose this morning at 0400.

## 2014-11-10 NOTE — ED Notes (Signed)
PA at bedside.

## 2014-12-31 ENCOUNTER — Ambulatory Visit: Payer: Medicaid Other | Admitting: Podiatry

## 2015-01-24 ENCOUNTER — Ambulatory Visit (INDEPENDENT_AMBULATORY_CARE_PROVIDER_SITE_OTHER): Payer: Medicaid Other | Admitting: Podiatry

## 2015-01-24 ENCOUNTER — Encounter: Payer: Self-pay | Admitting: Podiatry

## 2015-01-24 VITALS — BP 117/78 | HR 72 | Resp 18

## 2015-01-24 DIAGNOSIS — L84 Corns and callosities: Secondary | ICD-10-CM | POA: Diagnosis not present

## 2015-01-24 DIAGNOSIS — M205X9 Other deformities of toe(s) (acquired), unspecified foot: Secondary | ICD-10-CM | POA: Diagnosis not present

## 2015-01-24 NOTE — Progress Notes (Signed)
Patient ID: HOLMES HAYS, male   DOB: Sep 29, 1950, 64 y.o.   MRN: [id number]  Subjective: 64 year old male presents the operative conservative recurring points to his left fourth and right fifth toes. He states the pads that were dispensed help quite a bit of the corns did recur and he does have pain over the area especially with pressure and shoegear. Denies any redness or drainage. No other complaints at this time in no acute changes otherwise.  Objective: AAO x3, NAD DP/PT pulses palpable bilaterally, CRT less than 3 seconds Protective sensation intact with Simms Weinstein monofilament Adductovarus of fourth and fifth digits bilaterally. There is a hyperkeratotic lesion along the left lateral fourth PIPJ in the medial right fifth DIPJ. Upon debridement there is no underlying ulceration, drainage or other signs of infection. No other areas of tenderness to bilateral lower extremities. MMT 5/5, ROM WNL.  No open lesions or other pre-ulcerative lesions.  No overlying edema, erythema, increase in warmth to bilateral lower extremities.  No pain with calf compression, swelling, warmth, erythema bilaterally.   Assessment: 64 year old male hyperkeratotic lesions due to underlying toe deformity   Plan: -Treatment options discussed including all alternatives, risks, and complications -Hyperkeratotic lesions were sharply debrided without complication/bleeding -Offloading pads dispensed -Discussed surgical intervention the future however given rheumatoid arthritis and this medication he was the operative medication. For now continue conservative treatment. -Follow-up if symptoms reoccur or sooner if any problems arise. In the meantime, encouraged to call the office with any questions, concerns, change in symptoms.   Ovid Curd, DPM

## 2015-03-18 ENCOUNTER — Encounter: Payer: Self-pay | Admitting: Cardiovascular Disease

## 2015-04-25 ENCOUNTER — Ambulatory Visit: Payer: Medicaid Other | Admitting: Podiatry

## 2015-04-26 ENCOUNTER — Encounter: Payer: Medicaid Other | Admitting: Cardiovascular Disease

## 2015-04-27 ENCOUNTER — Encounter: Payer: Medicaid Other | Admitting: Cardiovascular Disease

## 2015-05-30 ENCOUNTER — Ambulatory Visit: Payer: Medicaid Other | Admitting: Podiatry

## 2015-06-03 ENCOUNTER — Ambulatory Visit (INDEPENDENT_AMBULATORY_CARE_PROVIDER_SITE_OTHER): Payer: Medicaid Other | Admitting: Podiatry

## 2015-06-03 ENCOUNTER — Encounter: Payer: Self-pay | Admitting: Podiatry

## 2015-06-03 VITALS — BP 116/49 | HR 65 | Resp 18

## 2015-06-03 DIAGNOSIS — L84 Corns and callosities: Secondary | ICD-10-CM

## 2015-06-03 DIAGNOSIS — M205X9 Other deformities of toe(s) (acquired), unspecified foot: Secondary | ICD-10-CM

## 2015-06-03 NOTE — Progress Notes (Signed)
Patient ID: Roy Tucker, male   DOB: [birth year], 65 y.o.   MRN: [id number]  Subjective: 65 year old male presents the operative conservative recurring points to his left fourth and right fifth toes. He states the pads that were dispensed help quite a bit and the pain has subsided compared to previous appointments. No other complaints at this time in no acute changes otherwise.  Objective: AAO x3, NAD DP/PT pulses palpable bilaterally, CRT less than 3 seconds Protective sensation intact with Simms Weinstein monofilament Adductovarus of fourth and fifth digits bilaterally. There is a hyperkeratotic lesion along the left lateral fourth PIPJ in the lateral right fifth DIPJ. Upon debridement there is no underlying ulceration, drainage or other signs of infection. No other areas of tenderness to bilateral lower extremities. MMT 5/5, ROM WNL.  No open lesions or other pre-ulcerative lesions.  No overlying edema, erythema, increase in warmth to bilateral lower extremities.  No pain with calf compression, swelling, warmth, erythema bilaterally.   Assessment: 65 year old male hyperkeratotic lesions due to underlying toe deformity   Plan: -Treatment options discussed including all alternatives, risks, and complications -Hyperkeratotic lesions were sharply debrided without complication/bleeding -Offloading pads dispensed -Discussed surgical intervention the future however given rheumatoid arthritis and this medication he was the operative medication. For now continue conservative treatment. -Follow-up if symptoms reoccur or sooner if any problems arise. In the meantime, encouraged to call the office with any questions, concerns, change in symptoms.   Ovid Curd, DPM

## 2015-06-21 ENCOUNTER — Encounter: Payer: Self-pay | Admitting: Cardiovascular Disease

## 2015-06-21 ENCOUNTER — Ambulatory Visit (INDEPENDENT_AMBULATORY_CARE_PROVIDER_SITE_OTHER): Payer: Medicaid Other | Admitting: Cardiovascular Disease

## 2015-06-21 VITALS — BP 122/70 | HR 68 | Ht 70.0 in | Wt 208.4 lb

## 2015-06-21 DIAGNOSIS — Z950 Presence of cardiac pacemaker: Secondary | ICD-10-CM

## 2015-06-21 DIAGNOSIS — I471 Supraventricular tachycardia: Secondary | ICD-10-CM | POA: Diagnosis not present

## 2015-06-21 DIAGNOSIS — I495 Sick sinus syndrome: Secondary | ICD-10-CM

## 2015-06-21 NOTE — Patient Instructions (Addendum)
Dr. Croitoru recommends that you schedule a follow-up appointment in: 6 MONTHS WITH PACEMAKER CHECK (ST JUDE)   

## 2015-06-21 NOTE — Progress Notes (Signed)
Patient ID: Roy Tucker, male   DOB: 07-Sep-1950, 65 y.o.   MRN: [id number]    Cardiology Office Note    Date:  06/21/2015   ID:  Roy Tucker, DOB [birth year], MRN [id number]  PCP:  Egbert Garibaldi, NP  Cardiologist:   Thurmon Fair, MD   Chief Complaint  Patient presents with  . Follow-up     no chest pain, no shortness of breath, no edema, no pain or cramping in legs, no lightheadedness or dizziness    History of Present Illness:  Roy Tucker is a 65 y.o. male with a history of early-onset sinus node dysfunction and symptomatic bradycardia, returning for a pacemaker check. He also has a history of occasional atrial tachycardia for which he takes a very small dose of beta blocker. Since his last supplement he denies any problems with syncope, dizziness/lightheadedness, palpitations, lower extremity edema, angina or dyspnea with exertion. He does not have a history of atrial fibrillation or stroke/TIA.  His dual chamber St. Jude Accent pacemaker was implanted in 2011 and still has roughly 5-6 years of generator longevity. Atrial lead parameters are in normal range and stable. Ventricular lead impedance and sensing are good, but the pacing threshold is a little high, albeit stable. He has 98% atrial pacing and only 7% ventricular pacing. Only 6 episodes of very brief atrial mode switch been recorded, the longest being only 6 seconds in duration. They all look like atrial tachycardia. Activity level has been constant.  Noncardiac problems include rheumatoid arthritis on chronic treatment with Remicade.  Past Medical History  Diagnosis Date  . Hypertension   . Arthritis   . Gout   . Chronic pain   . Bradycardia   . Dysrhythmia     bradycardia  . Pacemaker   . Hyperthyroidism     graves on prednisone  . Pneumonia     few months ago  . GERD (gastroesophageal reflux disease)   . Sinus node dysfunction (HCC) 01/12/2010    St.Jude pacemaker  . Atrial tachycardia [facility])      Past Surgical History  Procedure Laterality Date  . Pacemaker insertion  01/12/2010    St.Jude Accent DR RF  . Colonoscopy w/ polypectomy    . Total knee arthroplasty  12/10/2011    Procedure: TOTAL KNEE ARTHROPLASTY;  Surgeon: Raymon Mutton, MD;  Location: [facility] OR;  Service: Orthopedics;  Laterality: Right;  . Nm myocar perf wall motion  07/05/2010    no significant ischemia    Outpatient Prescriptions Prior to Visit  Medication Sig Dispense Refill  . albuterol (PROVENTIL HFA;VENTOLIN HFA) 108 (90 BASE) MCG/ACT inhaler Inhale 2 puffs into the lungs every 6 (six) hours as needed for wheezing or shortness of breath. 1 Inhaler 0  . atenolol (TENORMIN) 25 MG tablet Take 0.5 tablets (12.5 mg total) by mouth daily. 15 tablet 11  . HYDROcodone-acetaminophen (NORCO) 10-325 MG per tablet Take 1 tablet by mouth every morning.    . inFLIXimab (REMICADE) 100 MG injection Inject 400 mg into the vein every 8 (eight) weeks.    Marland Kitchen levothyroxine (SYNTHROID, LEVOTHROID) 175 MCG tablet Take 175 mcg by mouth daily before breakfast.    . oxyCODONE-acetaminophen (PERCOCET/ROXICET) 5-325 MG per tablet Take 1-2 tablets by mouth every 6 (six) hours as needed for severe pain. (Patient not taking: Reported on 06/21/2015) 10 tablet 0   No facility-administered medications prior to visit.     Allergies:   Aspirin; Infliximab; Methimazole; and Neurontin   Social  History   Social History  . Marital Status: Married    Spouse Name: N/A  . Number of Children: N/A  . Years of Education: N/A   Social History Main Topics  . Smoking status: Former Smoker -- 1.00 packs/day for 15 years    Types: Cigarettes    Quit date: 12/03/2003  . Smokeless tobacco: None  . Alcohol Use: No  . Drug Use: No  . Sexual Activity: Not Currently   Other Topics Concern  . None   Social History Narrative     ROS:   Please see the history of present illness.    ROS All other systems reviewed and are negative.   PHYSICAL  EXAM:   VS:  BP 122/70 mmHg  Pulse 68  Ht  (1.778 m)  Wt 94.518 kg (208 lb 6 oz)  BMI 29.90 kg/m2   GEN: Well nourished, well developed, in no acute distress HEENT: normal Neck: no JVD, carotid bruits, or masses Cardiac: RRR; no murmurs, rubs, or gallops,no edema , healthy pacemaker site in the left subclavian area Respiratory:  clear to auscultation bilaterally, normal work of breathing GI: soft, nontender, nondistended, + BS MS: no deformity or atrophy Skin: warm and dry, no rash Neuro:  Alert and Oriented x 3, Strength and sensation are intact Psych: euthymic mood, full affect  Wt Readings from Last 3 Encounters:  06/21/15 94.518 kg (208 lb 6 oz)  09/07/14 93.486 kg (206 lb 1.6 oz)  11/24/13 92.897 kg (204 lb 12.8 oz)      Studies/Labs Reviewed:   EKG:  EKG is ordered today.  The ekg ordered today demonstrates atrioventricular sequential pacing  Recent Labs: 11/10/2014: BUN 11; Creatinine, Ser 0.80; Hemoglobin 13.5; Platelets 108*; Potassium 4.0; Sodium 136   Lipid Panel    Component Value Date/Time   CHOL * 01/09/2010 0330    225        ATP III CLASSIFICATION:  <200     mg/dL   Desirable  161-096  mg/dL   Borderline High  >=045    mg/dL   High          TRIG 92 01/09/2010 0330   HDL 42 01/09/2010 0330   CHOLHDL 5.4 01/09/2010 0330   VLDL 18 01/09/2010 0330   LDLCALC * 01/09/2010 0330    165        Total Cholesterol/HDL:CHD Risk Coronary Heart Disease Risk Table                     Men   Women  1/2 Average Risk   3.4   3.3  Average Risk       5.0   4.4  2 X Average Risk   9.6   7.1  3 X Average Risk  23.4   11.0        Use the calculated Patient Ratio above and the CHD Risk Table to determine the patient's CHD Risk.        ATP III CLASSIFICATION (LDL):  <100     mg/dL   Optimal  409-811  mg/dL   Near or Above                    Optimal  130-159  mg/dL   Borderline  914-782  mg/dL   High  >956     mg/dL   Very High     ASSESSMENT:    1.  Pacemaker   2. SSS (sick sinus syndrome) (  HCC)   3. Paroxysmal atrial tachycardia (HCC)      PLAN:  In order of problems listed above:  1. Normal pacemaker function. The ventricular lead pacing threshold is a little high, but this is acceptable since he very infrequently requires ventricular pacing. Downloads every 3 months and office visit yearly 2. Satisfactory treatment of chronotropic incompetence for his level of activity with current device sensor settings 3. Episodes of atrial tachycardia are very rare, very brief and asymptomatic. No change in treatment. I don't think a beta blocker is critical for his treatment, and if necessary can be stopped in response to side effects.    Medication Adjustments/Labs and Tests Ordered: Current medicines are reviewed at length with the patient today.  Concerns regarding medicines are outlined above.  Medication changes, Labs and Tests ordered today are listed in the Patient Instructions below. There are no Patient Instructions on file for this visit.     Joie Bimler, MD  06/21/2015 12:09 PM    [id number] [address] [place], [place], [place]  [address] Phone: [phone]; Fax: [phone]

## 2015-07-29 ENCOUNTER — Encounter (HOSPITAL_COMMUNITY): Payer: Self-pay | Admitting: Nurse Practitioner

## 2015-07-29 ENCOUNTER — Emergency Department (HOSPITAL_COMMUNITY)
Admission: EM | Admit: 2015-07-29 | Discharge: 2015-07-29 | Disposition: A | Discharge status: Home / Self Care | Payer: Medicaid Other | Attending: Emergency Medicine | Admitting: Emergency Medicine

## 2015-07-29 ENCOUNTER — Emergency Department (HOSPITAL_COMMUNITY): Payer: Medicaid Other

## 2015-07-29 DIAGNOSIS — Z950 Presence of cardiac pacemaker: Secondary | ICD-10-CM | POA: Diagnosis not present

## 2015-07-29 DIAGNOSIS — G8929 Other chronic pain: Secondary | ICD-10-CM | POA: Insufficient documentation

## 2015-07-29 DIAGNOSIS — K219 Gastro-esophageal reflux disease without esophagitis: Secondary | ICD-10-CM | POA: Diagnosis not present

## 2015-07-29 DIAGNOSIS — B349 Viral infection, unspecified: Secondary | ICD-10-CM | POA: Diagnosis not present

## 2015-07-29 DIAGNOSIS — M1990 Unspecified osteoarthritis, unspecified site: Secondary | ICD-10-CM | POA: Insufficient documentation

## 2015-07-29 DIAGNOSIS — Z87891 Personal history of nicotine dependence: Secondary | ICD-10-CM | POA: Insufficient documentation

## 2015-07-29 DIAGNOSIS — Z8701 Personal history of pneumonia (recurrent): Secondary | ICD-10-CM | POA: Insufficient documentation

## 2015-07-29 DIAGNOSIS — Z79899 Other long term (current) drug therapy: Secondary | ICD-10-CM | POA: Diagnosis not present

## 2015-07-29 DIAGNOSIS — R05 Cough: Secondary | ICD-10-CM | POA: Diagnosis present

## 2015-07-29 DIAGNOSIS — I10 Essential (primary) hypertension: Secondary | ICD-10-CM | POA: Diagnosis not present

## 2015-07-29 DIAGNOSIS — E0590 Thyrotoxicosis, unspecified without thyrotoxic crisis or storm: Secondary | ICD-10-CM | POA: Diagnosis not present

## 2015-07-29 LAB — CBC
HCT: 41 % (ref 39.0–52.0)
Hemoglobin: 14.1 g/dL (ref 13.0–17.0)
MCH: 29.4 pg (ref 26.0–34.0)
MCHC: 34.4 g/dL (ref 30.0–36.0)
MCV: 85.6 fL (ref 78.0–100.0)
Platelets: 104 10*3/uL — ABNORMAL LOW (ref 150–400)
RBC: 4.79 MIL/uL (ref 4.22–5.81)
RDW: 14 % (ref 11.5–15.5)
WBC: 6.1 10*3/uL (ref 4.0–10.5)

## 2015-07-29 LAB — COMPREHENSIVE METABOLIC PANEL
ALT: 22 U/L (ref 17–63)
AST: 24 U/L (ref 15–41)
Albumin: 4.1 g/dL (ref 3.5–5.0)
Alkaline Phosphatase: 82 U/L (ref 38–126)
Anion gap: 14 (ref 5–15)
BUN: 10 mg/dL (ref 6–20)
CO2: 26 mmol/L (ref 22–32)
Calcium: 9.2 mg/dL (ref 8.9–10.3)
Chloride: 104 mmol/L (ref 101–111)
Creatinine, Ser: 0.81 mg/dL (ref 0.61–1.24)
GFR calc Af Amer: >60 mL/min (ref >60)
GFR calc non Af Amer: >60 mL/min (ref >60)
Glucose, Bld: 97 mg/dL (ref 65–99)
Potassium: 3.9 mmol/L (ref 3.5–5.1)
Sodium: 144 mmol/L (ref 135–145)
Total Bilirubin: 1.2 mg/dL (ref 0.3–1.2)
Total Protein: 6.8 g/dL (ref 6.5–8.1)

## 2015-07-29 NOTE — Discharge Instructions (Signed)
Viral Infections °A viral infection can be caused by different types of viruses. Most viral infections are not serious and resolve on their own. However, some infections may cause severe symptoms and may lead to further complications. °SYMPTOMS °Viruses can frequently cause: °· Minor sore throat. °· Aches and pains. °· Headaches. °· Runny nose. °· Different types of rashes. °· Watery eyes. °· Tiredness. °· Cough. °· Loss of appetite. °· Gastrointestinal infections, resulting in nausea, vomiting, and diarrhea. °These symptoms do not respond to antibiotics because the infection is not caused by bacteria. However, you might catch a bacterial infection following the viral infection. This is sometimes called a "superinfection." Symptoms of such a bacterial infection may include: °· Worsening sore throat with pus and difficulty swallowing. °· Swollen neck glands. °· Chills and a high or persistent fever. °· Severe headache. °· Tenderness over the sinuses. °· Persistent overall ill feeling (malaise), muscle aches, and tiredness (fatigue). °· Persistent cough. °· Yellow, green, or brown mucus production with coughing. °HOME CARE INSTRUCTIONS  °· Only take over-the-counter or prescription medicines for pain, discomfort, diarrhea, or fever as directed by your caregiver. °· Drink enough water and fluids to keep your urine clear or pale yellow. Sports drinks can provide valuable electrolytes, sugars, and hydration. °· Get plenty of rest and maintain proper nutrition. Soups and broths with crackers or rice are fine. °SEEK IMMEDIATE MEDICAL CARE IF:  °· You have severe headaches, shortness of breath, chest pain, neck pain, or an unusual rash. °· You have uncontrolled vomiting, diarrhea, or you are unable to keep down fluids. °· You or your child has an oral temperature above 102° F (38.9° C), not controlled by medicine. °· Your baby is older than 3 months with a rectal temperature of 102° F (38.9° C) or higher. °· Your baby is 3  months old or younger with a rectal temperature of 100.4° F (38° C) or higher. °MAKE SURE YOU:  °· Understand these instructions. °· Will watch your condition. °· Will get help right away if you are not doing well or get worse. °  °This information is not intended to replace advice given to you by your health care provider. Make sure you discuss any questions you have with your health care provider. °  °Document Released: 02/07/2005 Document Revised: 07/23/2011 Document Reviewed: 10/06/2014 °Elsevier Interactive Patient Education ©2016 [employer] Inc[employer] ° °
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
## 2015-07-29 NOTE — ED Notes (Addendum)
He c/o 3 day history of runny nose, cough, body aches, nausea. His PCP placed him on tamiflu with no improvement of symptoms and they are concerned for pneumonia. He denies vomiting, diarrhea, fevers. A&Ox4, resp e/u

## 2015-07-29 NOTE — ED Provider Notes (Signed)
CSN: [id number]     Arrival date & time 07/29/15  1005 History  By signing my name below, I, Placido Sou, attest that this documentation has been prepared under the direction and in the presence of Cheron Schaumann, [place]. Electronically Signed: Placido Sou, ED Scribe. 07/29/2015. 2:11 PM.    No chief complaint on file.  The history is provided by the patient. No language interpreter was used.   HPI Comments: Roy Tucker is a 65 y.o. male who presents to the Emergency Department complaining of multiple symptoms including rhinorrhea, cough, body aches, intermittent HA and nausea onset 3 days ago. Pt was given Tamaflu 2 days ago by Dr. Wray Kearns without any significant relief. He additionally took Aleve with his last dosage this morning which he says provided short term relief of his HA. Pt reports a hx of smoking and quit ~15 years ago. He denies a pulmonary medical history.  He denies vomiting, diarrhea, fevers or any other associated symptoms at this time.    Past Medical History  Diagnosis Date  . Hypertension   . Arthritis   . Gout   . Chronic pain   . Bradycardia   . Dysrhythmia     bradycardia  . Pacemaker   . Hyperthyroidism     graves on prednisone  . Pneumonia     few months ago  . GERD (gastroesophageal reflux disease)   . Sinus node dysfunction (HCC) 01/12/2010    St.Jude pacemaker  . Atrial tachycardia [facility])    Past Surgical History  Procedure Laterality Date  . Pacemaker insertion  01/12/2010    St.Jude Accent DR RF  . Colonoscopy w/ polypectomy    . Total knee arthroplasty  12/10/2011    Procedure: TOTAL KNEE ARTHROPLASTY;  Surgeon: Raymon Mutton, MD;  Location: [facility];  Service: Orthopedics;  Laterality: Right;  . Nm myocar perf wall motion  07/05/2010    no significant ischemia   History reviewed. No pertinent family history. Social History  Substance Use Topics  . Smoking status: Former Smoker -- 1.00 packs/day for 15 years    Types: Cigarettes     Quit date: 12/03/2003  . Smokeless tobacco: None  . Alcohol Use: No    Review of Systems A complete 10 system review of systems was obtained and all systems are negative except as noted in the HPI and PMH.   Allergies  Aspirin; Infliximab; Methimazole; Neurontin; and Tylenol  Home Medications   Prior to Admission medications   Medication Sig Start Date End Date Taking? Authorizing Provider  albuterol (PROVENTIL HFA;VENTOLIN HFA) 108 (90 BASE) MCG/ACT inhaler Inhale 2 puffs into the lungs every 6 (six) hours as needed for wheezing or shortness of breath. 10/01/11   Quita Skye, MD  amLODipine (NORVASC) 2.5 MG tablet Take 1 tablet by mouth daily. Take 1 tab daily    Historical Provider, MD  atenolol (TENORMIN) 25 MG tablet Take 0.5 tablets (12.5 mg total) by mouth daily. 09/07/14   Mihai Croitoru, MD  esomeprazole (NEXIUM) 20 MG capsule Take 1 tablet by mouth daily. Take 1 tab daily    Historical Provider, MD  HYDROcodone-acetaminophen (NORCO) 10-325 MG per tablet Take 1 tablet by mouth every morning.    Historical Provider, MD  inFLIXimab (REMICADE) 100 MG injection Inject 400 mg into the vein every 8 (eight) weeks.    Historical Provider, MD  levothyroxine (SYNTHROID, LEVOTHROID) 175 MCG tablet Take 175 mcg by mouth daily before breakfast.    Historical Provider,  MD   BP 124/90 mmHg  Pulse 79  Temp(Src) 97.2 F (36.2 C) (Oral)  Resp 20  SpO2 98%    Physical Exam  Constitutional: He is oriented to person, place, and time. He appears well-developed and well-nourished.  HENT:  Head: Normocephalic and atraumatic.  Right Ear: External ear normal.  Left Ear: External ear normal.  Nose: Nose normal.  Mouth/Throat: Oropharynx is clear and moist. No oropharyngeal exudate.  Eyes: Conjunctivae and EOM are normal. Pupils are equal, round, and reactive to light.  Neck: Normal range of motion.  Cardiovascular: Normal rate, regular rhythm, normal heart sounds and intact distal pulses.  Exam  reveals no gallop and no friction rub.   No murmur heard. Pulmonary/Chest: Effort normal and breath sounds normal. No respiratory distress. He has no wheezes. He has no rales. He exhibits no tenderness.  Abdominal: Soft. Bowel sounds are normal. He exhibits no distension and no mass. There is no tenderness. There is no rebound and no guarding.  Musculoskeletal: Normal range of motion.  Lymphadenopathy:    He has no cervical adenopathy.  Neurological: He is alert and oriented to person, place, and time.  Skin: Skin is warm and dry.  Psychiatric: He has a normal mood and affect.  Nursing note and vitals reviewed.   ED Course  Procedures  DIAGNOSTIC STUDIES: Oxygen Saturation is 98% on RA, normal by my interpretation.    COORDINATION OF CARE: 2:06 PM Discussed next steps with pt. He verbalized understanding and is agreeable with the plan.   Labs Review Labs Reviewed  CBC - Abnormal; Notable for the following:    Platelets 104 (*)    All other components within normal limits  COMPREHENSIVE METABOLIC PANEL  URINALYSIS, ROUTINE W REFLEX MICROSCOPIC (NOT [facility])    Imaging Review Dg Chest 2 View  07/29/2015  CLINICAL DATA:  Three-day history of cough and nasal congestion. Nausea. EXAM: CHEST  2 VIEW COMPARISON:  November 10, 2014 FINDINGS: There is no edema or consolidation. The heart size and pulmonary vascularity are normal. Pacemaker leads are attached to the right atrium right ventricle. No adenopathy. There is degenerative change in the thoracic spine. IMPRESSION: No edema or consolidation.  No change in cardiac silhouette. Electronically Signed   By: Bretta BangWilliam  Woodruff III M.D.   On: 07/29/2015 12:16   I have personally reviewed and evaluated these images and lab results as part of my medical decision-making.   EKG Interpretation None      MDM Pt has normal chest xray.  Illness seems viral.   I suspect influenza.   I advised follow up with his provider for recheck on Monday.    Final diagnoses:  Viral illness   An After Visit Summary was printed and given to the patient.   Lonia SkinnerLeslie K ChaseSofia, PA-C 07/29/15 1555  Glynn OctaveStephen Rancour, MD 07/29/15 [phone]

## 2015-08-10 ENCOUNTER — Ambulatory Visit: Payer: Medicaid Other | Admitting: Allergy and Immunology

## 2015-08-18 DIAGNOSIS — M0579 Rheumatoid arthritis with rheumatoid factor of multiple sites without organ or systems involvement: Secondary | ICD-10-CM | POA: Insufficient documentation

## 2015-08-18 DIAGNOSIS — M059 Rheumatoid arthritis with rheumatoid factor, unspecified: Secondary | ICD-10-CM | POA: Insufficient documentation

## 2015-08-18 DIAGNOSIS — M069 Rheumatoid arthritis, unspecified: Secondary | ICD-10-CM | POA: Insufficient documentation

## 2015-09-12 ENCOUNTER — Other Ambulatory Visit: Payer: Self-pay | Admitting: Cardiovascular Disease

## 2015-09-12 NOTE — Telephone Encounter (Signed)
Rx request sent to pharmacy.  

## 2015-10-01 LAB — CUP PACEART INCLINIC DEVICE CHECK
Date Time Interrogation Session: 20170520102541
Implantable Lead Implant Date: 20110901
Implantable Lead Implant Date: 20110901
Implantable Lead Location: 753859
Implantable Lead Location: 753860
Lead Channel Setting Pacing Amplitude: 1.625
Lead Channel Setting Pacing Amplitude: 2 V
Lead Channel Setting Pacing Pulse Width: 0.8 ms
Lead Channel Setting Sensing Sensitivity: 2 mV
Pulse Gen Model: 2210
Pulse Gen Serial Number: 7168017

## 2015-12-27 ENCOUNTER — Encounter (INDEPENDENT_AMBULATORY_CARE_PROVIDER_SITE_OTHER): Payer: Self-pay

## 2015-12-27 ENCOUNTER — Ambulatory Visit (INDEPENDENT_AMBULATORY_CARE_PROVIDER_SITE_OTHER): Payer: Medicaid Other | Admitting: Cardiovascular Disease

## 2015-12-27 ENCOUNTER — Encounter: Payer: Self-pay | Admitting: Cardiovascular Disease

## 2015-12-27 VITALS — BP 112/68 | HR 60 | Ht 70.0 in | Wt 212.1 lb

## 2015-12-27 DIAGNOSIS — I471 Supraventricular tachycardia: Secondary | ICD-10-CM

## 2015-12-27 DIAGNOSIS — Z950 Presence of cardiac pacemaker: Secondary | ICD-10-CM | POA: Diagnosis not present

## 2015-12-27 DIAGNOSIS — I495 Sick sinus syndrome: Secondary | ICD-10-CM | POA: Diagnosis not present

## 2015-12-27 DIAGNOSIS — E669 Obesity, unspecified: Secondary | ICD-10-CM | POA: Insufficient documentation

## 2015-12-27 DIAGNOSIS — I10 Essential (primary) hypertension: Secondary | ICD-10-CM

## 2015-12-27 DIAGNOSIS — E6609 Other obesity due to excess calories: Secondary | ICD-10-CM | POA: Insufficient documentation

## 2015-12-27 LAB — CUP PACEART INCLINIC DEVICE CHECK
Battery Remaining Longevity: 54 mo
Battery Remaining Percentage: 57 %
Battery Voltage: 2.89 V
Brady Statistic RA Percent Paced: 98 %
Brady Statistic RV Percent Paced: 7.6 %
Date Time Interrogation Session: 20170815104329
Implantable Lead Implant Date: 20110901
Implantable Lead Implant Date: 20110901
Implantable Lead Location: 753859
Implantable Lead Location: 753860
Lead Channel Impedance Value: 340 Ohm
Lead Channel Impedance Value: 390 Ohm
Lead Channel Pacing Threshold Amplitude: 0.5 V
Lead Channel Pacing Threshold Amplitude: 2 V
Lead Channel Pacing Threshold Pulse Width: 0.4 ms
Lead Channel Pacing Threshold Pulse Width: 0.8 ms
Lead Channel Sensing Intrinsic Amplitude: 3 mV
Lead Channel Sensing Intrinsic Amplitude: 7.9 mV
Lead Channel Setting Pacing Amplitude: 1.5 V
Lead Channel Setting Pacing Amplitude: 2.25 V
Lead Channel Setting Pacing Pulse Width: 0.8 ms
Lead Channel Setting Sensing Sensitivity: 2 mV
Pulse Gen Model: 2210
Pulse Gen Serial Number: 7168017

## 2015-12-27 NOTE — Progress Notes (Signed)
Cardiology Office Note    Date:  12/27/2015   ID:  Roy FlesherDonald R Scoggins, DOB 09/09/1950, MRN [id number]  PCP:  Egbert GaribaldiMillsaps, KIMBERLY M, NP  Cardiologist:   Thurmon FairMihai Deziya Amero, MD   Chief Complaint  Patient presents with  . Follow-up    pacer check, occassional shortness of breath, occassional edema in legs and feet, occassional pain in legs    History of Present Illness:  Roy Tucker is a 65 y.o. male with symptomatic sinus node dysfunction returning for follow-up on his dual-chamber permanent pacemaker ([employer]. [employer] Accent implanted 2011). Additional medical problems include systemic hypertension, hyperlipidemia and treated hypothyroidism, rheumatoid arthritis.  He has continued to gain weight since his last appointment and is now frankly obese. He has occasional mild pedal edema that resolves by morning. He has a variety of arthralgia complaints and occasionally feels short of breath with greater than usual physical activity. This has not changed. He denies angina, palpitations, syncope, focal neurological deficits and true intermittent claudication  Interrogation of his device shows normal function. He has roughly 4-5 years of estimated pacemaker longevity. He has 98% atrial pacing and only 7.6% ventricular pacing. Atrial lead thresholds and other parameters are excellent. Ventricular pacing threshold is slightly high at 2.0 V at 0.8 ms. Ventricular sensing is unchanged at 7.9 mV and impedance is stable at 340 ohm. A handful of episodes of atrial mode switch have been recorded, all of them less than a minute in duration. No episodes of high ventricular rates are seen.  Reports taking atorvastatin and an unknown dose, although this is not on his list of medications today. He also had labs with his primary care provider Mayra NeerKim Millsaps. I don't have a copy of those today..     Past Medical History:  Diagnosis Date  . Arthritis   . Atrial tachycardia (HCC)   . Bradycardia   . Chronic pain   .  Dysrhythmia    bradycardia  . GERD (gastroesophageal reflux disease)   . Gout   . Hypertension   . Hyperthyroidism    graves on prednisone  . Pacemaker   . Pneumonia    few months ago  . Sinus node dysfunction (HCC) 01/12/2010   St.Jude pacemaker    Past Surgical History:  Procedure Laterality Date  . COLONOSCOPY W/ POLYPECTOMY    . NM MYOCAR PERF WALL MOTION  07/05/2010   no significant ischemia  . PACEMAKER INSERTION  01/12/2010   St.Jude Accent DR RF  . TOTAL KNEE ARTHROPLASTY  12/10/2011   Procedure: TOTAL KNEE ARTHROPLASTY;  Surgeon: Raymon MuttonStephen D Lucey, MD;  Location: [facility] OR;  Service: Orthopedics;  Laterality: Right;    Current Medications: Outpatient Medications Prior to Visit  Medication Sig Dispense Refill  . albuterol (PROVENTIL HFA;VENTOLIN HFA) 108 (90 BASE) MCG/ACT inhaler Inhale 2 puffs into the lungs every 6 (six) hours as needed for wheezing or shortness of breath. 1 Inhaler 0  . amLODipine (NORVASC) 2.5 MG tablet Take 1 tablet by mouth daily. Take 1 tab daily    . atenolol (TENORMIN) 25 MG tablet TAKE 1/2 TABLET BY MOUTH DAILY 15 tablet 4  . HYDROcodone-acetaminophen (NORCO) 10-325 MG per tablet Take 1 tablet by mouth every morning.    Marland Kitchen. levothyroxine (SYNTHROID, LEVOTHROID) 175 MCG tablet Take 175 mcg by mouth daily before breakfast.    . esomeprazole (NEXIUM) 20 MG capsule Take 1 tablet by mouth daily. Take 1 tab daily    . inFLIXimab (REMICADE) 100 MG injection Inject 400  mg into the vein every 8 (eight) weeks.     No facility-administered medications prior to visit.      Allergies:   Aspirin; Infliximab; Methimazole [methimazole]; Neurontin [gabapentin]; and Tylenol [acetaminophen]   Social History   Social History  . Marital status: Married    Spouse name: N/A  . Number of children: N/A  . Years of education: N/A   Social History Main Topics  . Smoking status: Former Smoker    Packs/day: 1.00    Years: 15.00    Types: Cigarettes    Quit date:  12/03/2003  . Smokeless tobacco: None  . Alcohol use No  . Drug use: No  . Sexual activity: Not Currently   Other Topics Concern  . None   Social History Narrative  . None       ROS:   Please see the history of present illness.    ROS All other systems reviewed and are negative.   PHYSICAL EXAM:   VS:  BP 112/68 (BP Location: Right Arm, Patient Position: Sitting, Cuff Size: Normal)   Pulse 60   Ht 5\' 10"  (1.778 m)   Wt 212 lb 2 oz (96.2 kg)   BMI 30.44 kg/m    GEN: Well nourished, well developed, in no acute distress  HEENT: normal  Neck: no JVD, carotid bruits, or masses Cardiac: RRR; no murmurs, rubs, or gallops,no edema , Healthy left subclavian pacemaker site  Respiratory:  clear to auscultation bilaterally, normal work of breathing GI: soft, nontender, nondistended, + BS MS: no deformity or atrophy  Skin: warm and dry, no rash Neuro:  Alert and Oriented x 3, Strength and sensation are intact Psych: euthymic mood, full affect  Wt Readings from Last 3 Encounters:  12/27/15 212 lb 2 oz (96.2 kg)  06/21/15 208 lb 6 oz (94.5 kg)  09/07/14 206 lb 1.6 oz (93.5 kg)      Studies/Labs Reviewed:   EKG:  EKG is ordered today.  The ekg ordered today demonstrates atrial paced, ventricular sensed rhythm, otherwise normal ECG   Recent Labs: 07/29/2015: ALT 22; BUN 10; Creatinine, Ser 0.81; Hemoglobin 14.1; Platelets 104; Potassium 3.9; Sodium 144   Lipid Panel    Component Value Date/Time   CHOL (H) 01/09/2010 0330    225        ATP III CLASSIFICATION:  <200     mg/dL   Desirable  409-811  mg/dL   Borderline High  >=914    mg/dL   High          TRIG 92 01/09/2010 0330   HDL 42 01/09/2010 0330   CHOLHDL 5.4 01/09/2010 0330   VLDL 18 01/09/2010 0330   LDLCALC (H) 01/09/2010 0330    165        Total Cholesterol/HDL:CHD Risk Coronary Heart Disease Risk Table                     Men   Women  1/2 Average Risk   3.4   3.3  Average Risk       5.0   4.4  2 X Average  Risk   9.6   7.1  3 X Average Risk  23.4   11.0        Use the calculated Patient Ratio above and the CHD Risk Table to determine the patient's CHD Risk.        ATP III CLASSIFICATION (LDL):  <100     mg/dL   Optimal  100-129  mg/dL   Near or Above                    Optimal  130-159  mg/dL   Borderline  [id number]  mg/dL   High  >518>190     mg/dL   Very High     ASSESSMENT:    1. Paroxysmal atrial tachycardia (HCC)   2. SSS (sick sinus syndrome) (HCC)      PLAN:  In order of problems listed above:  1. SSS: Almost 100% atrial pacing, satisfactory heart rate histogram distribution especially since he is rather sedentary 2. PPM: Minimal device function. Ventricular pacing lead thresholds are slightly elevated, but this is not currently of any impact since he has very little ventricular pacing 3. PAT: Infrequent and asymptomatic. No specific additional therapy needed (continue current dose of atenolol). 4. HTN: Well controlled 5. Obesity: He has steadily gained weight over the years and is now frankly obese. He has prominent visceral adiposity and is a risk of diabetes mellitus, coronary and other vascular complications. Weight loss is discussed in detail with him.    Medication Adjustments/Labs and Tests Ordered: Current medicines are reviewed at length with the patient today.  Concerns regarding medicines are outlined above.  Medication changes, Labs and Tests ordered today are listed in the Patient Instructions below. Patient Instructions  Dr Royann Shiversroitoru recommends that you continue on your current medications as directed. Please refer to the Current Medication list given to you today.  Remote monitoring is used to monitor your Pacemaker of ICD from home. This monitoring reduces the number of office visits required to check your device to one time per year. It allows us to keep an eye on the functioning of your device to ensure it is working properly. You are scheduled for a device  check from home on Tuesday, November 14th, 2017. You may send your transmission at any time that day. If you have a wireless device, the transmission will be sent automatically. After your physician reviews your transmission, you will receive a postcard with your next transmission date.  Dr Royann Shiversroitoru recommends that you schedule a follow-up appointment in 12 months with a pacemaker check. You will receive a reminder letter in the mail two months in advance. If you don't receive a letter, please call our office to schedule the follow-up appointment.  If you need a refill on your cardiac medications before your next appointment, please call your pharmacy.    Signed, Thurmon FairMihai Jerrica Thorman, MD  12/27/2015 6:01 PM    [facility] [address], [place], [place]  [address] Phone: [phone]; Fax: [phone]

## 2015-12-27 NOTE — Patient Instructions (Signed)
Dr Croitoru recommends that you continue on your current medications as directed. Please refer to the Current Medication list given to you today.  Remote monitoring is used to monitor your Pacemaker of ICD from home. This monitoring reduces the number of office visits required to check your device to one time per year. It allows us to keep an eye on the functioning of your device to ensure it is working properly. You are scheduled for a device check from home on Tuesday, November 14th, 2017. You may send your transmission at any time that day. If you have a wireless device, the transmission will be sent automatically. After your physician reviews your transmission, you will receive a postcard with your next transmission date.  Dr Croitoru recommends that you schedule a follow-up appointment in 12 months with a pacemaker check. You will receive a reminder letter in the mail two months in advance. If you don't receive a letter, please call our office to schedule the follow-up appointment.  If you need a refill on your cardiac medications before your next appointment, please call your pharmacy. 

## 2015-12-30 ENCOUNTER — Encounter: Payer: Self-pay | Admitting: Cardiovascular Disease

## 2016-01-10 ENCOUNTER — Telehealth: Payer: Self-pay

## 2016-01-10 DIAGNOSIS — E0590 Thyrotoxicosis, unspecified without thyrotoxic crisis or storm: Secondary | ICD-10-CM

## 2016-01-10 DIAGNOSIS — Z13220 Encounter for screening for lipoid disorders: Secondary | ICD-10-CM

## 2016-01-10 DIAGNOSIS — I471 Supraventricular tachycardia: Secondary | ICD-10-CM

## 2016-01-10 DIAGNOSIS — Z79899 Other long term (current) drug therapy: Secondary | ICD-10-CM

## 2016-01-10 NOTE — Telephone Encounter (Signed)
-----   Message from Thurmon FairMihai Croitoru, MD sent at 01/07/2016  8:45 AM EDT ----- Regarding: RE: labs Please order CMET, lipid, TSH MCr ----- Message ----- From: Marzella SchleinAngela M Gearl Kimbrough, CMA Sent: 01/06/2016   5:00 PM To: Thurmon FairMihai Croitoru, MD Subject: labs                                           Called for labs. Last labs done 12/2014.

## 2016-01-10 NOTE — Telephone Encounter (Signed)
[email or web address]

## 2016-01-10 NOTE — Telephone Encounter (Signed)
Wife returned call. Recommendations reviewed. Wife verbalized understanding.  Labs ordered, printed, and mailed to patient.

## 2016-01-31 LAB — COMPREHENSIVE METABOLIC PANEL
ALT: 15 U/L (ref 9–46)
AST: 18 U/L (ref 10–35)
Albumin: 4.2 g/dL (ref 3.6–5.1)
Alkaline Phosphatase: 89 U/L (ref 40–115)
BUN: 9 mg/dL (ref 7–25)
CO2: 28 mmol/L (ref 20–31)
Calcium: 9.3 mg/dL (ref 8.6–10.3)
Chloride: 103 mmol/L (ref 98–110)
Creat: 0.81 mg/dL (ref 0.70–1.25)
Glucose, Bld: 86 mg/dL (ref 65–99)
Potassium: 4.2 mmol/L (ref 3.5–5.3)
Sodium: 138 mmol/L (ref 135–146)
Total Bilirubin: 1.5 mg/dL — ABNORMAL HIGH (ref 0.2–1.2)
Total Protein: 6.9 g/dL (ref 6.1–8.1)

## 2016-01-31 LAB — LIPID PANEL
Cholesterol: 156 mg/dL (ref 125–200)
HDL: 47 mg/dL (ref >=40)
LDL Cholesterol: 87 mg/dL (ref <130)
Total CHOL/HDL Ratio: 3.3 Ratio (ref <=5.0)
Triglycerides: 112 mg/dL (ref <150)
VLDL: 22 mg/dL (ref <30)

## 2016-01-31 LAB — TSH: TSH: 1.1 mIU/L (ref 0.40–4.50)

## 2016-02-04 ENCOUNTER — Other Ambulatory Visit: Payer: Self-pay | Admitting: Cardiovascular Disease

## 2016-02-07 NOTE — Telephone Encounter (Signed)
Rx(s) sent to pharmacy electronically.  

## 2016-02-15 ENCOUNTER — Encounter (HOSPITAL_COMMUNITY): Payer: Self-pay | Admitting: Emergency Medicine

## 2016-02-15 ENCOUNTER — Emergency Department (HOSPITAL_COMMUNITY): Payer: Medicaid Other

## 2016-02-15 DIAGNOSIS — Z5321 Procedure and treatment not carried out due to patient leaving prior to being seen by health care provider: Secondary | ICD-10-CM | POA: Diagnosis not present

## 2016-02-15 DIAGNOSIS — Z950 Presence of cardiac pacemaker: Secondary | ICD-10-CM | POA: Diagnosis not present

## 2016-02-15 DIAGNOSIS — R079 Chest pain, unspecified: Secondary | ICD-10-CM | POA: Diagnosis not present

## 2016-02-15 DIAGNOSIS — I10 Essential (primary) hypertension: Secondary | ICD-10-CM | POA: Insufficient documentation

## 2016-02-15 DIAGNOSIS — I2510 Atherosclerotic heart disease of native coronary artery without angina pectoris: Secondary | ICD-10-CM | POA: Insufficient documentation

## 2016-02-15 DIAGNOSIS — Z87891 Personal history of nicotine dependence: Secondary | ICD-10-CM | POA: Diagnosis not present

## 2016-02-15 DIAGNOSIS — Z96651 Presence of right artificial knee joint: Secondary | ICD-10-CM | POA: Diagnosis not present

## 2016-02-15 LAB — CBC
HCT: 42.2 % (ref 39.0–52.0)
Hemoglobin: 14.1 g/dL (ref 13.0–17.0)
MCH: 28.7 pg (ref 26.0–34.0)
MCHC: 33.4 g/dL (ref 30.0–36.0)
MCV: 85.9 fL (ref 78.0–100.0)
Platelets: 132 10*3/uL — ABNORMAL LOW (ref 150–400)
RBC: 4.91 MIL/uL (ref 4.22–5.81)
RDW: 13.7 % (ref 11.5–15.5)
WBC: 5.3 10*3/uL (ref 4.0–10.5)

## 2016-02-15 LAB — BASIC METABOLIC PANEL
Anion gap: 10 (ref 5–15)
BUN: 10 mg/dL (ref 6–20)
CO2: 31 mmol/L (ref 22–32)
Calcium: 10.7 mg/dL — ABNORMAL HIGH (ref 8.9–10.3)
Chloride: 99 mmol/L — ABNORMAL LOW (ref 101–111)
Creatinine, Ser: 1.09 mg/dL (ref 0.61–1.24)
GFR calc Af Amer: >60 mL/min (ref >60)
GFR calc non Af Amer: >60 mL/min (ref >60)
Glucose, Bld: 101 mg/dL — ABNORMAL HIGH (ref 65–99)
Potassium: 3.6 mmol/L (ref 3.5–5.1)
Sodium: 140 mmol/L (ref 135–145)

## 2016-02-15 LAB — I-STAT TROPONIN, ED: Troponin i, poc: 0 ng/mL (ref 0.00–0.08)

## 2016-02-15 NOTE — ED Triage Notes (Signed)
Patient with chest pain that started about one hour ago.  Patient denies any nausea or vomiting.  Patient does have some shortness of breath with the pain.  He states that he did have some stomach upset with the pain.

## 2016-02-16 ENCOUNTER — Emergency Department (HOSPITAL_COMMUNITY)
Admission: EM | Admit: 2016-02-16 | Discharge: 2016-02-16 | Disposition: A | Discharge status: Home / Self Care | Payer: Medicaid Other | Attending: Emergency Medicine | Admitting: Emergency Medicine

## 2016-02-16 NOTE — ED Notes (Signed)
Patient was seen walking out exit of ED.

## 2016-02-25 ENCOUNTER — Emergency Department (HOSPITAL_COMMUNITY): Payer: Medicaid Other

## 2016-02-25 ENCOUNTER — Encounter (HOSPITAL_COMMUNITY): Payer: Self-pay | Admitting: Emergency Medicine

## 2016-02-25 ENCOUNTER — Emergency Department (HOSPITAL_COMMUNITY)
Admission: EM | Admit: 2016-02-25 | Discharge: 2016-02-25 | Disposition: A | Discharge status: Home / Self Care | Payer: Medicaid Other | Attending: Emergency Medicine | Admitting: Emergency Medicine

## 2016-02-25 DIAGNOSIS — M7918 Myalgia, other site: Secondary | ICD-10-CM

## 2016-02-25 DIAGNOSIS — I10 Essential (primary) hypertension: Secondary | ICD-10-CM | POA: Diagnosis not present

## 2016-02-25 DIAGNOSIS — E039 Hypothyroidism, unspecified: Secondary | ICD-10-CM | POA: Diagnosis not present

## 2016-02-25 DIAGNOSIS — M542 Cervicalgia: Secondary | ICD-10-CM | POA: Diagnosis not present

## 2016-02-25 DIAGNOSIS — R1012 Left upper quadrant pain: Secondary | ICD-10-CM | POA: Diagnosis not present

## 2016-02-25 DIAGNOSIS — R51 Headache: Secondary | ICD-10-CM | POA: Insufficient documentation

## 2016-02-25 DIAGNOSIS — Y92410 Unspecified street and highway as the place of occurrence of the external cause: Secondary | ICD-10-CM | POA: Diagnosis not present

## 2016-02-25 DIAGNOSIS — Z96651 Presence of right artificial knee joint: Secondary | ICD-10-CM | POA: Diagnosis not present

## 2016-02-25 DIAGNOSIS — Z950 Presence of cardiac pacemaker: Secondary | ICD-10-CM | POA: Diagnosis not present

## 2016-02-25 DIAGNOSIS — Y999 Unspecified external cause status: Secondary | ICD-10-CM | POA: Diagnosis not present

## 2016-02-25 DIAGNOSIS — I2510 Atherosclerotic heart disease of native coronary artery without angina pectoris: Secondary | ICD-10-CM | POA: Insufficient documentation

## 2016-02-25 DIAGNOSIS — Z87891 Personal history of nicotine dependence: Secondary | ICD-10-CM | POA: Diagnosis not present

## 2016-02-25 DIAGNOSIS — Y939 Activity, unspecified: Secondary | ICD-10-CM | POA: Insufficient documentation

## 2016-02-25 DIAGNOSIS — R0789 Other chest pain: Secondary | ICD-10-CM | POA: Insufficient documentation

## 2016-02-25 LAB — I-STAT CHEM 8, ED
BUN: 10 mg/dL (ref 6–20)
Calcium, Ion: 1.17 mmol/L (ref 1.15–1.40)
Chloride: 101 mmol/L (ref 101–111)
Creatinine, Ser: 0.8 mg/dL (ref 0.61–1.24)
Glucose, Bld: 97 mg/dL (ref 65–99)
HCT: 38 % — ABNORMAL LOW (ref 39.0–52.0)
Hemoglobin: 12.9 g/dL — ABNORMAL LOW (ref 13.0–17.0)
Potassium: 4.2 mmol/L (ref 3.5–5.1)
Sodium: 139 mmol/L (ref 135–145)
TCO2: 27 mmol/L (ref 0–100)

## 2016-02-25 MED ORDER — IOPAMIDOL (ISOVUE-300) INJECTION 61%
INTRAVENOUS | Status: AC
  Administered 2016-02-25: 100 mL
  Filled 2016-02-25: qty 100

## 2016-02-25 MED ORDER — HYDROMORPHONE HCL 1 MG/ML IJ SOLN
1.0000 mg | Freq: Once | INTRAMUSCULAR | Status: AC
  Administered 2016-02-25: 1 mg via INTRAVENOUS
  Filled 2016-02-25: qty 1

## 2016-02-25 NOTE — ED Notes (Signed)
Stepped away for a few mins will return
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
## 2016-02-25 NOTE — ED Notes (Signed)
Patient transported to X-ray 

## 2016-03-27 ENCOUNTER — Encounter: Payer: Medicaid Other | Admitting: *Deleted

## 2016-03-28 ENCOUNTER — Telehealth: Payer: Self-pay | Admitting: Cardiology

## 2016-03-28 NOTE — Telephone Encounter (Signed)
LMOVM reminding pt to send remote transmission.   

## 2016-03-30 ENCOUNTER — Encounter: Payer: Self-pay | Admitting: Cardiology

## 2016-04-25 ENCOUNTER — Encounter: Payer: Self-pay | Admitting: Physician Assistant

## 2016-04-25 ENCOUNTER — Ambulatory Visit (INDEPENDENT_AMBULATORY_CARE_PROVIDER_SITE_OTHER): Payer: Medicaid Other | Admitting: Physician Assistant

## 2016-04-25 ENCOUNTER — Telehealth: Payer: Self-pay | Admitting: Cardiovascular Disease

## 2016-04-25 VITALS — BP 122/84 | HR 60 | Ht 70.0 in | Wt 209.0 lb

## 2016-04-25 DIAGNOSIS — R0609 Other forms of dyspnea: Secondary | ICD-10-CM

## 2016-04-25 DIAGNOSIS — R1010 Upper abdominal pain, unspecified: Secondary | ICD-10-CM | POA: Diagnosis not present

## 2016-04-25 DIAGNOSIS — R079 Chest pain, unspecified: Secondary | ICD-10-CM | POA: Diagnosis not present

## 2016-04-25 MED ORDER — ESOMEPRAZOLE MAGNESIUM 40 MG PO CPDR: 40.0000 mg | DELAYED_RELEASE_CAPSULE | Freq: Two times a day (BID) | ORAL | 1 refills | Status: DC

## 2016-04-25 NOTE — Telephone Encounter (Signed)
Patient hurting on left side in the front under ribs and along the back. Numbness running down left arm.  Has been on going past three days, but has increased a lot since this morning.  Has not taken any Nitro.

## 2016-04-25 NOTE — Patient Instructions (Addendum)
Medication Instructions:  INCREASE NEXIUM TO TWICE DAILY 2 FILLS-TALK TO PCP ABOUT FUTURE REFILLS    If you need a refill on your cardiac medications before your next appointment, please call your pharmacy.  Labwork: NONE   Testing/Procedures: Your physician has requested that you have a lexiscan myoview. For further information please visit [email or web address]. Please follow instruction sheet, as given.   Follow-Up: Your [facility]ann Shivers[facility]U You will receive a reminder letter in the mail two months in advance. If you don't receive a letter, please call our office to schedule the follow-up appointment.   Any Other Special Instructions Will Be Listed Below (If Applicable). CALL DR MILLSAPS FOR APPT TO DISCUSS MUSCULOSKELETAL PAIN EVULATION     HAPPY HOLIDAYS!   Thank you for choosing [facility]!!

## 2016-04-25 NOTE — Telephone Encounter (Signed)
Patient called complaining of pain under his ribs on the left side, and numbness running down his left arm for the past 3 days. He states that the pain under his left rib has increased this morning. Patient denies any chest pain, nausea, vomiting, sob, sweating, jaw pain, lightheadedness, or dizziness. Patient is requesting an appointment with Dr. Royann Shiversroitoru this afternoon after 12 pm since he has to take his wife to the doctor this morning. I called the [facility] office and got an appointment scheduled with Theodore Demarkhonda Barrett, PA-C for 2 pm [facility] since Dr. Royann Shiversroitoru is not in the office this afternoon. I advised the patient that if he developed any chest pain or if his symptoms worsened before his appointment he should be seen in the ER. Patient verbalized understanding.
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
## 2016-05-02 ENCOUNTER — Telehealth (HOSPITAL_COMMUNITY): Payer: Self-pay

## 2016-05-02 NOTE — Telephone Encounter (Signed)
Encounter complete. 

## 2016-05-04 ENCOUNTER — Ambulatory Visit (HOSPITAL_COMMUNITY)
Admission: RE | Admit: 2016-05-04 | Discharge: 2016-05-04 | Disposition: A | Discharge status: Home / Self Care | Payer: Medicaid Other | Source: Ambulatory Visit | Attending: Cardiology | Admitting: Cardiology

## 2016-05-04 DIAGNOSIS — E669 Obesity, unspecified: Secondary | ICD-10-CM | POA: Insufficient documentation

## 2016-05-04 DIAGNOSIS — R079 Chest pain, unspecified: Secondary | ICD-10-CM | POA: Insufficient documentation

## 2016-05-04 DIAGNOSIS — I10 Essential (primary) hypertension: Secondary | ICD-10-CM | POA: Insufficient documentation

## 2016-05-04 LAB — MYOCARDIAL PERFUSION IMAGING
LV dias vol: 72 mL (ref 62–150)
LV sys vol: 31 mL
Peak HR: 65 {beats}/min
Rest HR: 60 {beats}/min
SDS: 2
SRS: 3
SSS: 5
TID: 1.41

## 2016-05-04 MED ORDER — REGADENOSON 0.4 MG/5ML IV SOLN
0.4000 mg | Freq: Once | INTRAVENOUS | Status: AC
  Administered 2016-05-04: 0.4 mg via INTRAVENOUS

## 2016-05-04 MED ORDER — TECHNETIUM TC 99M TETROFOSMIN IV KIT
30.8000 | PACK | Freq: Once | INTRAVENOUS | Status: AC | PRN
  Administered 2016-05-04: 30.8 via INTRAVENOUS
  Filled 2016-05-04: qty 31

## 2016-05-04 MED ORDER — TECHNETIUM TC 99M TETROFOSMIN IV KIT
10.1000 | PACK | Freq: Once | INTRAVENOUS | Status: AC | PRN
  Administered 2016-05-04: 10.1 via INTRAVENOUS
  Filled 2016-05-04: qty 11

## 2016-07-11 ENCOUNTER — Encounter: Payer: Self-pay | Admitting: Cardiovascular Disease

## 2016-07-11 ENCOUNTER — Ambulatory Visit (INDEPENDENT_AMBULATORY_CARE_PROVIDER_SITE_OTHER): Payer: Medicaid Other | Admitting: Cardiovascular Disease

## 2016-07-11 VITALS — BP 112/64 | HR 64 | Ht 70.0 in | Wt 216.0 lb

## 2016-07-11 DIAGNOSIS — I471 Supraventricular tachycardia: Secondary | ICD-10-CM

## 2016-07-11 DIAGNOSIS — I495 Sick sinus syndrome: Secondary | ICD-10-CM

## 2016-07-11 DIAGNOSIS — E7800 Pure hypercholesterolemia, unspecified: Secondary | ICD-10-CM | POA: Diagnosis not present

## 2016-07-11 DIAGNOSIS — Z950 Presence of cardiac pacemaker: Secondary | ICD-10-CM

## 2016-07-11 DIAGNOSIS — E669 Obesity, unspecified: Secondary | ICD-10-CM | POA: Diagnosis not present

## 2016-07-11 DIAGNOSIS — I10 Essential (primary) hypertension: Secondary | ICD-10-CM | POA: Diagnosis not present

## 2016-07-11 DIAGNOSIS — I4719 Other supraventricular tachycardia: Secondary | ICD-10-CM

## 2016-07-11 LAB — CUP PACEART INCLINIC DEVICE CHECK
Battery Voltage: 2.89 V
Brady Statistic RA Percent Paced: 99 %
Brady Statistic RV Percent Paced: 7.6 %
Date Time Interrogation Session: 20180228161112
Implantable Lead Implant Date: 20110901
Implantable Lead Implant Date: 20110901
Implantable Lead Location: 753859
Implantable Lead Location: 753860
Implantable Pulse Generator Implant Date: 20110901
Lead Channel Impedance Value: 375 Ohm
Lead Channel Impedance Value: 437.5 Ohm
Lead Channel Pacing Threshold Amplitude: 0.75 V
Lead Channel Pacing Threshold Amplitude: 0.75 V
Lead Channel Pacing Threshold Amplitude: 2.5 V
Lead Channel Pacing Threshold Amplitude: 2.5 V
Lead Channel Pacing Threshold Pulse Width: 0.4 ms
Lead Channel Pacing Threshold Pulse Width: 0.4 ms
Lead Channel Pacing Threshold Pulse Width: 0.8 ms
Lead Channel Pacing Threshold Pulse Width: 0.8 ms
Lead Channel Sensing Intrinsic Amplitude: 2 mV
Lead Channel Sensing Intrinsic Amplitude: 8.1 mV
Lead Channel Setting Pacing Amplitude: 1.625
Lead Channel Setting Pacing Amplitude: 2.75 V
Lead Channel Setting Pacing Pulse Width: 0.8 ms
Lead Channel Setting Sensing Sensitivity: 2 mV
Pulse Gen Model: 2210
Pulse Gen Serial Number: 7168017

## 2016-07-11 NOTE — Progress Notes (Signed)
Cardiology Office Note    Date:  07/12/2016   ID:  Roy Tucker, DOB 1950-08-05, MRN [id number]  PCP:  Egbert Garibaldi, NP  Cardiologist:   Thurmon Fair, MD   Chief Complaint  Patient presents with  . Follow-up    History of Present Illness:  Roy Tucker is a 66 y.o. male with symptomatic sinus node dysfunction returning for follow-up on his dual-chamber permanent pacemaker (St. Jude Accent implanted 2011). Additional medical problems include systemic hypertension, hyperlipidemia and treated hypothyroidism, rheumatoid arthritis.  He managed to lose some weight but gained it all back and is still obese. He has occasional mild pedal edema that resolves by morning. He has a variety of arthralgia complaints and occasionally feels short of breath with greater than usual physical activity. This has not changed. He denies angina, palpitations, syncope, focal neurological deficits and true intermittent claudication  Interrogation of his device shows normal function. He has roughly 3.3-5.3 years of estimated pacemaker longevity. He has 99% atrial pacing and only 7.7% ventricular pacing. Atrial lead thresholds and other parameters are excellent. Ventricular pacing threshold is slightly high at 2.0 V at 0.8 ms (unchanged). Ventricular sensing is unchanged at 7.9 mV and impedance is stable at 340 ohm. Device has not recorded ventricular high rates or atrial fibrillation..     Past Medical History:  Diagnosis Date  . Arthritis   . Atrial tachycardia (HCC)   . Bradycardia   . Chronic pain   . Dysrhythmia    bradycardia  . GERD (gastroesophageal reflux disease)   . Gout   . Hypertension   . Hyperthyroidism    graves on prednisone  . Pacemaker   . Pneumonia    few months ago  . Sinus node dysfunction (HCC) 01/12/2010   St.Jude pacemaker    Past Surgical History:  Procedure Laterality Date  . COLONOSCOPY W/ POLYPECTOMY    . NM MYOCAR PERF WALL MOTION  07/05/2010   no  significant ischemia  . PACEMAKER INSERTION  01/12/2010   St.Jude Accent DR RF  . TOTAL KNEE ARTHROPLASTY  12/10/2011   Procedure: TOTAL KNEE ARTHROPLASTY;  Surgeon: Roy Mutton, MD;  Location: [facility];  Service: Orthopedics;  Laterality: Right;    Current Medications: Outpatient Medications Prior to Visit  Medication Sig Dispense Refill  . albuterol (PROVENTIL HFA;VENTOLIN HFA) 108 (90 BASE) MCG/ACT inhaler Inhale 2 puffs into the lungs every 6 (six) hours as needed for wheezing or shortness of breath. 1 Inhaler 0  . amLODipine (NORVASC) 2.5 MG tablet Take 1 tablet by mouth daily. Take 1 tab daily    . atenolol (TENORMIN) 25 MG tablet TAKE 1/2 TABLET BY MOUTH ONCE DAILY 15 tablet 6  . esomeprazole (NEXIUM) 40 MG capsule Take 1 capsule (40 mg total) by mouth 2 (two) times daily before a meal. 60 capsule 1  . HYDROcodone-acetaminophen (NORCO) 10-325 MG per tablet Take 1 tablet by mouth every morning.    Marland Kitchen levothyroxine (SYNTHROID, LEVOTHROID) 175 MCG tablet Take 175 mcg by mouth daily before breakfast.     No facility-administered medications prior to visit.      Allergies:   Infliximab; Methimazole [methimazole]; Neurontin [gabapentin]; Aspirin; and Tylenol [acetaminophen]   Social History   Social History  . Marital status: Married    Spouse name: N/A  . Number of children: N/A  . Years of education: N/A   Social History Main Topics  . Smoking status: Former Smoker    Packs/day: 1.00  Years: 15.00    Types: Cigarettes    Quit date: 12/03/2003  . Smokeless tobacco: Never Used  . Alcohol use No  . Drug use: No  . Sexual activity: Not Currently   Other Topics Concern  . None   Social History Narrative  . None       ROS:   Please see the history of present illness.    ROS All other systems reviewed and are negative.   PHYSICAL EXAM:   VS:  BP 112/64 (BP Location: Right Arm, Patient Position: Sitting, Cuff Size: Normal)   Pulse 64   Ht 5\' 10"  (1.778 m)   Wt 98  kg (216 lb)   SpO2 99%   BMI 30.99 kg/m    GEN: Well nourished, well developed, in no acute distress  HEENT: normal  Neck: no JVD, carotid bruits, or masses Cardiac: RRR; no murmurs, rubs, or gallops,no edema , Healthy left subclavian pacemaker site  Respiratory:  clear to auscultation bilaterally, normal work of breathing GI: soft, nontender, nondistended, + BS MS: no deformity or atrophy  Skin: warm and dry, no rash Neuro:  Alert and Oriented x 3, Strength and sensation are intact Psych: euthymic mood, full affect  Wt Readings from Last 3 Encounters:  07/11/16 98 kg (216 lb)  04/25/16 94.8 kg (209 lb)  02/25/16 98.4 kg (217 lb)      Studies/Labs Reviewed:   EKG:  EKG is ordered today.  The ekg ordered today demonstrates atrial paced, ventricular sensed rhythm, otherwise normal ECG   Recent Labs: 01/30/2016: ALT 15; TSH 1.10 02/15/2016: Platelets 132 02/25/2016: BUN 10; Creatinine, Ser 0.80; Hemoglobin 12.9; Potassium 4.2; Sodium 139   Lipid Panel    Component Value Date/Time   CHOL 156 01/30/2016 1004   TRIG 112 01/30/2016 1004   HDL 47 01/30/2016 1004   CHOLHDL 3.3 01/30/2016 1004   VLDL 22 01/30/2016 1004   LDLCALC 87 01/30/2016 1004     ASSESSMENT:    1. SSS (sick sinus syndrome) (HCC)   2. Pacemaker   3. Paroxysmal atrial tachycardia (HCC)   4. Essential hypertension   5. Hypercholesterolemia   6. Obesity, mild      PLAN:  In order of problems listed above:  1. SSS: Almost 100% atrial pacing, satisfactory heart rate histogram distribution especially since he is rather sedentary 2. PPM: Normal device function. Ventricular pacing lead thresholds are slightly elevated, but this is not currently of any impact since he has very little ventricular pacing 3. PAT: Infrequent and asymptomatic. No specific additional therapy needed (continue current dose of atenolol). 4. HTN: Well controlled. 5. HLP: He again reports taking a statin, probably atorvastatin, but  does not know the strength. All lipid parameters are within target range 6. Obesity: He has steadily gained weight over the years and is now frankly obese. He has prominent visceral adiposity and is a risk of diabetes mellitus, coronary and other vascular complications. Weight loss is discussed in detail with him.    Medication Adjustments/Labs and Tests Ordered: Current medicines are reviewed at length with the patient today.  Concerns regarding medicines are outlined above.  Medication changes, Labs and Tests ordered today are listed in the Patient Instructions below. Patient Instructions  Dr Royann Shivers recommends that you continue on your current medications as directed. Please refer to the Current Medication list given to you today.  Remote monitoring is used to monitor your Pacemaker of ICD from home. This monitoring reduces the number of office visits  required to check your device to one time per year. It allows us to keep an eye on the functioning of your device to ensure it is working properly. You are scheduled for a device check from home on Wednesday, May 30th, 2018. You may send your transmission at any time that day. If you have a wireless device, the transmission will be sent automatically. After your physician reviews your transmission, you will receive a postcard with your next transmission date.  Dr Royann Shiversroitoru recommends that you schedule a follow-up appointment in 12 months with a pacemaker check. You will receive a reminder letter in the mail two months in advance. If you don't receive a letter, please call our office to schedule the follow-up appointment.  If you need a refill on your cardiac medications before your next appointment, please call your pharmacy.   Your physician encouraged you to lose weight for better health.    Signed, Thurmon FairMihai Elzabeth Mcquerry, MD  07/12/2016 2:27 PM    [facility] [address], [place], [place]  [address] Phone: [phone](336) [phone];  Fax: [phone](336) [phone]

## 2016-07-11 NOTE — Patient Instructions (Signed)
Dr Royann Shiversroitoru recommends that you continue on your current medications as directed. Please refer to the Current Medication list given to you today.  Remote monitoring is used to monitor your Pacemaker of ICD from home. This monitoring reduces the number of office visits required to check your device to one time per year. It allows us to keep an eye on the functioning of your device to ensure it is working properly. You are scheduled for a device check from home on Wednesday, May 30th, 2018. You may send your transmission at any time that day. If you have a wireless device, the transmission will be sent automatically. After your physician reviews your transmission, you will receive a postcard with your next transmission date.  Dr Royann Shiversroitoru recommends that you schedule a follow-up appointment in 12 months with a pacemaker check. You will receive a reminder letter in the mail two months in advance. If you don't receive a letter, please call our office to schedule the follow-up appointment.  If you need a refill on your cardiac medications before your next appointment, please call your pharmacy.   Your physician encouraged you to lose weight for better health.

## 2016-07-12 DIAGNOSIS — E7800 Pure hypercholesterolemia, unspecified: Secondary | ICD-10-CM | POA: Insufficient documentation

## 2016-07-31 ENCOUNTER — Encounter: Payer: Medicaid Other | Admitting: Cardiovascular Disease

## 2016-08-29 ENCOUNTER — Other Ambulatory Visit: Payer: Self-pay | Admitting: Cardiovascular Disease

## 2016-08-29 NOTE — Telephone Encounter (Signed)
Rx(s) sent to pharmacy electronically.  

## 2016-10-10 ENCOUNTER — Encounter: Payer: Medicaid Other | Admitting: *Deleted

## 2016-10-17 ENCOUNTER — Ambulatory Visit (INDEPENDENT_AMBULATORY_CARE_PROVIDER_SITE_OTHER): Payer: Self-pay | Admitting: *Deleted

## 2016-10-17 DIAGNOSIS — I495 Sick sinus syndrome: Secondary | ICD-10-CM

## 2016-10-19 NOTE — Progress Notes (Signed)
Patient presents to the office for a Merlin monitor pairing. Pairing was attempted in several rooms around the office, but d/t the lack of good cellular service the pairing was unsuccessful. Patient was instructed to call Merlin tech svcs once he got home for help with the pairing. I instructed patient to leave his monitor plugged up within 357ft of his bed once the pairing is successful. Patient verbalized understanding of instructions. Plan to follow up as scheduled.

## 2016-11-06 ENCOUNTER — Ambulatory Visit (INDEPENDENT_AMBULATORY_CARE_PROVIDER_SITE_OTHER): Payer: Medicaid Other | Admitting: *Deleted

## 2016-11-06 DIAGNOSIS — I495 Sick sinus syndrome: Secondary | ICD-10-CM | POA: Diagnosis not present

## 2016-11-06 NOTE — Progress Notes (Signed)
Remote pacemaker transmission.   

## 2016-11-07 ENCOUNTER — Encounter: Payer: Self-pay | Admitting: Cardiology

## 2016-11-08 LAB — CUP PACEART REMOTE DEVICE CHECK
Battery Remaining Longevity: 43 mo
Battery Remaining Percentage: 51 %
Battery Voltage: 2.87 V
Brady Statistic AP VP Percent: 10 %
Brady Statistic AP VS Percent: 90 %
Brady Statistic AS VP Percent: 1 %
Brady Statistic AS VS Percent: 1 %
Brady Statistic RA Percent Paced: 99 %
Brady Statistic RV Percent Paced: 10 %
Date Time Interrogation Session: 20180626080913
Implantable Lead Implant Date: 20110901
Implantable Lead Implant Date: 20110901
Implantable Lead Location: 753859
Implantable Lead Location: 753860
Implantable Pulse Generator Implant Date: 20110901
Lead Channel Impedance Value: 380 Ohm
Lead Channel Impedance Value: 440 Ohm
Lead Channel Pacing Threshold Amplitude: 0.625 V
Lead Channel Pacing Threshold Amplitude: 3 V
Lead Channel Pacing Threshold Pulse Width: 0.4 ms
Lead Channel Pacing Threshold Pulse Width: 0.8 ms
Lead Channel Sensing Intrinsic Amplitude: 2.5 mV
Lead Channel Sensing Intrinsic Amplitude: 7.3 mV
Lead Channel Setting Pacing Amplitude: 1.625
Lead Channel Setting Pacing Amplitude: 3.25 V
Lead Channel Setting Pacing Pulse Width: 0.8 ms
Lead Channel Setting Sensing Sensitivity: 2 mV
Pulse Gen Model: 2210
Pulse Gen Serial Number: 7168017

## 2017-02-05 ENCOUNTER — Ambulatory Visit (INDEPENDENT_AMBULATORY_CARE_PROVIDER_SITE_OTHER): Payer: Medicaid Other | Admitting: *Deleted

## 2017-02-05 DIAGNOSIS — I495 Sick sinus syndrome: Secondary | ICD-10-CM | POA: Diagnosis not present

## 2017-02-05 NOTE — Progress Notes (Signed)
Remote pacemaker transmission.   

## 2017-02-07 ENCOUNTER — Encounter: Payer: Self-pay | Admitting: Cardiology

## 2017-02-08 LAB — CUP PACEART REMOTE DEVICE CHECK
Battery Remaining Longevity: 40 mo
Battery Remaining Percentage: 46 %
Battery Voltage: 2.86 V
Brady Statistic AP VP Percent: 10 %
Brady Statistic AP VS Percent: 89 %
Brady Statistic AS VP Percent: 1 %
Brady Statistic AS VS Percent: 1 %
Brady Statistic RA Percent Paced: 99 %
Brady Statistic RV Percent Paced: 10 %
Date Time Interrogation Session: 20180925084557
Implantable Lead Implant Date: 20110901
Implantable Lead Implant Date: 20110901
Implantable Lead Location: 753859
Implantable Lead Location: 753860
Implantable Pulse Generator Implant Date: 20110901
Lead Channel Impedance Value: 380 Ohm
Lead Channel Impedance Value: 440 Ohm
Lead Channel Pacing Threshold Amplitude: 0.625 V
Lead Channel Pacing Threshold Amplitude: 2.875 V
Lead Channel Pacing Threshold Pulse Width: 0.4 ms
Lead Channel Pacing Threshold Pulse Width: 0.8 ms
Lead Channel Sensing Intrinsic Amplitude: 2.1 mV
Lead Channel Sensing Intrinsic Amplitude: 7.5 mV
Lead Channel Setting Pacing Amplitude: 1.625
Lead Channel Setting Pacing Amplitude: 3.125
Lead Channel Setting Pacing Pulse Width: 0.8 ms
Lead Channel Setting Sensing Sensitivity: 2 mV
Pulse Gen Model: 2210
Pulse Gen Serial Number: 7168017

## 2017-05-09 ENCOUNTER — Ambulatory Visit (INDEPENDENT_AMBULATORY_CARE_PROVIDER_SITE_OTHER): Payer: Medicaid Other | Admitting: *Deleted

## 2017-05-09 DIAGNOSIS — I495 Sick sinus syndrome: Secondary | ICD-10-CM

## 2017-05-09 NOTE — Progress Notes (Signed)
Remote pacemaker transmission.   

## 2017-05-10 ENCOUNTER — Encounter: Payer: Self-pay | Admitting: Cardiology

## 2017-05-27 LAB — CUP PACEART REMOTE DEVICE CHECK
Battery Remaining Longevity: 35 mo
Battery Remaining Percentage: 40 %
Battery Voltage: 2.84 V
Brady Statistic AP VP Percent: 11 %
Brady Statistic AP VS Percent: 89 %
Brady Statistic AS VP Percent: 1 %
Brady Statistic AS VS Percent: 1 %
Brady Statistic RA Percent Paced: 99 %
Brady Statistic RV Percent Paced: 11 %
Date Time Interrogation Session: 20181227111741
Implantable Lead Implant Date: 20110901
Implantable Lead Implant Date: 20110901
Implantable Lead Location: 753859
Implantable Lead Location: 753860
Implantable Pulse Generator Implant Date: 20110901
Lead Channel Impedance Value: 360 Ohm
Lead Channel Impedance Value: 440 Ohm
Lead Channel Pacing Threshold Amplitude: 0.625 V
Lead Channel Pacing Threshold Amplitude: 2.75 V
Lead Channel Pacing Threshold Pulse Width: 0.4 ms
Lead Channel Pacing Threshold Pulse Width: 0.8 ms
Lead Channel Sensing Intrinsic Amplitude: 2.5 mV
Lead Channel Sensing Intrinsic Amplitude: 8.4 mV
Lead Channel Setting Pacing Amplitude: 1.625
Lead Channel Setting Pacing Amplitude: 3 V
Lead Channel Setting Pacing Pulse Width: 0.8 ms
Lead Channel Setting Sensing Sensitivity: 2 mV
Pulse Gen Model: 2210
Pulse Gen Serial Number: 7168017

## 2017-06-19 ENCOUNTER — Other Ambulatory Visit: Payer: Self-pay | Admitting: Cardiovascular Disease

## 2017-06-19 NOTE — Telephone Encounter (Signed)
Follow  Up     *STAT* If patient is at the pharmacy, call can be transferred to refill team.   1. Which medications need to be refilled? (please list name of each medication and dose if known) atenolol (TENORMIN) 25 MG tablet  2. Which pharmacy/location (including street and city if local pharmacy) is medication to be sent to? [facility]   3. Do they need a 30 day or 90 day supply?30

## 2017-06-20 ENCOUNTER — Other Ambulatory Visit: Payer: Self-pay | Admitting: *Deleted

## 2017-06-20 ENCOUNTER — Other Ambulatory Visit: Payer: Self-pay | Admitting: Cardiovascular Disease

## 2017-06-20 MED ORDER — ATENOLOL 25 MG PO TABS: 12.5000 mg | ORAL_TABLET | Freq: Every day | ORAL | 0 refills | Status: DC

## 2017-06-20 NOTE — Telephone Encounter (Signed)
REFILL 

## 2017-08-08 ENCOUNTER — Ambulatory Visit (INDEPENDENT_AMBULATORY_CARE_PROVIDER_SITE_OTHER): Payer: Medicaid Other | Admitting: *Deleted

## 2017-08-08 DIAGNOSIS — I495 Sick sinus syndrome: Secondary | ICD-10-CM

## 2017-08-09 NOTE — Progress Notes (Signed)
Remote pacemaker transmission.   

## 2017-08-13 ENCOUNTER — Encounter: Payer: Self-pay | Admitting: Cardiology

## 2017-08-26 ENCOUNTER — Encounter: Payer: Self-pay | Admitting: *Deleted

## 2017-08-27 LAB — CUP PACEART REMOTE DEVICE CHECK
Battery Remaining Longevity: 31 mo
Battery Remaining Percentage: 35 %
Battery Voltage: 2.83 V
Brady Statistic AP VP Percent: 11 %
Brady Statistic AP VS Percent: 89 %
Brady Statistic AS VP Percent: 1 %
Brady Statistic AS VS Percent: 1 %
Brady Statistic RA Percent Paced: 99 %
Brady Statistic RV Percent Paced: 11 %
Date Time Interrogation Session: 20190328174911
Implantable Lead Implant Date: 20110901
Implantable Lead Implant Date: 20110901
Implantable Lead Location: 753859
Implantable Lead Location: 753860
Implantable Pulse Generator Implant Date: 20110901
Lead Channel Impedance Value: 380 Ohm
Lead Channel Impedance Value: 450 Ohm
Lead Channel Pacing Threshold Amplitude: 0.75 V
Lead Channel Pacing Threshold Amplitude: 2.5 V
Lead Channel Pacing Threshold Pulse Width: 0.4 ms
Lead Channel Pacing Threshold Pulse Width: 0.8 ms
Lead Channel Sensing Intrinsic Amplitude: 3.1 mV
Lead Channel Sensing Intrinsic Amplitude: 7.6 mV
Lead Channel Setting Pacing Amplitude: 1.75 V
Lead Channel Setting Pacing Amplitude: 2.75 V
Lead Channel Setting Pacing Pulse Width: 0.8 ms
Lead Channel Setting Sensing Sensitivity: 2 mV
Pulse Gen Model: 2210
Pulse Gen Serial Number: 7168017

## 2017-09-11 ENCOUNTER — Encounter: Payer: Medicaid Other | Admitting: Cardiovascular Disease

## 2017-10-10 ENCOUNTER — Ambulatory Visit (INDEPENDENT_AMBULATORY_CARE_PROVIDER_SITE_OTHER): Payer: Medicaid Other | Admitting: Cardiovascular Disease

## 2017-10-10 ENCOUNTER — Encounter: Payer: Self-pay | Admitting: Cardiovascular Disease

## 2017-10-10 VITALS — BP 115/76 | HR 64 | Ht 70.0 in | Wt 216.8 lb

## 2017-10-10 DIAGNOSIS — I495 Sick sinus syndrome: Secondary | ICD-10-CM

## 2017-10-10 DIAGNOSIS — I471 Supraventricular tachycardia: Secondary | ICD-10-CM | POA: Diagnosis not present

## 2017-10-10 DIAGNOSIS — Z950 Presence of cardiac pacemaker: Secondary | ICD-10-CM | POA: Diagnosis not present

## 2017-10-10 DIAGNOSIS — I10 Essential (primary) hypertension: Secondary | ICD-10-CM | POA: Diagnosis not present

## 2017-10-10 DIAGNOSIS — E7800 Pure hypercholesterolemia, unspecified: Secondary | ICD-10-CM

## 2017-10-10 DIAGNOSIS — E669 Obesity, unspecified: Secondary | ICD-10-CM

## 2017-10-10 MED ORDER — ATENOLOL 25 MG PO TABS: 25.0000 mg | ORAL_TABLET | Freq: Every day | ORAL | 0 refills | Status: DC

## 2017-10-10 NOTE — Progress Notes (Signed)
Cardiology Office Note    Date:  10/12/2017   ID:  Roy Tucker, DOB 1950/06/12, MRN [id number]  PCP:  Marva Panda, NP  Cardiologist:   Thurmon Fair, MD   Chief Complaint  Patient presents with  . Pacemaker Check    History of Present Illness:  Roy Tucker is a 67 y.o. male with symptomatic sinus node dysfunction returning for follow-up on his dual-chamber permanent pacemaker (St. Jude Accent implanted 2011). Additional medical problems include systemic hypertension, hyperlipidemia and treated hypothyroidism, rheumatoid arthritis.  He is done well over the last year or so.  He has not had any issues with cardiac complaints.  He denies syncope, palpitations, dizziness, leg edema, orthopnea, PND or angina pectoris.  He can do most activities without dyspnea.  Overall appears to have functional class II status.  He denies intermittent claudication or any focal neurological complaints.  Interrogation of his device shows normal function. He has roughly 1.7-2.6 years of estimated pacemaker longevity. He has >99% atrial pacing and only 11% ventricular pacing. Atrial lead thresholds and other parameters are excellent. Ventricular pacing threshold is slightly high at 2.375 V at 0.8 ms (minimally changed since last year). Ventricular sensing and lead impedance remain normal. Device has not recorded ventricular high rates or atrial fibrillation.  His last episode of paroxysmal atrial tachycardia was in July 2018.  There are several inconsistencies on our medicine list today, when compared with the list from his primary care provider.  I think we cleared all that up today.  He is taking atorvastatin 80 mg daily he is not taking amlodipine and the dose of atenolol is 25 mg 1 whole tablet daily.  Past Medical History:  Diagnosis Date  . Arthritis   . Atrial tachycardia (HCC)   . Bradycardia   . Chronic pain   . Dysrhythmia    bradycardia  . GERD (gastroesophageal reflux disease)    . Gout   . Hypertension   . Hyperthyroidism    graves on prednisone  . Pacemaker   . Pneumonia    few months ago  . Sinus node dysfunction (HCC) 01/12/2010   St.Jude pacemaker    Past Surgical History:  Procedure Laterality Date  . COLONOSCOPY W/ POLYPECTOMY    . NM MYOCAR PERF WALL MOTION  07/05/2010   no significant ischemia  . PACEMAKER INSERTION  01/12/2010   St.Jude Accent DR RF  . TOTAL KNEE ARTHROPLASTY  12/10/2011   Procedure: TOTAL KNEE ARTHROPLASTY;  Surgeon: Raymon Mutton, MD;  Location: [facility] OR;  Service: Orthopedics;  Laterality: Right;    Current Medications: Outpatient Medications Prior to Visit  Medication Sig Dispense Refill  . albuterol (PROVENTIL HFA;VENTOLIN HFA) 108 (90 BASE) MCG/ACT inhaler Inhale 2 puffs into the lungs every 6 (six) hours as needed for wheezing or shortness of breath. 1 Inhaler 0  . ALPRAZolam (XANAX) 0.5 MG tablet Take 0.5 mg by mouth at bedtime as needed for anxiety.    Marland Kitchen atorvastatin (LIPITOR) 20 MG tablet Take 1 tablet by mouth every evening.    Marland Kitchen esomeprazole (NEXIUM) 40 MG capsule Take 1 capsule (40 mg total) by mouth 2 (two) times daily before a meal. 60 capsule 1  . HYDROcodone-acetaminophen (NORCO) 10-325 MG per tablet Take 1 tablet by mouth every morning.    Marland Kitchen levothyroxine (SYNTHROID, LEVOTHROID) 175 MCG tablet Take 175 mcg by mouth daily before breakfast.    . loratadine (CLARITIN) 10 MG tablet Take 10 mg by mouth daily.  5  .  meloxicam (MOBIC) 15 MG tablet Take 1 tablet by mouth every morning.    Marland Kitchen atenolol (TENORMIN) 25 MG tablet Take 0.5 tablets (12.5 mg total) by mouth daily. NEED OV. 15 tablet 0  . amLODipine (NORVASC) 2.5 MG tablet Take 1 tablet by mouth daily. Take 1 tab daily     No facility-administered medications prior to visit.      Allergies:   Infliximab; Methimazole; Neurontin [gabapentin]; Aspirin; and Tylenol [acetaminophen]   Social History   Socioeconomic History  . Marital status: Married    Spouse name:  Not on file  . Number of children: Not on file  . Years of education: Not on file  . Highest education level: Not on file  Occupational History  . Not on file  Social Needs  . Financial resource strain: Not on file  . Food insecurity:    Worry: Not on file    Inability: Not on file  . Transportation needs:    Medical: Not on file    Non-medical: Not on file  Tobacco Use  . Smoking status: Former Smoker    Packs/day: 1.00    Years: 15.00    Pack years: 15.00    Types: Cigarettes    Last attempt to quit: 12/03/2003    Years since quitting: 13.8  . Smokeless tobacco: Never Used  Substance and Sexual Activity  . Alcohol use: No  . Drug use: No  . Sexual activity: Not Currently  Lifestyle  . Physical activity:    Days per week: Not on file    Minutes per session: Not on file  . Stress: Not on file  Relationships  . Social connections:    Talks on phone: Not on file    Gets together: Not on file    Attends religious service: Not on file    Active member of club or organization: Not on file    Attends meetings of clubs or organizations: Not on file    Relationship status: Not on file  Other Topics Concern  . Not on file  Social History Narrative  . Not on file       ROS:   Please see the history of present illness.    ROS All other systems reviewed and are negative.   PHYSICAL EXAM:   VS:  BP 115/76   Pulse 64   Ht  (1.778 m)   Wt 216 lb 12.8 oz (98.3 kg)   BMI 31.11 kg/m     General: Alert, oriented x3, no distress, obese, healthy left subclavian pacemaker site Head: no evidence of trauma, PERRL, EOMI, no exophtalmos or lid lag, no myxedema, no xanthelasma; normal ears, nose and oropharynx Neck: normal jugular venous pulsations and no hepatojugular reflux; brisk carotid pulses without delay and no carotid bruits Chest: clear to auscultation, no signs of consolidation by percussion or palpation, normal fremitus, symmetrical and full respiratory  excursions Cardiovascular: normal position and quality of the apical impulse, regular rhythm, normal first and second heart sounds, no murmurs, rubs or gallops Abdomen: no tenderness or distention, no masses by palpation, no abnormal pulsatility or arterial bruits, normal bowel sounds, no hepatosplenomegaly Extremities: no clubbing, cyanosis or edema; 2+ radial, ulnar and brachial pulses bilaterally; 2+ right femoral, posterior tibial and dorsalis pedis pulses; 2+ left femoral, posterior tibial and dorsalis pedis pulses; no subclavian or femoral bruits Neurological: grossly nonfocal Psych: Normal mood and affect   Wt Readings from Last 3 Encounters:  10/10/17 216 lb 12.8 oz (  98.3 kg)  07/11/16 216 lb (98 kg)  04/25/16 209 lb (94.8 kg)      Studies/Labs Reviewed:   EKG:  EKG is ordered today.  The ekg ordered today demonstrates atrial paced, ventricular paced rhythm.  Recent Labs: No results found for requested labs within last 8760 hours.   Lipid Panel    Component Value Date/Time   CHOL 156 01/30/2016 1004   TRIG 112 01/30/2016 1004   HDL 47 01/30/2016 1004   CHOLHDL 3.3 01/30/2016 1004   VLDL 22 01/30/2016 1004   LDLCALC 87 01/30/2016 1004     ASSESSMENT:    1. SSS (sick sinus syndrome) (HCC)   2. Pacemaker   3. Paroxysmal atrial tachycardia (HCC)   4. Essential hypertension   5. Hypercholesterolemia   6. Mild obesity      PLAN:  In order of problems listed above:  1. SSS: He has 100% atrial paced rhythm.  The heart rate histogram distribution is slightly blunted, but appropriate for his sedentary lifestyle 2. PPM: Stable device function.  Ventricular pacing thresholds remain high, but this does not seriously impact device performance sensed ventricular pacing is rarely required.  May need to consider placement of a new ventricular lead at the time of his generator change in about 2 years. 3. PAT: Virtually none over the last year.  Asymptomatic. 4. HTN: Well  controlled. 5. HLP: On statin, lipids monitored by PCP. 6. Obesity: His weight remains a big problem.  Discussed ways to avoid additional weight loss and hopefully promote some weight gain with changes in diet and exercise.   Medication Adjustments/Labs and Tests Ordered: Current medicines are reviewed at length with the patient today.  Concerns regarding medicines are outlined above.  Medication changes, Labs and Tests ordered today are listed in the Patient Instructions below. Patient Instructions  Dr Royann Shivers recommends that you continue on your current medications as directed. Please refer to the Current Medication list given to you today.  Remote monitoring is used to monitor your Pacemaker or ICD from home. This monitoring reduces the number of office visits required to check your device to one time per year. It allows [place] to keep an eye on the functioning of your device to ensure it is working properly. You are scheduled for a device check from home on Thursday, June 27th, 2019. You may send your transmission at any time that day. If you have a wireless device, the transmission will be sent automatically. After your physician reviews your transmission, you will receive a notification with your next transmission date.  To improve our patient care and to more adequately follow your device, [facility] has decided, as a practice, to start following each patient four times a year with your home monitor. This means that you may experience a remote appointment that is close to an in-office appointment with your physician. Your insurance will apply at the same rate as other remote monitoring transmissions.  Dr Royann Shivers recommends that you schedule a follow-up appointment in 12 months with a pacemaker check. You will receive a reminder letter in the mail two months in advance. If you don't receive a letter, please call our office to schedule the follow-up appointment.  If you need a refill on your  cardiac medications before your next appointment, please call your pharmacy.    Signed, Thurmon Fair, MD  10/12/2017 12:27 PM    [facility] [address] [place], [place], [place]  [address] Phone: [phone]; Fax: [phone]   

## 2017-10-10 NOTE — Patient Instructions (Signed)
Dr Royann Shivers recommends that you continue on your current medications as directed. Please refer to the Current Medication list given to you today.  Remote monitoring is used to monitor your Pacemaker or ICD from home. This monitoring reduces the number of office visits required to check your device to one time per year. It allows [place] to keep an eye on the functioning of your device to ensure it is working properly. You are scheduled for a device check from home on Thursday, June 27th, 2019. You may send your transmission at any time that day. If you have a wireless device, the transmission will be sent automatically. After your physician reviews your transmission, you will receive a notification with your next transmission date.  To improve our patient care and to more adequately follow your device, [facility] has decided, as a practice, to start following each patient four times a year with your home monitor. This means that you may experience a remote appointment that is close to an in-office appointment with your physician. Your insurance will apply at the same rate as other remote monitoring transmissions.  Dr Royann Shivers recommends that you schedule a follow-up appointment in 12 months with a pacemaker check. You will receive a reminder letter in the mail two months in advance. If you don't receive a letter, please call our office to schedule the follow-up appointment.  If you need a refill on your cardiac medications before your next appointment, please call your pharmacy.

## 2017-10-12 ENCOUNTER — Encounter: Payer: Self-pay | Admitting: Cardiovascular Disease

## 2017-11-07 ENCOUNTER — Ambulatory Visit (INDEPENDENT_AMBULATORY_CARE_PROVIDER_SITE_OTHER): Payer: Medicaid Other | Admitting: *Deleted

## 2017-11-07 ENCOUNTER — Telehealth: Payer: Self-pay

## 2017-11-07 DIAGNOSIS — I495 Sick sinus syndrome: Secondary | ICD-10-CM

## 2017-11-07 NOTE — Telephone Encounter (Signed)
Confirmed remote transmission w/ pt wife.   

## 2017-11-08 NOTE — Progress Notes (Signed)
Remote pacemaker transmission.   

## 2017-11-15 LAB — CUP PACEART INCLINIC DEVICE CHECK
Date Time Interrogation Session: 20190705160913
Implantable Lead Implant Date: 20110901
Implantable Lead Implant Date: 20110901
Implantable Lead Location: 753859
Implantable Lead Location: 753860
Implantable Pulse Generator Implant Date: 20110901
Lead Channel Setting Pacing Amplitude: 1.75 V
Lead Channel Setting Pacing Amplitude: 2.75 V
Lead Channel Setting Pacing Pulse Width: 0.8 ms
Lead Channel Setting Sensing Sensitivity: 2 mV
Pulse Gen Model: 2210
Pulse Gen Serial Number: 7168017

## 2017-11-19 LAB — CUP PACEART REMOTE DEVICE CHECK
Battery Remaining Longevity: 26 mo
Battery Remaining Percentage: 29 %
Battery Voltage: 2.81 V
Brady Statistic AP VP Percent: 14 %
Brady Statistic AP VS Percent: 86 %
Brady Statistic AS VP Percent: 1 %
Brady Statistic AS VS Percent: 1 %
Brady Statistic RA Percent Paced: 99 %
Brady Statistic RV Percent Paced: 14 %
Date Time Interrogation Session: 20190627202600
Implantable Lead Implant Date: 20110901
Implantable Lead Implant Date: 20110901
Implantable Lead Location: 753859
Implantable Lead Location: 753860
Implantable Pulse Generator Implant Date: 20110901
Lead Channel Impedance Value: 390 Ohm
Lead Channel Impedance Value: 440 Ohm
Lead Channel Pacing Threshold Amplitude: 0.75 V
Lead Channel Pacing Threshold Amplitude: 2.25 V
Lead Channel Pacing Threshold Pulse Width: 0.4 ms
Lead Channel Pacing Threshold Pulse Width: 0.8 ms
Lead Channel Sensing Intrinsic Amplitude: 2 mV
Lead Channel Sensing Intrinsic Amplitude: 8.3 mV
Lead Channel Setting Pacing Amplitude: 1.75 V
Lead Channel Setting Pacing Amplitude: 2.5 V
Lead Channel Setting Pacing Pulse Width: 0.8 ms
Lead Channel Setting Sensing Sensitivity: 2 mV
Pulse Gen Model: 2210
Pulse Gen Serial Number: 7168017

## 2017-11-22 ENCOUNTER — Telehealth: Payer: Self-pay | Admitting: Cardiovascular Disease

## 2017-11-22 NOTE — Telephone Encounter (Signed)
New Message:     Pt says he is waiting for prior authorization for his Nexium.

## 2017-11-27 IMAGING — DX DG CHEST 2V
2 series · 2 of 2 positions shown · non-contrast
Comparison: 07/29/2015

CLINICAL DATA: Chest pain and shortness of breath tonight with
dizziness and constant shaking. History of coronary artery disease,
pacemaker.

EXAM:
CHEST  2 VIEW

[chest pa]
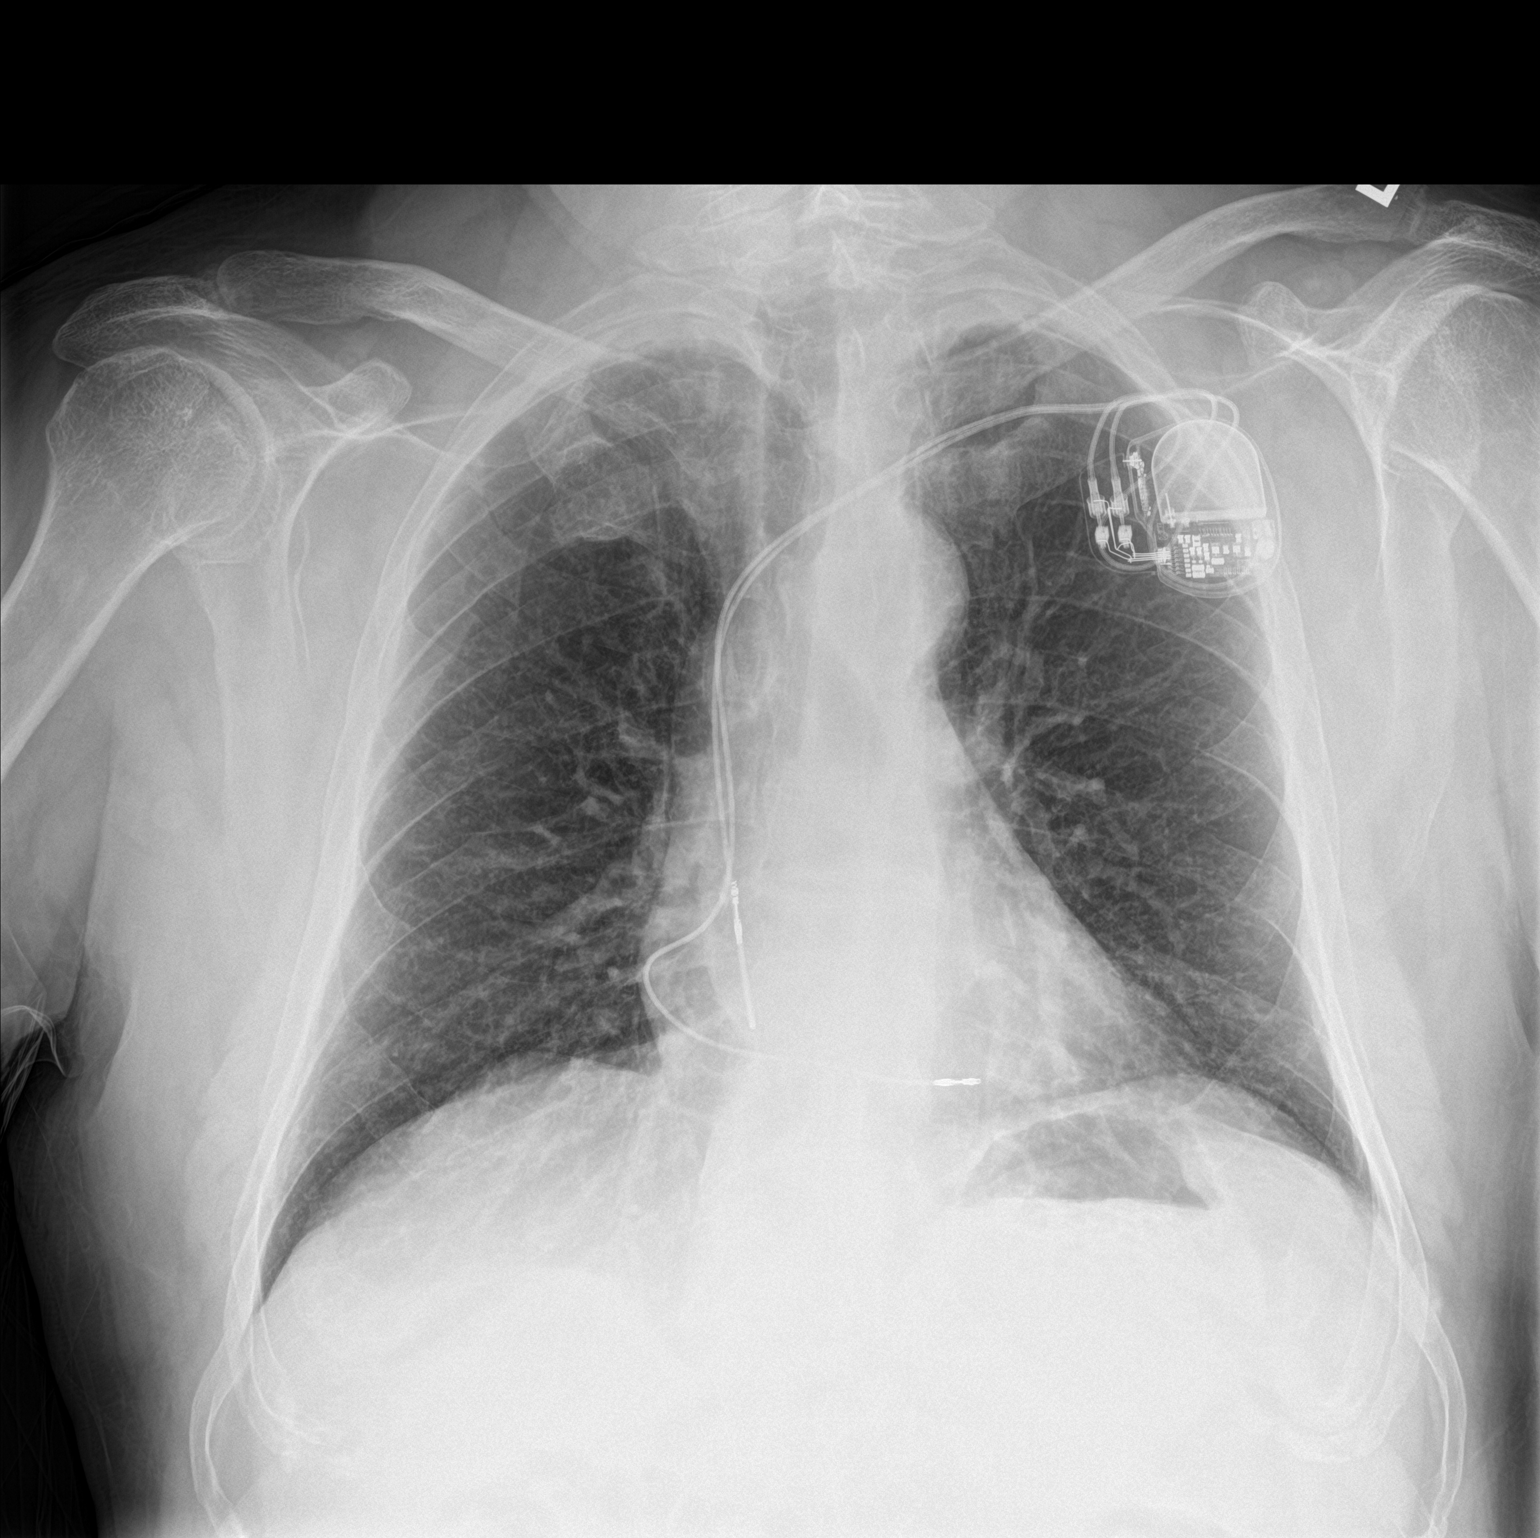

[chest lat]
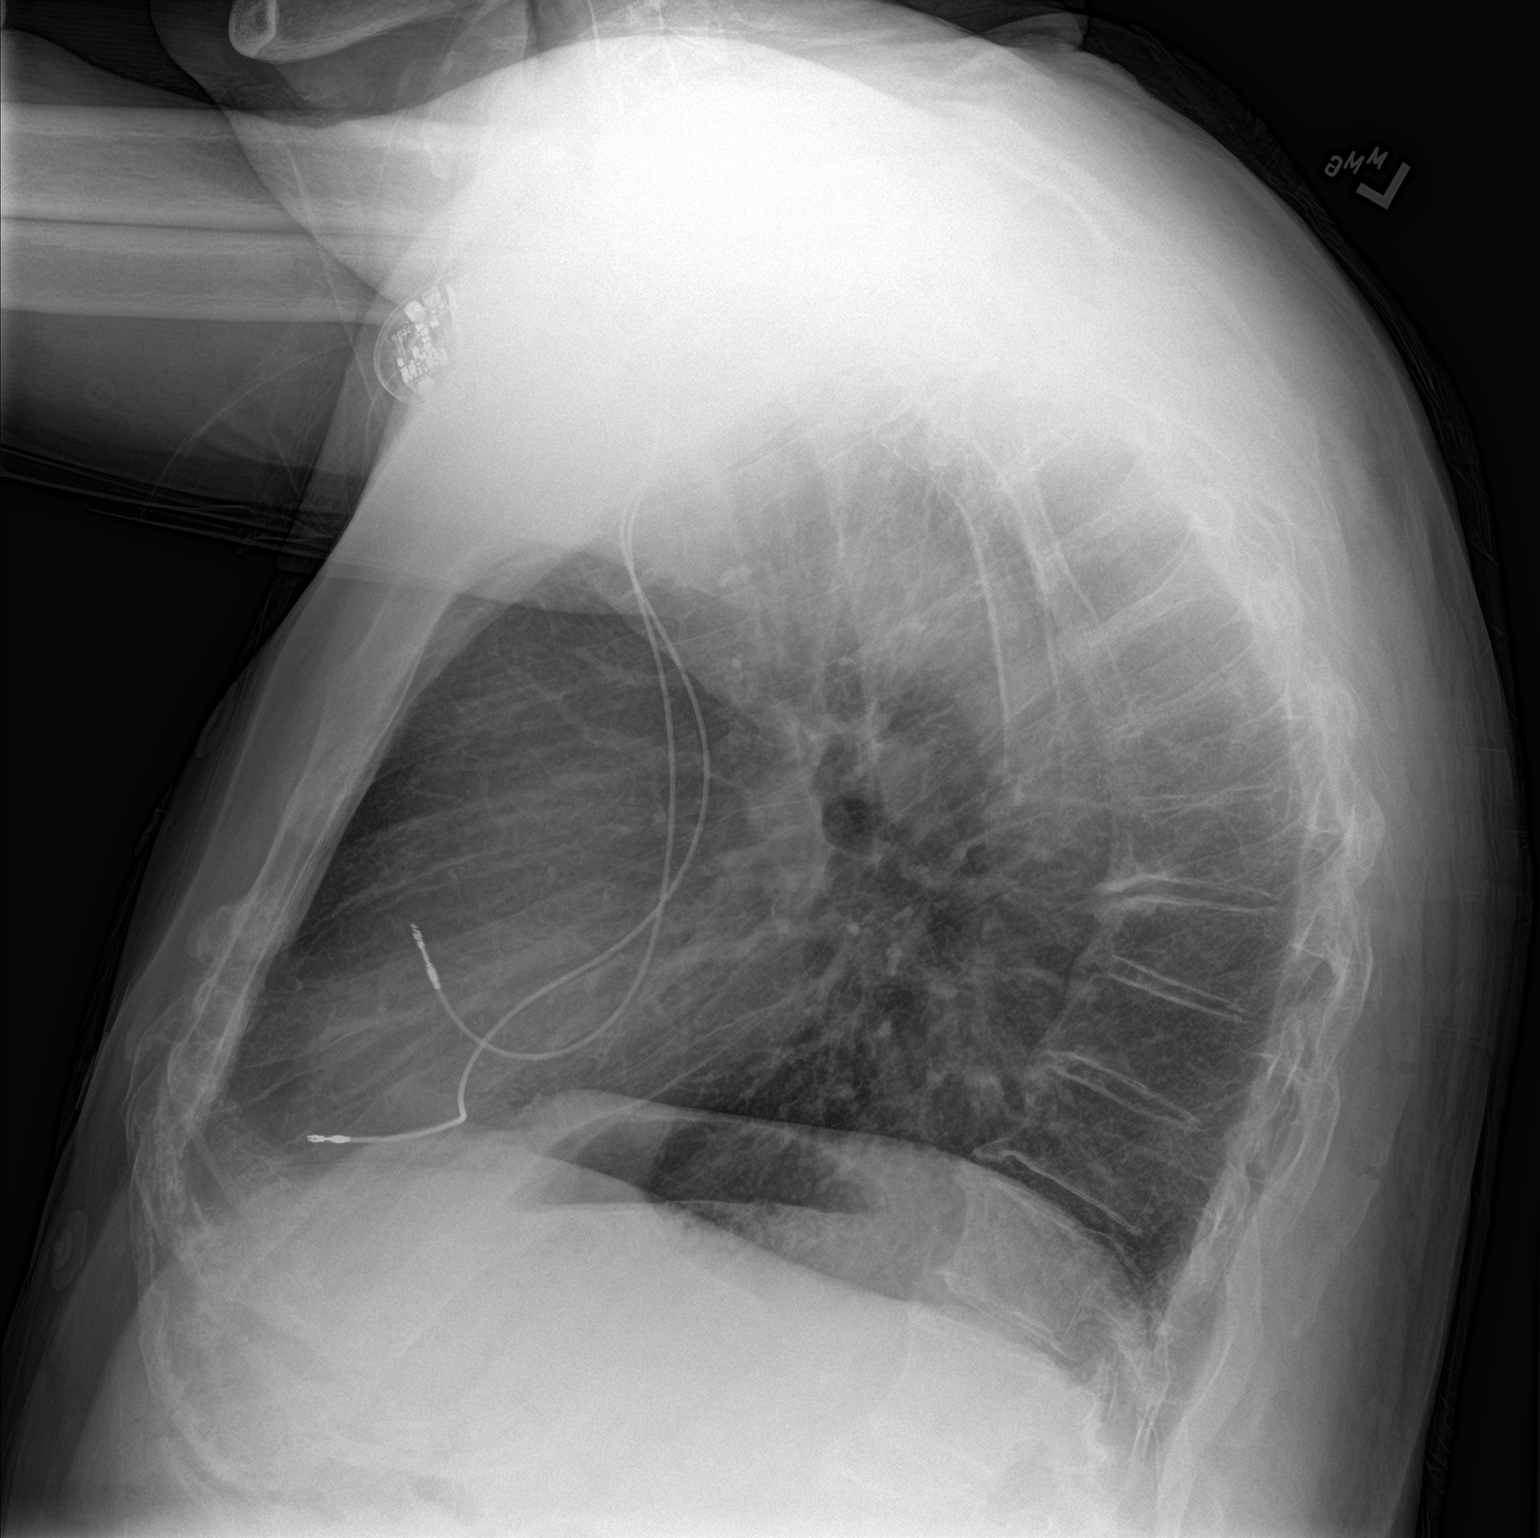

[2 of 2 positions shown; findings below may reference images not displayed]

FINDINGS: Cardiac pacemaker. Normal heart size and pulmonary vascularity. No
focal airspace disease or consolidation in the lungs. No blunting of
costophrenic angles. No pneumothorax. Calcification of the aorta.
IMPRESSION: No active cardiopulmonary disease.

## 2018-02-06 ENCOUNTER — Ambulatory Visit (INDEPENDENT_AMBULATORY_CARE_PROVIDER_SITE_OTHER): Payer: Medicaid Other | Admitting: *Deleted

## 2018-02-06 DIAGNOSIS — I495 Sick sinus syndrome: Secondary | ICD-10-CM | POA: Diagnosis not present

## 2018-02-06 NOTE — Progress Notes (Signed)
Remote pacemaker transmission.   

## 2018-02-07 ENCOUNTER — Encounter: Payer: Self-pay | Admitting: Cardiology

## 2018-03-19 LAB — CUP PACEART REMOTE DEVICE CHECK
Battery Remaining Longevity: 28 mo
Battery Remaining Percentage: 25 %
Battery Voltage: 2.8 V
Brady Statistic AP VP Percent: 13 %
Brady Statistic AP VS Percent: 87 %
Brady Statistic AS VP Percent: 1 %
Brady Statistic AS VS Percent: 1 %
Brady Statistic RA Percent Paced: 99 %
Brady Statistic RV Percent Paced: 13 %
Date Time Interrogation Session: 20190926060010
Implantable Lead Implant Date: 20110901
Implantable Lead Implant Date: 20110901
Implantable Lead Location: 753859
Implantable Lead Location: 753860
Implantable Pulse Generator Implant Date: 20110901
Lead Channel Impedance Value: 390 Ohm
Lead Channel Impedance Value: 460 Ohm
Lead Channel Pacing Threshold Amplitude: 0.625 V
Lead Channel Pacing Threshold Amplitude: 2.125 V
Lead Channel Pacing Threshold Pulse Width: 0.4 ms
Lead Channel Pacing Threshold Pulse Width: 0.8 ms
Lead Channel Sensing Intrinsic Amplitude: 2.4 mV
Lead Channel Sensing Intrinsic Amplitude: 8.2 mV
Lead Channel Setting Pacing Amplitude: 1.625
Lead Channel Setting Pacing Amplitude: 2.375
Lead Channel Setting Pacing Pulse Width: 0.8 ms
Lead Channel Setting Sensing Sensitivity: 2 mV
Pulse Gen Model: 2210
Pulse Gen Serial Number: 7168017

## 2018-04-01 ENCOUNTER — Other Ambulatory Visit: Payer: Self-pay | Admitting: Cardiovascular Disease

## 2018-04-30 ENCOUNTER — Other Ambulatory Visit: Payer: Self-pay | Admitting: Cardiovascular Disease

## 2018-05-08 ENCOUNTER — Ambulatory Visit (INDEPENDENT_AMBULATORY_CARE_PROVIDER_SITE_OTHER): Payer: Medicaid Other

## 2018-05-08 DIAGNOSIS — I495 Sick sinus syndrome: Secondary | ICD-10-CM | POA: Diagnosis not present

## 2018-05-08 NOTE — Progress Notes (Signed)
Remote pacemaker transmission.   

## 2018-05-09 LAB — CUP PACEART REMOTE DEVICE CHECK
Battery Remaining Longevity: 18 mo
Battery Remaining Percentage: 17 %
Battery Voltage: 2.75 V
Brady Statistic AP VP Percent: 12 %
Brady Statistic AP VS Percent: 88 %
Brady Statistic AS VP Percent: 1 %
Brady Statistic AS VS Percent: 1 %
Brady Statistic RA Percent Paced: 99 %
Brady Statistic RV Percent Paced: 12 %
Date Time Interrogation Session: 20191226112826
Implantable Lead Implant Date: 20110901
Implantable Lead Implant Date: 20110901
Implantable Lead Location: 753859
Implantable Lead Location: 753860
Implantable Pulse Generator Implant Date: 20110901
Lead Channel Impedance Value: 340 Ohm
Lead Channel Impedance Value: 410 Ohm
Lead Channel Pacing Threshold Amplitude: 0.625 V
Lead Channel Pacing Threshold Amplitude: 2.125 V
Lead Channel Pacing Threshold Pulse Width: 0.4 ms
Lead Channel Pacing Threshold Pulse Width: 0.8 ms
Lead Channel Sensing Intrinsic Amplitude: 2.5 mV
Lead Channel Sensing Intrinsic Amplitude: 8.6 mV
Lead Channel Setting Pacing Amplitude: 1.625
Lead Channel Setting Pacing Amplitude: 2.375
Lead Channel Setting Pacing Pulse Width: 0.8 ms
Lead Channel Setting Sensing Sensitivity: 2 mV
Pulse Gen Model: 2210
Pulse Gen Serial Number: 7168017

## 2018-05-13 ENCOUNTER — Encounter: Payer: Self-pay | Admitting: Cardiology

## 2018-08-07 ENCOUNTER — Other Ambulatory Visit: Payer: Self-pay

## 2018-08-07 ENCOUNTER — Ambulatory Visit (INDEPENDENT_AMBULATORY_CARE_PROVIDER_SITE_OTHER): Payer: Medicaid Other | Admitting: *Deleted

## 2018-08-07 DIAGNOSIS — I495 Sick sinus syndrome: Secondary | ICD-10-CM

## 2018-08-09 LAB — CUP PACEART REMOTE DEVICE CHECK
Battery Remaining Longevity: 13 mo
Battery Remaining Percentage: 12 %
Battery Voltage: 2.72 V
Brady Statistic AP VP Percent: 12 %
Brady Statistic AP VS Percent: 88 %
Brady Statistic AS VP Percent: 1 %
Brady Statistic AS VS Percent: 1 %
Brady Statistic RA Percent Paced: 99 %
Brady Statistic RV Percent Paced: 12 %
Date Time Interrogation Session: 20200326220403
Implantable Lead Implant Date: 20110901
Implantable Lead Implant Date: 20110901
Implantable Lead Location: 753859
Implantable Lead Location: 753860
Implantable Pulse Generator Implant Date: 20110901
Lead Channel Impedance Value: 430 Ohm
Lead Channel Impedance Value: 460 Ohm
Lead Channel Pacing Threshold Amplitude: 0.75 V
Lead Channel Pacing Threshold Amplitude: 1.625 V
Lead Channel Pacing Threshold Pulse Width: 0.4 ms
Lead Channel Pacing Threshold Pulse Width: 0.8 ms
Lead Channel Sensing Intrinsic Amplitude: 2.1 mV
Lead Channel Sensing Intrinsic Amplitude: 9.4 mV
Lead Channel Setting Pacing Amplitude: 1.75 V
Lead Channel Setting Pacing Amplitude: 1.875
Lead Channel Setting Pacing Pulse Width: 0.8 ms
Lead Channel Setting Sensing Sensitivity: 2 mV
Pulse Gen Model: 2210
Pulse Gen Serial Number: 7168017

## 2018-08-14 NOTE — Progress Notes (Signed)
Remote pacemaker transmission.   

## 2018-11-06 ENCOUNTER — Ambulatory Visit (INDEPENDENT_AMBULATORY_CARE_PROVIDER_SITE_OTHER): Payer: Medicare Other | Admitting: *Deleted

## 2018-11-06 DIAGNOSIS — I495 Sick sinus syndrome: Secondary | ICD-10-CM

## 2018-11-06 LAB — CUP PACEART REMOTE DEVICE CHECK
Battery Remaining Longevity: 12 mo
Battery Remaining Percentage: 10 %
Battery Voltage: 2.71 V
Brady Statistic AP VP Percent: 12 %
Brady Statistic AP VS Percent: 88 %
Brady Statistic AS VP Percent: 1 %
Brady Statistic AS VS Percent: 1 %
Brady Statistic RA Percent Paced: 99 %
Brady Statistic RV Percent Paced: 12 %
Date Time Interrogation Session: 20200625060011
Implantable Lead Implant Date: 20110901
Implantable Lead Implant Date: 20110901
Implantable Lead Location: 753859
Implantable Lead Location: 753860
Implantable Pulse Generator Implant Date: 20110901
Lead Channel Impedance Value: 430 Ohm
Lead Channel Impedance Value: 450 Ohm
Lead Channel Pacing Threshold Amplitude: 0.625 V
Lead Channel Pacing Threshold Amplitude: 1.625 V
Lead Channel Pacing Threshold Pulse Width: 0.4 ms
Lead Channel Pacing Threshold Pulse Width: 0.8 ms
Lead Channel Sensing Intrinsic Amplitude: 1.8 mV
Lead Channel Sensing Intrinsic Amplitude: 8.8 mV
Lead Channel Setting Pacing Amplitude: 1.625
Lead Channel Setting Pacing Amplitude: 1.875
Lead Channel Setting Pacing Pulse Width: 0.8 ms
Lead Channel Setting Sensing Sensitivity: 2 mV
Pulse Gen Model: 2210
Pulse Gen Serial Number: 7168017

## 2018-11-17 ENCOUNTER — Encounter: Payer: Self-pay | Admitting: Cardiology

## 2018-11-17 NOTE — Progress Notes (Signed)
Remote pacemaker transmission.   

## 2019-01-09 ENCOUNTER — Telehealth: Payer: Self-pay

## 2019-01-09 NOTE — Telephone Encounter (Signed)
   [facility] Pre-operative Risk Assessment    Request for surgical clearance:  1. What type of surgery is being performed? Left Total Knee   2. When is this surgery scheduled? TBD   3. What type of clearance is required (medical clearance vs. Pharmacy clearance to hold med vs. Both)? medical  4. Are there any medications that need to be held prior to surgery and how long?n/a   5. Practice name and name of physician performing surgery? [facility] Dr. [name]   6. What is your office phone number. [phone]   7.   What is your office fax number [phone]  8.   Anesthesia type (None, local, MAC, general) ? General   Newt Minion 01/09/2019, 3:52 PM  _________________________________________________________________   (provider comments below)

## 2019-01-11 NOTE — Telephone Encounter (Signed)
   Primary Cardiologist:Mihai Croitoru, MD  Chart reviewed as part of pre-operative protocol coverage. Because of Roy Tucker past medical history and time since last visit, he/she will require a follow-up visit in order to better assess preoperative cardiovascular risk.  Pre-op covering staff: - Please schedule appointment and call patient to inform them. - Please contact requesting surgeon's office via preferred method (i.e, phone, fax) to inform them of need for appointment prior to surgery.  If applicable, this message will also be routed to pharmacy pool and/or primary cardiologist for input on holding anticoagulant/antiplatelet agent as requested below so that this information is available at time of patient's appointment.   [place], [place]  01/11/2019, 4:52 PM

## 2019-01-13 NOTE — Telephone Encounter (Signed)
Sorry. The other appt referenced was for another pt. This pt still needs appt with Dr. Loletha Grayer for preop if he plans to proceed with surgery at some point.   Has not been seen since 09/2017 and needs to be scheduled for a routine follow up in any case.   I will remove this from the preop pool.

## 2019-01-13 NOTE — Telephone Encounter (Signed)
THis is a Dr. Loletha Grayer patient.

## 2019-01-13 NOTE — Telephone Encounter (Signed)
Pt contacted re: surgical clearance. Pt stated that he knew he needed to have it done, but wasn't sure when he wanted to. Pt made aware that he would have to see his Cardiologist for clearance before he could schedule his surgery. Pt understands and says that he would call us back when he was ready to pursue.  Will route back to the requesting surgeon's office to make them aware.

## 2019-01-13 NOTE — Telephone Encounter (Signed)
Different facial hair distribution

## 2019-01-14 NOTE — Telephone Encounter (Signed)
Tried to reach out to pt about needing appt with Dr. Sallyanne Kuster for surgery clearance. No answer.

## 2019-01-21 NOTE — Telephone Encounter (Signed)
Called the patient and his spouse Roy Tucker who is on the patient's DPR answered the number listed. She stated that patient was not home and that she can handle any needs for him that are needed. Patient is scheduledto see Almyra Deforest, PA-C on 01/26/19 at 8:15AM. She verbalized an understanding and all (if any) questions were answered.

## 2019-01-26 ENCOUNTER — Encounter: Payer: Self-pay | Admitting: Physician Assistant

## 2019-01-26 ENCOUNTER — Ambulatory Visit (INDEPENDENT_AMBULATORY_CARE_PROVIDER_SITE_OTHER): Payer: Medicare HMO | Admitting: Physician Assistant

## 2019-01-26 ENCOUNTER — Other Ambulatory Visit: Payer: Self-pay

## 2019-01-26 VITALS — BP 124/78 | HR 60 | Ht 70.0 in | Wt 209.6 lb

## 2019-01-26 DIAGNOSIS — E039 Hypothyroidism, unspecified: Secondary | ICD-10-CM

## 2019-01-26 DIAGNOSIS — Z01810 Encounter for preprocedural cardiovascular examination: Secondary | ICD-10-CM | POA: Diagnosis not present

## 2019-01-26 DIAGNOSIS — I10 Essential (primary) hypertension: Secondary | ICD-10-CM

## 2019-01-26 DIAGNOSIS — E785 Hyperlipidemia, unspecified: Secondary | ICD-10-CM

## 2019-01-26 DIAGNOSIS — Z950 Presence of cardiac pacemaker: Secondary | ICD-10-CM

## 2019-01-26 NOTE — Telephone Encounter (Signed)
   Primary Cardiologist: Sanda Klein, MD  Patient was seen in the cardiology office today for preoperative clearance. He has been doing well from cardiac perspective without any chest pain. Previous myoview in 04/2016 was normal. Chart reviewed as part of pre-operative protocol coverage. Given past medical history and time since last visit, based on ACC/AHA guidelines, Roy Tucker would be at acceptable risk for the planned procedure without further cardiovascular testing.   Please call with questions.  [place], [place] 01/26/2019, 9:32 AM

## 2019-01-26 NOTE — Progress Notes (Unsigned)
   Primary Cardiologist: Sanda Klein, MD  Chart reviewed as part of pre-operative protocol coverage. Given past medical history and time since last visit, based on ACC/AHA guidelines, Roy Tucker would be at acceptable risk for the planned procedure without further cardiovascular testing.   Please call with questions.  [place], [place] 01/26/2019, 9:32 AM

## 2019-01-26 NOTE — Patient Instructions (Signed)
Medication Instructions:  Your physician recommends that you continue on your current medications as directed. Please refer to the Current Medication list given to you today.  If you need a refill on your cardiac medications before your next appointment, please call your pharmacy.   Lab work: NONE ordered at this time of appointment   If you have labs (blood work) drawn today and your tests are completely normal, you will receive your results only by: Marland Kitchen MyChart Message (if you have MyChart) OR . A paper copy in the mail If you have any lab test that is abnormal or we need to change your treatment, we will call you to review the results.  Testing/Procedures: NONE ordered at this time of appointment   Follow-Up: At [facility], you and your health needs are our priority.  As part of our continuing mission to provide you with exceptional heart care, we have created designated Provider Care Teams.  These Care Teams include your primary Cardiologist (physician) and Advanced Practice Providers (APPs -  Physician Assistants and Nurse Practitioners) who all work together to provide you with the care you need, when you need it. Your physician recommends that you schedule follow-up as scheduled with Dr. Sallyanne Kuster in November 2020  Any Other Special Instructions Will Be Listed Below (If Applicable).

## 2019-01-26 NOTE — Telephone Encounter (Signed)
Follow up  Per Maudie Mercury she states that there is not a DOB associated with the information that was faxed over to her for this patient. Please call to  verify the DOB.

## 2019-01-26 NOTE — Telephone Encounter (Signed)
      Pts DOB is [birth year] Roy Tucker

## 2019-01-26 NOTE — Progress Notes (Signed)
Cardiology Office Note    Date:  01/27/2019   ID:  Roy Tucker, DOB Jun 11, 1950, MRN [id number]  PCP:  Everardo Beals, NP  Cardiologist:  Dr. Sallyanne Kuster  Chief Complaint  Patient presents with  . Pre-op Exam    left total knee surgery by Dr. Ronnie Derby, seen for Dr. Sallyanne Kuster.    History of Present Illness:  Roy Tucker is a 68 y.o. male with PMH of sinus node dysfunction s/p St Jude PPM 2011, HTN, HLD, hypothyroidism and RA.  Previous echocardiogram obtained on 01/09/2010 showed a normal EF 55 to 60%, no regional wall motion abnormality.  Myoview obtained on 05/02/2016 was low risk, EF 57%, no significant EKG changes, TID ratio greater than 1.2.  He was last seen by Dr. Sallyanne Kuster on 10/10/2017, there was some inconsistency between his medication list here and at her PCPs office.  This was clarified further.  Most recent device interrogation was in June 2020, normal device function, no clinically significant episode of PAT or ventricular tachycardia.    He is planning to have left total knee surgery by Dr. [name], he presents today for cardiac clearance.  He denies any recent chest pain.  EKG shows no obvious ischemic changes.  He is AV paced.  Otherwise he does have occasional shortness of breath when he walks around with a cane however this has not stopped him from walking a quarter of a mile frequently.  On physical exam, he has no lower extremity edema, he has no orthopnea or PND.  Shortness of breath only occurs with more extreme activity.  From the cardiology perspective, no additional work-up is recommended given fairly normal stress test near the end of 2017 and the lack of any anginal symptoms.  He can follow-up with Dr. Sallyanne Kuster in November 2020 for device management.   Past Medical History:  Diagnosis Date  . Arthritis   . Atrial tachycardia ([place])   . Bradycardia   . Chronic pain   . Dysrhythmia    bradycardia  . GERD (gastroesophageal reflux disease)   . Gout    . Hypertension   . Hyperthyroidism    graves on prednisone  . Pacemaker   . Pneumonia    few months ago  . Sinus node dysfunction ([place]) 01/12/2010   St.Jude pacemaker    Past Surgical History:  Procedure Laterality Date  . COLONOSCOPY W/ POLYPECTOMY    . NM MYOCAR PERF WALL MOTION  07/05/2010   no significant ischemia  . PACEMAKER INSERTION  01/12/2010   St.Jude Accent DR RF  . TOTAL KNEE ARTHROPLASTY  12/10/2011   Procedure: TOTAL KNEE ARTHROPLASTY;  Surgeon: Rudean Haskell, MD;  Location: [facility];  Service: Orthopedics;  Laterality: Right;    Current Medications: Outpatient Medications Prior to Visit  Medication Sig Dispense Refill  . albuterol (PROVENTIL HFA;VENTOLIN HFA) 108 (90 BASE) MCG/ACT inhaler Inhale 2 puffs into the lungs every 6 (six) hours as needed for wheezing or shortness of breath. 1 Inhaler 0  . ALPRAZolam (XANAX) 0.5 MG tablet Take 0.5 mg by mouth at bedtime as needed for anxiety.    Marland Kitchen atenolol (TENORMIN) 25 MG tablet Take 0.5 tablets (12.5 mg total) by mouth daily. 15 tablet 11  . atorvastatin (LIPITOR) 20 MG tablet Take 1 tablet by mouth every evening.    Marland Kitchen esomeprazole (NEXIUM) 40 MG capsule Take 1 capsule (40 mg total) by mouth 2 (two) times daily before a meal. 60 capsule 1  . HYDROcodone-acetaminophen (NORCO) 10-325  MG per tablet Take 1 tablet by mouth every morning.    Marland Kitchen levothyroxine (SYNTHROID, LEVOTHROID) 175 MCG tablet Take 175 mcg by mouth daily before breakfast.    . loratadine (CLARITIN) 10 MG tablet Take 10 mg by mouth daily.  5  . meloxicam (MOBIC) 15 MG tablet Take 1 tablet by mouth every morning.    Marland Kitchen atenolol (TENORMIN) 25 MG tablet Take 1 tablet (25 mg total) by mouth daily. NEED OV. (Patient not taking: Reported on 01/26/2019) 15 tablet 0   No facility-administered medications prior to visit.      Allergies:   Infliximab, Methimazole, Neurontin [gabapentin], Aspirin, and Tylenol [acetaminophen]   Social History   Socioeconomic History  .  Marital status: Married    Spouse name: Not on file  . Number of children: Not on file  . Years of education: Not on file  . Highest education level: Not on file  Occupational History  . Not on file  Social Needs  . Financial resource strain: Not on file  . Food insecurity    Worry: Not on file    Inability: Not on file  . Transportation needs    Medical: Not on file    Non-medical: Not on file  Tobacco Use  . Smoking status: Former Smoker    Packs/day: 1.00    Years: 15.00    Pack years: 15.00    Types: Cigarettes    Quit date: 12/03/2003    Years since quitting: 15.1  . Smokeless tobacco: Never Used  Substance and Sexual Activity  . Alcohol use: No  . Drug use: No  . Sexual activity: Not Currently  Lifestyle  . Physical activity    Days per week: Not on file    Minutes per session: Not on file  . Stress: Not on file  Relationships  . Social Musician on phone: Not on file    Gets together: Not on file    Attends religious service: Not on file    Active member of club or organization: Not on file    Attends meetings of clubs or organizations: Not on file    Relationship status: Not on file  Other Topics Concern  . Not on file  Social History Narrative  . Not on file     Family History:  The patient's family history includes Cancer in his father; Heart disease in his brother; Heart failure in an other family member.   ROS:   Please see the history of present illness.    ROS All other systems reviewed and are negative.   PHYSICAL EXAM:   VS:  BP 124/78   Pulse 60   Ht 5\' 10"  (1.778 m)   Wt 209 lb 9.6 oz (95.1 kg)   SpO2 97%   BMI 30.07 kg/m    GEN: Well nourished, well developed, in no acute distress  HEENT: normal  Neck: no JVD, carotid bruits, or masses Cardiac: RRR; no murmurs, rubs, or gallops,no edema  Respiratory:  clear to auscultation bilaterally, normal work of breathing GI: soft, nontender, nondistended, + BS MS: no deformity or  atrophy  Skin: warm and dry, no rash Neuro:  Alert and Oriented x 3, Strength and sensation are intact Psych: euthymic mood, full affect  Wt Readings from Last 3 Encounters:  01/26/19 209 lb 9.6 oz (95.1 kg)  10/10/17 216 lb 12.8 oz (98.3 kg)  07/11/16 216 lb (98 kg)      Studies/Labs Reviewed:  EKG:  EKG is ordered today.  The ekg ordered today demonstrates AV paced rhythm  Recent Labs: No results found for requested labs within last 8760 hours.   Lipid Panel    Component Value Date/Time   CHOL 156 01/30/2016 1004   TRIG 112 01/30/2016 1004   HDL 47 01/30/2016 1004   CHOLHDL 3.3 01/30/2016 1004   VLDL 22 01/30/2016 1004   LDLCALC 87 01/30/2016 1004    Additional studies/ records that were reviewed today include:   Echo 2011 Study Conclusions  Left ventricle: The cavity size was normal. Systolic function was  normal. The estimated ejection fraction was in the range of 55% to  60%. Wall motion was normal; there were no regional wall motion  abnormalities.     Myoview 05/04/2016 Study Highlights   The left ventricular ejection fraction is normal (55-65%).  Nuclear stress EF: 57%.  There was no ST segment deviation noted during stress.  TID ratio is greater than 1.2, indicating that balanced ischemia cannot be excluded.  The study is normal.  This is a low risk study.      ASSESSMENT:    1. Preop cardiovascular exam   2. Pacemaker   3. Essential hypertension   4. Hyperlipidemia LDL goal <100   5. Hypothyroidism, unspecified type      PLAN:  In order of problems listed above:  1. Preoperative clearance: EKG showed no ischemic changes.  Previous Myoview in December 2017 was normal.  Patient does not have any prior history of coronary artery disease.  He is cleared to proceed with knee surgery by Dr. Sherlean FootLucey from cardiology perspective.  No further work-up is recommended at this time  2. History of sinus node dysfunction s/p St Jude PPM in  2011: Followed by Dr. Royann Shiversroitoru, last interrogation showed stable battery life and device function  3. Hypertension: Blood pressure stable on current therapy  4. Hyperlipidemia: On Lipitor 20 mg daily  5. Hypothyroidism: Managed by primary care provider    Medication Adjustments/Labs and Tests Ordered: Current medicines are reviewed at length with the patient today.  Concerns regarding medicines are outlined above.  Medication changes, Labs and Tests ordered today are listed in the Patient Instructions below. Patient Instructions  Medication Instructions:  Your physician recommends that you continue on your current medications as directed. Please refer to the Current Medication list given to you today.  If you need a refill on your cardiac medications before your next appointment, please call your pharmacy.   Lab work: NONE ordered at this time of appointment   If you have labs (blood work) drawn today and your tests are completely normal, you will receive your results only by: Marland Kitchen. MyChart Message (if you have MyChart) OR . A paper copy in the mail If you have any lab test that is abnormal or we need to change your treatment, we will call you to review the results.  Testing/Procedures: NONE ordered at this time of appointment   Follow-Up: At [facility], you and your health needs are our priority.  As part of our continuing mission to provide you with exceptional heart care, we have created designated Provider Care Teams.  These Care Teams include your primary Cardiologist (physician) and Advanced Practice Providers (APPs -  Physician Assistants and Nurse Practitioners) who all work together to provide you with the care you need, when you need it. Your physician recommends that you schedule follow-up as scheduled with Dr. Royann Shiversroitoru in November 2020  Any Other  Special Instructions Will Be Listed Below (If Applicable).      Ramond DialSigned, Thomes Burak, [place]  01/27/2019 10:28 AM    [facility] [address], [place], [place]  [address] Phone: [phone]) [phone]; Fax: [phone]) [phone]

## 2019-01-27 NOTE — Progress Notes (Signed)
Th[place]on[place]!

## 2019-01-27 NOTE — Telephone Encounter (Signed)
I have refaxed it.  

## 2019-02-05 ENCOUNTER — Telehealth: Payer: Self-pay | Admitting: *Deleted

## 2019-02-05 ENCOUNTER — Ambulatory Visit (INDEPENDENT_AMBULATORY_CARE_PROVIDER_SITE_OTHER): Payer: Medicare HMO | Admitting: *Deleted

## 2019-02-05 DIAGNOSIS — I495 Sick sinus syndrome: Secondary | ICD-10-CM | POA: Diagnosis not present

## 2019-02-05 LAB — CUP PACEART REMOTE DEVICE CHECK
Battery Remaining Longevity: 5 mo
Battery Remaining Percentage: 5 %
Battery Voltage: 2.66 V
Brady Statistic AP VP Percent: 13 %
Brady Statistic AP VS Percent: 87 %
Brady Statistic AS VP Percent: 1 %
Brady Statistic AS VS Percent: 1 %
Brady Statistic RA Percent Paced: 99 %
Brady Statistic RV Percent Paced: 13 %
Date Time Interrogation Session: 20200924090849
Implantable Lead Implant Date: 20110901
Implantable Lead Implant Date: 20110901
Implantable Lead Location: 753859
Implantable Lead Location: 753860
Implantable Pulse Generator Implant Date: 20110901
Lead Channel Impedance Value: 360 Ohm
Lead Channel Impedance Value: 440 Ohm
Lead Channel Pacing Threshold Amplitude: 0.625 V
Lead Channel Pacing Threshold Amplitude: 2 V
Lead Channel Pacing Threshold Pulse Width: 0.4 ms
Lead Channel Pacing Threshold Pulse Width: 0.8 ms
Lead Channel Sensing Intrinsic Amplitude: 3.5 mV
Lead Channel Sensing Intrinsic Amplitude: 9.7 mV
Lead Channel Setting Pacing Amplitude: 1.625
Lead Channel Setting Pacing Amplitude: 2.25 V
Lead Channel Setting Pacing Pulse Width: 0.8 ms
Lead Channel Setting Sensing Sensitivity: 2 mV
Pulse Gen Model: 2210
Pulse Gen Serial Number: 7168017

## 2019-02-05 NOTE — Telephone Encounter (Signed)
Spoke with Izora Gala (DPR) regarding PPM nearing ERI. Estimated longevity as of 02/05/19 transmission is 5.2 months remaining. Advised of monthly automatic battery checks via home monitor. [place] phone number if pt has any questions or concerns. Izora Gala denies any questions at this time.

## 2019-02-16 ENCOUNTER — Encounter: Payer: Self-pay | Admitting: Cardiology

## 2019-02-16 NOTE — Progress Notes (Signed)
Remote pacemaker transmission.   

## 2019-02-23 ENCOUNTER — Other Ambulatory Visit: Payer: Self-pay | Admitting: Orthopedic Surgery

## 2019-03-09 ENCOUNTER — Ambulatory Visit (INDEPENDENT_AMBULATORY_CARE_PROVIDER_SITE_OTHER): Payer: Medicare HMO | Admitting: *Deleted

## 2019-03-09 DIAGNOSIS — I495 Sick sinus syndrome: Secondary | ICD-10-CM

## 2019-03-09 DIAGNOSIS — I471 Supraventricular tachycardia: Secondary | ICD-10-CM

## 2019-03-10 LAB — CUP PACEART REMOTE DEVICE CHECK
Battery Remaining Longevity: 4 mo
Battery Remaining Percentage: 3 %
Battery Voltage: 2.65 V
Brady Statistic AP VP Percent: 13 %
Brady Statistic AP VS Percent: 87 %
Brady Statistic AS VP Percent: 1 %
Brady Statistic AS VS Percent: 1 %
Brady Statistic RA Percent Paced: 99 %
Brady Statistic RV Percent Paced: 13 %
Date Time Interrogation Session: 20201026172031
Implantable Lead Implant Date: 20110901
Implantable Lead Implant Date: 20110901
Implantable Lead Location: 753859
Implantable Lead Location: 753860
Implantable Pulse Generator Implant Date: 20110901
Lead Channel Impedance Value: 360 Ohm
Lead Channel Impedance Value: 450 Ohm
Lead Channel Pacing Threshold Amplitude: 0.75 V
Lead Channel Pacing Threshold Amplitude: 1.875 V
Lead Channel Pacing Threshold Pulse Width: 0.4 ms
Lead Channel Pacing Threshold Pulse Width: 0.8 ms
Lead Channel Sensing Intrinsic Amplitude: 1.2 mV
Lead Channel Sensing Intrinsic Amplitude: 9.5 mV
Lead Channel Setting Pacing Amplitude: 1.75 V
Lead Channel Setting Pacing Amplitude: 2.125
Lead Channel Setting Pacing Pulse Width: 0.8 ms
Lead Channel Setting Sensing Sensitivity: 2 mV
Pulse Gen Model: 2210
Pulse Gen Serial Number: 7168017

## 2019-03-26 ENCOUNTER — Encounter (HOSPITAL_COMMUNITY): Payer: Self-pay

## 2019-03-26 ENCOUNTER — Other Ambulatory Visit (HOSPITAL_COMMUNITY)
Admission: RE | Admit: 2019-03-26 | Discharge: 2019-03-26 | Disposition: A | Discharge status: Home / Self Care | Payer: Medicare HMO | Source: Ambulatory Visit | Attending: Orthopedic Surgery | Admitting: Orthopedic Surgery

## 2019-03-26 DIAGNOSIS — Z20828 Contact with and (suspected) exposure to other viral communicable diseases: Secondary | ICD-10-CM | POA: Insufficient documentation

## 2019-03-26 DIAGNOSIS — Z01812 Encounter for preprocedural laboratory examination: Secondary | ICD-10-CM | POA: Diagnosis present

## 2019-03-27 ENCOUNTER — Encounter (HOSPITAL_COMMUNITY): Payer: Self-pay

## 2019-03-27 ENCOUNTER — Encounter (HOSPITAL_COMMUNITY)
Admission: RE | Admit: 2019-03-27 | Discharge: 2019-03-27 | Disposition: A | Discharge status: Home / Self Care | Payer: Medicare HMO | Source: Ambulatory Visit | Attending: Orthopedic Surgery | Admitting: Orthopedic Surgery

## 2019-03-27 ENCOUNTER — Other Ambulatory Visit: Payer: Self-pay

## 2019-03-27 DIAGNOSIS — I495 Sick sinus syndrome: Secondary | ICD-10-CM | POA: Insufficient documentation

## 2019-03-27 DIAGNOSIS — M1712 Unilateral primary osteoarthritis, left knee: Secondary | ICD-10-CM | POA: Diagnosis not present

## 2019-03-27 DIAGNOSIS — Z01812 Encounter for preprocedural laboratory examination: Secondary | ICD-10-CM | POA: Insufficient documentation

## 2019-03-27 DIAGNOSIS — Z950 Presence of cardiac pacemaker: Secondary | ICD-10-CM | POA: Diagnosis not present

## 2019-03-27 DIAGNOSIS — Z79899 Other long term (current) drug therapy: Secondary | ICD-10-CM | POA: Diagnosis not present

## 2019-03-27 DIAGNOSIS — E0590 Thyrotoxicosis, unspecified without thyrotoxic crisis or storm: Secondary | ICD-10-CM | POA: Diagnosis not present

## 2019-03-27 DIAGNOSIS — I10 Essential (primary) hypertension: Secondary | ICD-10-CM | POA: Insufficient documentation

## 2019-03-27 LAB — COMPREHENSIVE METABOLIC PANEL
ALT: 20 U/L (ref 0–44)
AST: 23 U/L (ref 15–41)
Albumin: 4.6 g/dL (ref 3.5–5.0)
Alkaline Phosphatase: 93 U/L (ref 38–126)
Anion gap: 9 (ref 5–15)
BUN: 13 mg/dL (ref 8–23)
CO2: 27 mmol/L (ref 22–32)
Calcium: 9.6 mg/dL (ref 8.9–10.3)
Chloride: 105 mmol/L (ref 98–111)
Creatinine, Ser: 0.94 mg/dL (ref 0.61–1.24)
GFR calc Af Amer: >60 mL/min (ref >60)
GFR calc non Af Amer: >60 mL/min (ref >60)
Glucose, Bld: 96 mg/dL (ref 70–99)
Potassium: 5 mmol/L (ref 3.5–5.1)
Sodium: 141 mmol/L (ref 135–145)
Total Bilirubin: 2 mg/dL — ABNORMAL HIGH (ref 0.3–1.2)
Total Protein: 7.9 g/dL (ref 6.5–8.1)

## 2019-03-27 LAB — NOVEL CORONAVIRUS, NAA (HOSP ORDER, SEND-OUT TO REF LAB; TAT 18-24 HRS): SARS-CoV-2, NAA: NOT DETECTED

## 2019-03-27 LAB — SURGICAL PCR SCREEN
MRSA, PCR: NEGATIVE
Staphylococcus aureus: NEGATIVE

## 2019-03-27 LAB — CBC WITH DIFFERENTIAL/PLATELET
Abs Immature Granulocytes: 0.01 10*3/uL (ref 0.00–0.07)
Basophils Absolute: 0 10*3/uL (ref 0.0–0.1)
Basophils Relative: 0 %
Eosinophils Absolute: 0 10*3/uL (ref 0.0–0.5)
Eosinophils Relative: 1 %
HCT: 42.9 % (ref 39.0–52.0)
Hemoglobin: 14 g/dL (ref 13.0–17.0)
Immature Granulocytes: 0 %
Lymphocytes Relative: 41 %
Lymphs Abs: 1.6 10*3/uL (ref 0.7–4.0)
MCH: 28.9 pg (ref 26.0–34.0)
MCHC: 32.6 g/dL (ref 30.0–36.0)
MCV: 88.6 fL (ref 80.0–100.0)
Monocytes Absolute: 0.3 10*3/uL (ref 0.1–1.0)
Monocytes Relative: 7 %
Neutro Abs: 2 10*3/uL (ref 1.7–7.7)
Neutrophils Relative %: 51 %
Platelets: 116 10*3/uL — ABNORMAL LOW (ref 150–400)
RBC: 4.84 MIL/uL (ref 4.22–5.81)
RDW: 12.8 % (ref 11.5–15.5)
WBC: 4 10*3/uL (ref 4.0–10.5)
nRBC: 0 % (ref 0.0–0.2)

## 2019-03-27 NOTE — Progress Notes (Signed)
PCP - Everardo Beals Cardiologist - Dr. Elpidio Galea Meg,Hao PA 01-26-19 epic  Chest x-ray -  EKG - 01-24-19 epic Stress Test -  ECHO -  Cardiac Cath -  Last device check 03-09-19 epic Pacemaker orders on chart  Sleep Study -  CPAP -   Fasting Blood Sugar -  Checks Blood Sugar _____ times a day  Blood Thinner Instructions: Aspirin Instructions: Last Dose:  Anesthesia review: Pacemaker   Patient denies shortness of breath, fever, cough and chest pain at PAT appointment   none   Patient verbalized understanding of instructions that were given to them at the PAT appointment. Patient was also instructed that they will need to review over the PAT instructions again at home before surgery.

## 2019-03-27 NOTE — Patient Instructions (Signed)
DUE TO COVID-19 ONLY ONE VISITOR IS ALLOWED TO COME WITH YOU AND STAY IN THE WAITING ROOM ONLY DURING PRE OP AND PROCEDURE DAY OF SURGERY. THE 1 VISITOR MAY VISIT WITH YOU AFTER SURGERY IN YOUR PRIVATE ROOM DURING VISITING HOURS ONLY!  YOU NEED TO HAVE A COVID 19 TEST ON_______ @_______ , THIS TEST MUST BE DONE BEFORE SURGERY, COME  Roy Tucker, [place] , [address].  ([place]) ONCE YOUR COVID TEST IS COMPLETED, PLEASE BEGIN THE QUARANTINE INSTRUCTIONS AS OUTLINED IN YOUR HANDOUT.                Roy Tucker  03/27/2019   Your procedure is scheduled on: 03-30-19   Report to [facility] Main  Entrance   Report to admitting at       0855 AM     Call this number if you have problems the morning of surgery [phone]    Remember: NO SOLID FOOD AFTER MIDNIGHT THE NIGHT PRIOR TO SURGERY. NOTHING BY MOUTH EXCEPT CLEAR LIQUIDS UNTIL     0925 am . PLEASE FINISH ENSURE DRINK PER SURGEON ORDER  WHICH NEEDS TO BE COMPLETED AT       925 am then nothing by mouth .     CLEAR LIQUID DIET   Foods Allowed                                                                                            Foods Excluded  Coffee and tea, regular and decaf   No creamer                                                       liquids that you cannot  Plain Jell-O any favor except red or purple                                           see through such as: Fruit ices (not with fruit pulp)                                                                 milk, soups, orange juice  Iced Popsicles                                                                  All solid food Carbonated beverages, regular and diet  Cranberry, grape and apple juices Sports drinks like Gatorade Lightly seasoned clear broth or consume(fat free) Sugar, honey syrup     BRUSH YOUR TEETH MORNING OF SURGERY AND RINSE YOUR MOUTH OUT, NO CHEWING GUM CANDY OR  MINTS.     Take these medicines the morning of surgery with A SIP OF WATER: loratadine, levothyroxine, hydrocodone if needed, nexium, lipitor, atenelol, xanax if needed, inhaler and  Bring with you                                 You may not have any metal on your body including hair pins and              piercings  Do not wear jewelry, , lotions, powders or perfumes, deodorant       .              Men may shave face and neck.   Do not bring valuables to the hospital. [facility] IS NOT             RESPONSIBLE   FOR VALUABLES.  Contacts, dentures or bridgework may not be worn into surgery.                 Please read over the following fact sheets you were given: _____________________________________________________________________           [facility] - Preparing for Surgery Before surgery, you can play an important role.  Because skin is not sterile, your skin needs to be as free of germs as possible.  You can reduce the number of germs on your skin by washing with CHG (chlorahexidine gluconate) soap before surgery.  CHG is an antiseptic cleaner which kills germs and bonds with the skin to continue killing germs even after washing. Please DO NOT use if you have an allergy to CHG or antibacterial soaps.  If your skin becomes reddened/irritated stop using the CHG and inform your nurse when you arrive at Short Stay. Do not shave (including legs and underarms) for at least 48 hours prior to the first CHG shower.  You may shave your face/neck. Please follow these instructions carefully:  1.  Shower with CHG Soap the night before surgery and the  morning of Surgery.  2.  If you choose to wash your hair, wash your hair first as usual with your  normal  shampoo.  3.  After you shampoo, rinse your hair and body thoroughly to remove the  shampoo.                           4.  Use CHG as you would any other liquid soap.  You can apply chg directly  to the skin and wash                        Gently with a scrungie or clean washcloth.  5.  Apply the CHG Soap to your body ONLY FROM THE NECK DOWN.   Do not use on face/ open                           Wound or open sores. Avoid contact with eyes, ears mouth and genitals (private parts).  Wash face,  Genitals (private parts) with your normal soap.             6.  Wash thoroughly, paying special attention to the area where your surgery  will be performed.  7.  Thoroughly rinse your body with warm water from the neck down.  8.  DO NOT shower/wash with your normal soap after using and rinsing off  the CHG Soap.                9.  Pat yourself dry with a clean towel.            10.  Wear clean pajamas.            11.  Place clean sheets on your bed the night of your first shower and do not  sleep with pets. Day of Surgery : Do not apply any lotions/deodorants the morning of surgery.  Please wear clean clothes to the hospital/surgery center.  FAILURE TO FOLLOW THESE INSTRUCTIONS MAY RESULT IN THE CANCELLATION OF YOUR SURGERY PATIENT SIGNATURE_________________________________  NURSE SIGNATURE__________________________________  ________________________________________________________________________   Roy Tucker  An incentive spirometer is a tool that can help keep your lungs clear and active. This tool measures how well you are filling your lungs with each breath. Taking long deep breaths may help reverse or decrease the chance of developing breathing (pulmonary) problems (especially infection) following:  A long period of time when you are unable to move or be active. BEFORE THE PROCEDURE   If the spirometer includes an indicator to show your best effort, your nurse or respiratory therapist will set it to a desired goal.  If possible, sit up straight or lean slightly forward. Try not to slouch.  Hold the incentive spirometer in an upright position. INSTRUCTIONS FOR USE  1. Sit on the edge of your bed  if possible, or sit up as far as you can in bed or on a chair. 2. Hold the incentive spirometer in an upright position. 3. Breathe out normally. 4. Place the mouthpiece in your mouth and seal your lips tightly around it. 5. Breathe in slowly and as deeply as possible, raising the piston or the ball toward the top of the column. 6. Hold your breath for 3-5 seconds or for as long as possible. Allow the piston or ball to fall to the bottom of the column. 7. Remove the mouthpiece from your mouth and breathe out normally. 8. Rest for a few seconds and repeat Steps 1 through 7 at least 10 times every 1-2 hours when you are awake. Take your time and take a few normal breaths between deep breaths. 9. The spirometer may include an indicator to show your best effort. Use the indicator as a goal to work toward during each repetition. 10. After each set of 10 deep breaths, practice coughing to be sure your lungs are clear. If you have an incision (the cut made at the time of surgery), support your incision when coughing by placing a pillow or rolled up towels firmly against it. Once you are able to get out of bed, walk around indoors and cough well. You may stop using the incentive spirometer when instructed by your caregiver.  RISKS AND COMPLICATIONS  Take your time so you do not get dizzy or light-headed.  If you are in pain, you may need to take or ask for pain medication before doing incentive spirometry. It is harder to take a deep breath if you are having pain.  AFTER USE  Rest and breathe slowly and easily.  It can be helpful to keep track of a log of your progress. Your caregiver can provide you with a simple table to help with this. If you are using the spirometer at home, follow these instructions: SEEK MEDICAL CARE IF:   You are having difficultly using the spirometer.  You have trouble using the spirometer as often as instructed.  Your pain medication is not giving enough relief while  using the spirometer.  You develop fever of 100.5 F (38.1 C) or higher. SEEK IMMEDIATE MEDICAL CARE IF:   You cough up bloody sputum that had not been present before.  You develop fever of 102 F (38.9 C) or greater.  You develop worsening pain at or near the incision site. MAKE SURE YOU:   Understand these instructions.  Will watch your condition.  Will get help right away if you are not doing well or get worse. Document Released: 09/10/2006 Document Revised: 07/23/2011 Document Reviewed: 11/11/2006 [facility] Patient Information 2014 [employer], [place].   ________________________________________________________________________

## 2019-03-27 NOTE — Addendum Note (Signed)
Addended by: Douglass Rivers D on: 03/27/2019 04:17 PM   Modules accepted: Level of Service

## 2019-03-27 NOTE — Progress Notes (Signed)
Remote pacemaker transmission.   

## 2019-03-29 MED ORDER — BUPIVACAINE LIPOSOME 1.3 % IJ SUSP
20.0000 mL | Freq: Once | INTRAMUSCULAR | Status: DC
  Filled 2019-03-29 (×2): qty 20

## 2019-03-30 ENCOUNTER — Other Ambulatory Visit: Payer: Self-pay

## 2019-03-30 ENCOUNTER — Observation Stay (HOSPITAL_COMMUNITY)
Admission: RE | Admit: 2019-03-30 | Discharge: 2019-03-31 | Disposition: A | Discharge status: Home / Self Care | Payer: Medicare HMO | Attending: Orthopedic Surgery | Admitting: Orthopedic Surgery

## 2019-03-30 ENCOUNTER — Encounter (HOSPITAL_COMMUNITY): Payer: Self-pay

## 2019-03-30 ENCOUNTER — Encounter (HOSPITAL_COMMUNITY)
Admission: RE | Disposition: A | Discharge status: Home / Self Care | Payer: Self-pay | Source: Home / Self Care | Attending: Orthopedic Surgery

## 2019-03-30 ENCOUNTER — Ambulatory Visit (HOSPITAL_COMMUNITY): Payer: Medicare HMO | Admitting: Certified Registered Nurse Anesthetist

## 2019-03-30 ENCOUNTER — Ambulatory Visit (HOSPITAL_COMMUNITY): Payer: Medicare HMO | Admitting: Physician Assistant

## 2019-03-30 DIAGNOSIS — Z886 Allergy status to analgesic agent status: Secondary | ICD-10-CM | POA: Insufficient documentation

## 2019-03-30 DIAGNOSIS — Z96659 Presence of unspecified artificial knee joint: Secondary | ICD-10-CM

## 2019-03-30 DIAGNOSIS — Z79890 Hormone replacement therapy (postmenopausal): Secondary | ICD-10-CM | POA: Insufficient documentation

## 2019-03-30 DIAGNOSIS — Z888 Allergy status to other drugs, medicaments and biological substances status: Secondary | ICD-10-CM | POA: Diagnosis not present

## 2019-03-30 DIAGNOSIS — Z950 Presence of cardiac pacemaker: Secondary | ICD-10-CM | POA: Insufficient documentation

## 2019-03-30 DIAGNOSIS — Z8249 Family history of ischemic heart disease and other diseases of the circulatory system: Secondary | ICD-10-CM | POA: Insufficient documentation

## 2019-03-30 DIAGNOSIS — Z791 Long term (current) use of non-steroidal anti-inflammatories (NSAID): Secondary | ICD-10-CM | POA: Insufficient documentation

## 2019-03-30 DIAGNOSIS — Z79899 Other long term (current) drug therapy: Secondary | ICD-10-CM | POA: Diagnosis not present

## 2019-03-30 DIAGNOSIS — I10 Essential (primary) hypertension: Secondary | ICD-10-CM | POA: Insufficient documentation

## 2019-03-30 DIAGNOSIS — Z87891 Personal history of nicotine dependence: Secondary | ICD-10-CM | POA: Insufficient documentation

## 2019-03-30 DIAGNOSIS — E0500 Thyrotoxicosis with diffuse goiter without thyrotoxic crisis or storm: Secondary | ICD-10-CM | POA: Insufficient documentation

## 2019-03-30 DIAGNOSIS — Z96651 Presence of right artificial knee joint: Secondary | ICD-10-CM | POA: Diagnosis not present

## 2019-03-30 DIAGNOSIS — E7800 Pure hypercholesterolemia, unspecified: Secondary | ICD-10-CM | POA: Insufficient documentation

## 2019-03-30 DIAGNOSIS — I495 Sick sinus syndrome: Secondary | ICD-10-CM | POA: Insufficient documentation

## 2019-03-30 DIAGNOSIS — K219 Gastro-esophageal reflux disease without esophagitis: Secondary | ICD-10-CM | POA: Insufficient documentation

## 2019-03-30 DIAGNOSIS — M1712 Unilateral primary osteoarthritis, left knee: Principal | ICD-10-CM | POA: Insufficient documentation

## 2019-03-30 DIAGNOSIS — M6281 Muscle weakness (generalized): Secondary | ICD-10-CM | POA: Insufficient documentation

## 2019-03-30 HISTORY — PX: TOTAL KNEE ARTHROPLASTY: SHX125

## 2019-03-30 SURGERY — ARTHROPLASTY, KNEE, TOTAL
Anesthesia: Spinal | Site: Knee | Laterality: Left

## 2019-03-30 MED ORDER — DIPHENHYDRAMINE HCL 12.5 MG/5ML PO ELIX: 12.5000 mg | ORAL_SOLUTION | ORAL | Status: DC | PRN

## 2019-03-30 MED ORDER — EPHEDRINE SULFATE-NACL 50-0.9 MG/10ML-% IV SOSY
PREFILLED_SYRINGE | INTRAVENOUS | Status: DC | PRN
  Administered 2019-03-30 (×2): 10 mg via INTRAVENOUS

## 2019-03-30 MED ORDER — POVIDONE-IODINE 10 % EX SWAB
2.0000 "application " | Freq: Once | CUTANEOUS | Status: AC
  Administered 2019-03-30: 2 via TOPICAL

## 2019-03-30 MED ORDER — BUPIVACAINE HCL (PF) 0.75 % IJ SOLN
INTRAMUSCULAR | Status: DC | PRN
  Administered 2019-03-30: 1.6 mL via INTRATHECAL

## 2019-03-30 MED ORDER — METOCLOPRAMIDE HCL 5 MG/ML IJ SOLN: 5.0000 mg | Freq: Three times a day (TID) | INTRAMUSCULAR | Status: DC | PRN

## 2019-03-30 MED ORDER — ATORVASTATIN CALCIUM 20 MG PO TABS: 20.0000 mg | ORAL_TABLET | Freq: Every day | ORAL | Status: DC

## 2019-03-30 MED ORDER — MENTHOL 3 MG MT LOZG: 1.0000 | LOZENGE | OROMUCOSAL | Status: DC | PRN

## 2019-03-30 MED ORDER — FENTANYL CITRATE (PF) 100 MCG/2ML IJ SOLN
50.0000 ug | Freq: Once | INTRAMUSCULAR | Status: AC
  Administered 2019-03-30 (×2): 50 ug via INTRAVENOUS
  Filled 2019-03-30: qty 2

## 2019-03-30 MED ORDER — DEXAMETHASONE SODIUM PHOSPHATE 10 MG/ML IJ SOLN
INTRAMUSCULAR | Status: AC
  Filled 2019-03-30: qty 1

## 2019-03-30 MED ORDER — DEXAMETHASONE SODIUM PHOSPHATE 10 MG/ML IJ SOLN
8.0000 mg | Freq: Once | INTRAMUSCULAR | Status: AC
  Administered 2019-03-30: 8 mg via INTRAVENOUS

## 2019-03-30 MED ORDER — ZOLPIDEM TARTRATE 5 MG PO TABS
5.0000 mg | ORAL_TABLET | Freq: Every evening | ORAL | Status: DC | PRN
  Administered 2019-03-30: 5 mg via ORAL
  Filled 2019-03-30: qty 1

## 2019-03-30 MED ORDER — ONDANSETRON HCL 4 MG/2ML IJ SOLN: 4.0000 mg | Freq: Four times a day (QID) | INTRAMUSCULAR | Status: DC | PRN

## 2019-03-30 MED ORDER — HYDROMORPHONE HCL 1 MG/ML IJ SOLN: 0.5000 mg | INTRAMUSCULAR | Status: DC | PRN

## 2019-03-30 MED ORDER — ALUM & MAG HYDROXIDE-SIMETH 200-200-20 MG/5ML PO SUSP: 30.0000 mL | ORAL | Status: DC | PRN

## 2019-03-30 MED ORDER — PROPOFOL 500 MG/50ML IV EMUL
INTRAVENOUS | Status: DC | PRN
  Administered 2019-03-30: 100 ug/kg/min via INTRAVENOUS

## 2019-03-30 MED ORDER — CEFAZOLIN SODIUM-DEXTROSE 2-4 GM/100ML-% IV SOLN
2.0000 g | INTRAVENOUS | Status: AC
  Administered 2019-03-30: 2 g via INTRAVENOUS
  Filled 2019-03-30: qty 100

## 2019-03-30 MED ORDER — SENNOSIDES-DOCUSATE SODIUM 8.6-50 MG PO TABS: 1.0000 | ORAL_TABLET | Freq: Every evening | ORAL | Status: DC | PRN

## 2019-03-30 MED ORDER — ONDANSETRON HCL 4 MG PO TABS: 4.0000 mg | ORAL_TABLET | Freq: Four times a day (QID) | ORAL | Status: DC | PRN

## 2019-03-30 MED ORDER — PHENYLEPHRINE HCL-NACL 10-0.9 MG/250ML-% IV SOLN
INTRAVENOUS | Status: DC | PRN
  Administered 2019-03-30: 40 ug/min via INTRAVENOUS

## 2019-03-30 MED ORDER — METHOCARBAMOL 500 MG PO TABS
500.0000 mg | ORAL_TABLET | Freq: Four times a day (QID) | ORAL | Status: DC | PRN
  Administered 2019-03-30 – 2019-03-31 (×2): 500 mg via ORAL
  Filled 2019-03-30 (×2): qty 1

## 2019-03-30 MED ORDER — BUPIVACAINE-EPINEPHRINE 0.25% -1:200000 IJ SOLN
INTRAMUSCULAR | Status: AC
  Filled 2019-03-30: qty 1

## 2019-03-30 MED ORDER — OXYCODONE HCL 5 MG PO TABS
5.0000 mg | ORAL_TABLET | ORAL | Status: DC | PRN
  Administered 2019-03-30 – 2019-03-31 (×2): 5 mg via ORAL
  Filled 2019-03-30 (×3): qty 1

## 2019-03-30 MED ORDER — CELECOXIB 200 MG PO CAPS
400.0000 mg | ORAL_CAPSULE | Freq: Once | ORAL | Status: AC
  Administered 2019-03-30: 400 mg via ORAL
  Filled 2019-03-30: qty 2

## 2019-03-30 MED ORDER — SODIUM CHLORIDE 0.9 % IR SOLN
Status: DC | PRN
  Administered 2019-03-30: 1000 mL

## 2019-03-30 MED ORDER — PROMETHAZINE HCL 25 MG/ML IJ SOLN: 6.2500 mg | INTRAMUSCULAR | Status: DC | PRN

## 2019-03-30 MED ORDER — METOCLOPRAMIDE HCL 5 MG PO TABS: 5.0000 mg | ORAL_TABLET | Freq: Three times a day (TID) | ORAL | Status: DC | PRN

## 2019-03-30 MED ORDER — LACTATED RINGERS IV SOLN
INTRAVENOUS | Status: DC
  Administered 2019-03-30 (×2): via INTRAVENOUS

## 2019-03-30 MED ORDER — TRANEXAMIC ACID-NACL 1000-0.7 MG/100ML-% IV SOLN
1000.0000 mg | Freq: Once | INTRAVENOUS | Status: AC
  Administered 2019-03-30: 1000 mg via INTRAVENOUS
  Filled 2019-03-30: qty 100

## 2019-03-30 MED ORDER — ONDANSETRON HCL 4 MG/2ML IJ SOLN
INTRAMUSCULAR | Status: DC | PRN
  Administered 2019-03-30: 4 mg via INTRAVENOUS

## 2019-03-30 MED ORDER — ALBUTEROL SULFATE (2.5 MG/3ML) 0.083% IN NEBU: 2.5000 mg | INHALATION_SOLUTION | Freq: Four times a day (QID) | RESPIRATORY_TRACT | Status: DC | PRN

## 2019-03-30 MED ORDER — PROPOFOL 10 MG/ML IV BOLUS
INTRAVENOUS | Status: AC
  Filled 2019-03-30: qty 20

## 2019-03-30 MED ORDER — SODIUM CHLORIDE 0.9 % IV SOLN
INTRAVENOUS | Status: DC
  Administered 2019-03-30: 15:00:00 via INTRAVENOUS

## 2019-03-30 MED ORDER — TRAMADOL HCL 50 MG PO TABS
50.0000 mg | ORAL_TABLET | Freq: Four times a day (QID) | ORAL | Status: DC
  Administered 2019-03-30 – 2019-03-31 (×3): 50 mg via ORAL
  Filled 2019-03-30 (×3): qty 1

## 2019-03-30 MED ORDER — ALPRAZOLAM 0.5 MG PO TABS
0.5000 mg | ORAL_TABLET | Freq: Two times a day (BID) | ORAL | Status: DC
  Administered 2019-03-30 – 2019-03-31 (×2): 0.5 mg via ORAL
  Filled 2019-03-30 (×2): qty 1

## 2019-03-30 MED ORDER — FENTANYL CITRATE (PF) 100 MCG/2ML IJ SOLN: 25.0000 ug | INTRAMUSCULAR | Status: DC | PRN

## 2019-03-30 MED ORDER — FERROUS SULFATE 325 (65 FE) MG PO TABS
325.0000 mg | ORAL_TABLET | Freq: Three times a day (TID) | ORAL | Status: DC
  Administered 2019-03-31: 325 mg via ORAL
  Filled 2019-03-30: qty 1

## 2019-03-30 MED ORDER — ATENOLOL 25 MG PO TABS
12.5000 mg | ORAL_TABLET | Freq: Every day | ORAL | Status: DC
  Administered 2019-03-31: 12.5 mg via ORAL
  Filled 2019-03-30: qty 1

## 2019-03-30 MED ORDER — SODIUM CHLORIDE (PF) 0.9 % IJ SOLN
INTRAMUSCULAR | Status: AC
  Filled 2019-03-30: qty 20

## 2019-03-30 MED ORDER — ONDANSETRON HCL 4 MG/2ML IJ SOLN
INTRAMUSCULAR | Status: AC
  Filled 2019-03-30: qty 2

## 2019-03-30 MED ORDER — PHENOL 1.4 % MT LIQD: 1.0000 | OROMUCOSAL | Status: DC | PRN

## 2019-03-30 MED ORDER — BUPIVACAINE LIPOSOME 1.3 % IJ SUSP
INTRAMUSCULAR | Status: DC | PRN
  Administered 2019-03-30: 20 mL

## 2019-03-30 MED ORDER — TRANEXAMIC ACID-NACL 1000-0.7 MG/100ML-% IV SOLN
1000.0000 mg | INTRAVENOUS | Status: AC
  Administered 2019-03-30: 1000 mg via INTRAVENOUS
  Filled 2019-03-30: qty 100

## 2019-03-30 MED ORDER — SODIUM CHLORIDE 0.9% FLUSH
INTRAVENOUS | Status: DC | PRN
  Administered 2019-03-30: 20 mL

## 2019-03-30 MED ORDER — DEXAMETHASONE SODIUM PHOSPHATE 10 MG/ML IJ SOLN: 10.0000 mg | Freq: Once | INTRAMUSCULAR | Status: DC

## 2019-03-30 MED ORDER — METHOCARBAMOL 500 MG IVPB - SIMPLE MED
500.0000 mg | Freq: Four times a day (QID) | INTRAVENOUS | Status: DC | PRN
  Filled 2019-03-30: qty 50

## 2019-03-30 MED ORDER — CEFAZOLIN SODIUM-DEXTROSE 2-4 GM/100ML-% IV SOLN
2.0000 g | Freq: Four times a day (QID) | INTRAVENOUS | Status: AC
  Administered 2019-03-30 (×2): 2 g via INTRAVENOUS
  Filled 2019-03-30 (×2): qty 100

## 2019-03-30 MED ORDER — BISACODYL 5 MG PO TBEC: 5.0000 mg | DELAYED_RELEASE_TABLET | Freq: Every day | ORAL | Status: DC | PRN

## 2019-03-30 MED ORDER — APIXABAN 2.5 MG PO TABS
2.5000 mg | ORAL_TABLET | Freq: Two times a day (BID) | ORAL | Status: DC
  Administered 2019-03-31: 2.5 mg via ORAL
  Filled 2019-03-30: qty 1

## 2019-03-30 MED ORDER — DOCUSATE SODIUM 100 MG PO CAPS
100.0000 mg | ORAL_CAPSULE | Freq: Two times a day (BID) | ORAL | Status: DC
  Administered 2019-03-30 – 2019-03-31 (×2): 100 mg via ORAL
  Filled 2019-03-30 (×2): qty 1

## 2019-03-30 MED ORDER — PROPOFOL 10 MG/ML IV BOLUS
INTRAVENOUS | Status: DC | PRN
  Administered 2019-03-30: 20 mg via INTRAVENOUS

## 2019-03-30 MED ORDER — PANTOPRAZOLE SODIUM 40 MG PO TBEC: 40.0000 mg | DELAYED_RELEASE_TABLET | Freq: Every day | ORAL | Status: DC

## 2019-03-30 MED ORDER — PANTOPRAZOLE SODIUM 40 MG PO TBEC
40.0000 mg | DELAYED_RELEASE_TABLET | Freq: Every day | ORAL | Status: DC
  Administered 2019-03-31: 40 mg via ORAL
  Filled 2019-03-30: qty 1

## 2019-03-30 MED ORDER — BUPIVACAINE-EPINEPHRINE 0.25% -1:200000 IJ SOLN
INTRAMUSCULAR | Status: DC | PRN
  Administered 2019-03-30: 30 mL

## 2019-03-30 MED ORDER — LEVOTHYROXINE SODIUM 50 MCG PO TABS
175.0000 ug | ORAL_TABLET | Freq: Every day | ORAL | Status: DC
  Administered 2019-03-31: 175 ug via ORAL
  Filled 2019-03-30: qty 1

## 2019-03-30 MED ORDER — MIDAZOLAM HCL 2 MG/2ML IJ SOLN
1.0000 mg | Freq: Once | INTRAMUSCULAR | Status: AC
  Administered 2019-03-30 (×2): 1 mg via INTRAVENOUS
  Filled 2019-03-30: qty 2

## 2019-03-30 MED ORDER — CHLORHEXIDINE GLUCONATE 4 % EX LIQD: 60.0000 mL | Freq: Once | CUTANEOUS | Status: DC

## 2019-03-30 MED ORDER — EPHEDRINE 5 MG/ML INJ
INTRAVENOUS | Status: AC
  Filled 2019-03-30: qty 10

## 2019-03-30 MED ORDER — FLEET ENEMA 7-19 GM/118ML RE ENEM: 1.0000 | ENEMA | Freq: Once | RECTAL | Status: DC | PRN

## 2019-03-30 SURGICAL SUPPLY — 61 items
ARTISURF 10M PLY L 6-9EF KNEE (Knees) ×2 IMPLANT
BAG SPEC THK2 15X12 ZIP CLS (MISCELLANEOUS) ×1
BAG ZIPLOCK 12X15 (MISCELLANEOUS) ×3 IMPLANT
BLADE SAGITTAL 13X1.27X60 (BLADE) ×2 IMPLANT
BLADE SAGITTAL 13X1.27X60MM (BLADE) ×1
BLADE SAW SGTL 83.5X18.5 (BLADE) ×3 IMPLANT
BLADE SURG 15 STRL LF DISP TIS (BLADE) ×1 IMPLANT
BLADE SURG 15 STRL SS (BLADE) ×3
BLADE SURG SZ10 CARB STEEL (BLADE) ×6 IMPLANT
BNDG ELASTIC 6X5.8 VLCR STR LF (GAUZE/BANDAGES/DRESSINGS) ×3 IMPLANT
BOWL SMART MIX CTS (DISPOSABLE) ×3 IMPLANT
BSPLAT TIB 5D F CMNT KN LT (Knees) ×1 IMPLANT
CEMENT BONE SIMPLEX SPEEDSET (Cement) ×6 IMPLANT
CLOSURE WOUND 1/2 X4 (GAUZE/BANDAGES/DRESSINGS) ×1
COVER SURGICAL LIGHT HANDLE (MISCELLANEOUS) ×3 IMPLANT
COVER WAND RF STERILE (DRAPES) IMPLANT
CUFF TOURN SGL QUICK 34 (TOURNIQUET CUFF) ×3
CUFF TRNQT CYL 34X4.125X (TOURNIQUET CUFF) ×1 IMPLANT
DECANTER SPIKE VIAL GLASS SM (MISCELLANEOUS) ×6 IMPLANT
DRAPE INCISE IOBAN 66X45 STRL (DRAPES) ×6 IMPLANT
DRAPE U-SHAPE 47X51 STRL (DRAPES) ×3 IMPLANT
DRSG AQUACEL AG ADV 3.5X10 (GAUZE/BANDAGES/DRESSINGS) ×3 IMPLANT
DURAPREP 26ML APPLICATOR (WOUND CARE) ×6 IMPLANT
ELECT REM PT RETURN 15FT ADLT (MISCELLANEOUS) ×3 IMPLANT
FEMUR  CMT CCR STD SZ9 L KNEE (Knees) ×2 IMPLANT
FEMUR CMT CCR STD SZ9 L KNEE (Knees) ×1 IMPLANT
FEMUR CMTD CCR STD SZ9 L KNEE (Knees) IMPLANT
GLOVE BIOGEL M STRL SZ7.5 (GLOVE) ×3 IMPLANT
GLOVE BIOGEL PI IND STRL 7.5 (GLOVE) ×1 IMPLANT
GLOVE BIOGEL PI IND STRL 8.5 (GLOVE) ×2 IMPLANT
GLOVE BIOGEL PI INDICATOR 7.5 (GLOVE) ×2
GLOVE BIOGEL PI INDICATOR 8.5 (GLOVE) ×4
GLOVE SURG ORTHO 8.0 STRL STRW (GLOVE) ×9 IMPLANT
GOWN STRL REUS W/ TWL XL LVL3 (GOWN DISPOSABLE) ×2 IMPLANT
GOWN STRL REUS W/TWL XL LVL3 (GOWN DISPOSABLE) ×6
HANDPIECE INTERPULSE COAX TIP (DISPOSABLE) ×3
HOLDER FOLEY CATH W/STRAP (MISCELLANEOUS) ×3 IMPLANT
HOOD PEEL AWAY FLYTE STAYCOOL (MISCELLANEOUS) ×9 IMPLANT
KIT TURNOVER KIT A (KITS) IMPLANT
MANIFOLD NEPTUNE II (INSTRUMENTS) ×3 IMPLANT
NEEDLE HYPO 22GX1.5 SAFETY (NEEDLE) ×3 IMPLANT
NS IRRIG 1000ML POUR BTL (IV SOLUTION) ×3 IMPLANT
PACK TOTAL KNEE CUSTOM (KITS) ×3 IMPLANT
PROTECTOR NERVE ULNAR (MISCELLANEOUS) ×3 IMPLANT
SET HNDPC FAN SPRY TIP SCT (DISPOSABLE) ×1 IMPLANT
STEM POLY PAT PLY 35M KNEE (Knees) ×2 IMPLANT
STEM TIBIA 5 DEG SZ F L KNEE (Knees) IMPLANT
STRIP CLOSURE SKIN 1/2X4 (GAUZE/BANDAGES/DRESSINGS) ×2 IMPLANT
SUT BONE WAX W31G (SUTURE) ×3 IMPLANT
SUT MNCRL AB 3-0 PS2 18 (SUTURE) ×3 IMPLANT
SUT STRATAFIX 0 PDS 27 VIOLET (SUTURE) ×3
SUT STRATAFIX PDS+ 0 24IN (SUTURE) ×3 IMPLANT
SUT VIC AB 1 CT1 36 (SUTURE) ×3 IMPLANT
SUTURE STRATFX 0 PDS 27 VIOLET (SUTURE) ×1 IMPLANT
SYR CONTROL 10ML LL (SYRINGE) ×6 IMPLANT
TIBIA STEM 5 DEG SZ F L KNEE (Knees) ×3 IMPLANT
TRAY FOLEY MTR SLVR 16FR STAT (SET/KITS/TRAYS/PACK) ×3 IMPLANT
TROCAR PINS ×2 IMPLANT
WATER STERILE IRR 1000ML POUR (IV SOLUTION) ×6 IMPLANT
WRAP KNEE MAXI GEL POST OP (GAUZE/BANDAGES/DRESSINGS) ×3 IMPLANT
YANKAUER SUCT BULB TIP 10FT TU (MISCELLANEOUS) ×3 IMPLANT

## 2019-03-30 NOTE — Evaluation (Signed)
Physical Therapy Evaluation Patient Details Name: Roy Tucker MRN: [id number] DOB: 06/13/1950 Today's Date: 03/30/2019   History of Present Illness  Patient is 68 y.o. male s/p Lt TKA on 03/30/19 with PMH significant for HTN, OA, and dysrhthmia with pacemaker.  Clinical Impression  PHILEMON RIEDESEL is a 68 y.o. male POD 0 s/p Lt TKA. Patient reports modified independence with SPC for mobility at baseline. Patient is now limited by functional impairments (see PT problem list below) and requires min assist for transfers and gait with RW. Patient was able to ambulate ~75 feet with RW and min assist to maintain safe walker management. Patient instructed in exercise to facilitate ROM and circulation. Patient will benefit from continued skilled PT interventions to address impairments and progress towards PLOF. Acute PT will follow to progress mobility and stair training in preparation for safe discharge home.    Follow Up Recommendations Follow surgeon's recommendation for DC plan and follow-up therapies    Equipment Recommendations  Rolling walker with 5" wheels    Recommendations for Other Services       Precautions / Restrictions Precautions Precautions: Fall Restrictions Weight Bearing Restrictions: No      Mobility  Bed Mobility Overal bed mobility: Needs Assistance Bed Mobility: Supine to Sit     Supine to sit: HOB elevated;Min guard     General bed mobility comments: cues for use of bed rails  Transfers Overall transfer level: Needs assistance Equipment used: Rolling walker (2 wheeled) Transfers: Sit to/from Stand Sit to Stand: Min assist         General transfer comment: cues for safe hand placement and technique for power up with RW, assist required to initiate power up and complete rise  Ambulation/Gait Ambulation/Gait assistance: Min assist Gait Distance (Feet): 75 Feet Assistive device: Rolling walker (2 wheeled) Gait Pattern/deviations: Step-through  pattern;Decreased stride length;Decreased weight shift to left;Trunk flexed Gait velocity: cues   General Gait Details: verbal cues throughout for posture, cues and assist required to maintain safe proximity to RW and seqeunce step pattern, no overt LOB noted  Stairs            Wheelchair Mobility    Modified Rankin (Stroke Patients Only)       Balance Overall balance assessment: Needs assistance Sitting-balance support: No upper extremity supported;Feet supported Sitting balance-Leahy Scale: Good     Standing balance support: During functional activity;Bilateral upper extremity supported Standing balance-Leahy Scale: Poor                Pertinent Vitals/Pain Pain Assessment: 0-10 Pain Score: 8  Pain Location: Lt knee Pain Descriptors / Indicators: Aching;Sore Pain Intervention(s): Limited activity within patient's tolerance;Monitored during session;Repositioned;Ice applied    Home Living Family/patient expects to be discharged to:: Private residence Living Arrangements: Spouse/significant other Available Help at Discharge: Family;Available 24 hours/day Type of Home: House Home Access: Stairs to enter;Ramped entrance Entrance Stairs-Rails: Right;Left Entrance Stairs-Number of Steps: pt has rails on both sides of ramp; he has been using ramp Home Layout: One level Home Equipment: Bedside commode;Cane - single point;Shower seat Additional Comments: old shower seat    Prior Function Level of Independence: Independent with assistive device(s)         Comments: pt was using cane to mobilize     Hand Dominance   Dominant Hand: Right    Extremity/Trunk Assessment   Upper Extremity Assessment Upper Extremity Assessment: Overall WFL for tasks assessed    Lower Extremity Assessment Lower Extremity Assessment: LLE deficits/detail  LLE Deficits / Details: good quad activation in supine, no extensor lag with SLR LLE Sensation: WNL LLE Coordination: WNL     Cervical / Trunk Assessment Cervical / Trunk Assessment: Normal  Communication   Communication: No difficulties  Cognition Arousal/Alertness: Awake/alert Behavior During Therapy: WFL for tasks assessed/performed Overall Cognitive Status: Within Functional Limits for tasks assessed           General Comments      Exercises Total Joint Exercises Ankle Circles/Pumps: AROM;15 reps;Seated;Both Quad Sets: AROM;10 reps;Seated;Left Heel Slides: AROM;10 reps;Seated;Left   Assessment/Plan    PT Assessment Patient needs continued PT services  PT Problem List Decreased strength;Decreased balance;Decreased range of motion;Decreased mobility;Decreased activity tolerance;Decreased knowledge of use of DME       PT Treatment Interventions DME instruction;Functional mobility training;Balance training;Modalities;Gait training;Therapeutic activities;Patient/family education;Stair training;Therapeutic exercise    PT Goals (Current goals can be found in the Care Plan section)  Acute Rehab PT Goals Patient Stated Goal: 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 PT Goal Formulation: With patient Time For Goal Achievement: 04/06/19 Potential to Achieve Goals: Good    Frequency 7X/week    AM-PAC PT "6 Clicks" Mobility  Outcome Measure Help needed turning from your back to your side while in a flat bed without using bedrails?: A Little Help needed moving from lying on your back to sitting on the side of a flat bed without using bedrails?: A Little Help needed moving to and from a bed to a chair (including a wheelchair)?: A Little Help needed standing up from a chair using your arms (e.g., wheelchair or bedside chair)?: A Little Help needed to walk in hospital room?: A Little Help needed climbing 3-5 steps with a railing? : A Little 6 Click Score: 18    End of Session Equipment Utilized During Treatment: Gait belt Activity Tolerance: Patient tolerated treatment well Patient left:  in chair;with call bell/phone within reach;with chair alarm set Nurse Communication: Mobility status PT Visit Diagnosis: Muscle weakness (generalized) (M62.81);Difficulty in walking, not elsewhere classified (R26.2)    Time: 2595-6387 PT Time Calculation (min) (ACUTE ONLY): 29 min   Charges:   PT Evaluation $PT Eval Low Complexity: 1 Low PT Treatments $Therapeutic Exercise: 8-22 mins        Valentino Saxon, PT, DPT Physical Therapist with [facility]  03/30/2019 7:19 PM

## 2019-03-30 NOTE — Anesthesia Procedure Notes (Signed)
Spinal  Patient location during procedure: OR Start time: 03/30/2019 11:42 AM End time: 03/30/2019 11:47 AM Staffing Anesthesiologist: Suzette Battiest, MD Performed: anesthesiologist  Preanesthetic Checklist Completed: patient identified, site marked, surgical consent, pre-op evaluation, timeout performed, IV checked, risks and benefits discussed and monitors and equipment checked Spinal Block Patient position: sitting Prep: DuraPrep Patient monitoring: heart rate, cardiac monitor, continuous pulse ox and blood pressure Approach: midline Location: L4-5 Injection technique: single-shot Needle Needle type: Pencan  Needle gauge: 24 G Needle length: 9 cm Assessment Sensory level: T4

## 2019-03-30 NOTE — Discharge Instructions (Signed)

## 2019-03-30 NOTE — Progress Notes (Signed)
Assisted Dr. Rob Fitzgerald with left, ultrasound guided, adductor canal block. Side rails up, monitors on throughout procedure. See vital signs in flow sheet. Tolerated Procedure well.  

## 2019-03-30 NOTE — Op Note (Signed)
TOTAL KNEE REPLACEMENT OPERATIVE NOTE:  03/30/2019  1:28 PM  PATIENT:  Roy Tucker  68 y.o. male  PRE-OPERATIVE DIAGNOSIS:  Primary Osteoarthritis Left Knee  POST-OPERATIVE DIAGNOSIS:  Primary Osteoarthritis Left Knee  PROCEDURE:  Procedure(s): TOTAL KNEE ARTHROPLASTY  SURGEON:  Surgeon(s): Dannielle Huh, MD  PHYSICIAN ASSISTANT: Laurier Nancy, PA-C  ANESTHESIA:   spinal  SPECIMEN: None  COUNTS:  Correct  TOURNIQUET:   Total Tourniquet Time Documented: Thigh (Left) - 39 minutes Total: Thigh (Left) - 39 minutes   DICTATION:  Indication for procedure:    The patient is a 67 y.o. male who has failed conservative treatment for Primary Osteoarthritis Left Knee.  Informed consent was obtained prior to anesthesia. The risks versus benefits of the operation were explain and in a way the patient can, and did, understand.     Description of procedure:     The patient was taken to the operating room and placed under anesthesia.  The patient was positioned in the usual fashion taking care that all body parts were adequately padded and/or protected.  A tourniquet was applied and the leg prepped and draped in the usual sterile fashion.  The extremity was exsanguinated with the esmarch and tourniquet inflated to 350 mmHg.  Pre-operative range of motion was normal.    A midline incision approximately 6-7 inches long was made with a #10 blade.  A new blade was used to make a parapatellar arthrotomy going 2-3 cm into the quadriceps tendon, over the patella, and alongside the medial aspect of the patellar tendon.  A synovectomy was then performed with the #10 blade and forceps. I then elevated the deep MCL off the medial tibial metaphysis subperiosteally around to the semimembranosus attachment.    I everted the patella and used calipers to measure patellar thickness.  I used the reamer to ream down to appropriate thickness to recreate the native thickness.  I then removed excess bone  with the rongeur and sagittal saw.  I used the appropriately sized template and drilled the three lug holes.  I then put the trial in place and measured the thickness with the calipers to ensure recreation of the native thickness.  The trial was then removed and the patella subluxed and the knee brought into flexion.  A homan retractor was place to retract and protect the patella and lateral structures.  A Z-retractor was place medially to protect the medial structures.  The extra-medullary alignment system was used to make cut the tibial articular surface perpendicular to the anamotic axis of the tibia and in 3 degrees of posterior slope.  The cut surface and alignment jig was removed.  I then used the intramedullary alignment guide to make a valgus cut on the distal femur.  I then marked out the epicondylar axis on the distal femur.  I then used the anterior referencing sizer and measured the femur to be a size 9.  The 4-In-1 cutting block was screwed into place in external rotation matching the posterior condylar angle, making our cuts perpendicular to the epicondylar axis.  Anterior, posterior and chamfer cuts were made with the sagittal saw.  The cutting block and cut pieces were removed.  A lamina spreader was placed in 90 degrees of flexion.  The ACL, PCL, menisci, and posterior condylar osteophytes were removed.  A 10 mm spacer blocked was found to offer good flexion and extension gap balance after minimal in degree releasing.   The scoop retractor was then placed and the femoral  finishing block was pinned in place.  The small sagittal saw was used as well as the lug drill to finish the femur.  The block and cut surfaces were removed and the medullary canal hole filled with autograft bone from the cut pieces.  The tibia was delivered forward in deep flexion and external rotation.  A size F tray was selected and pinned into place centered on the medial 1/3 of the tibial tubercle.  The reamer and keel  was used to prepare the tibia through the tray.    I then trialed with the size 9 femur, size F tibia, a 10 mm insert and the 35 patella.  I had excellent flexion/extension gap balance, excellent patella tracking.  Flexion was full and beyond 120 degrees; extension was zero.  These components were chosen and the staff opened them to me on the back table while the knee was lavaged copiously and the cement mixed.  The soft tissue was infiltrated with 60cc of exparel 1.3% through a 21 gauge needle.  I cemented in the components and removed all excess cement.  The polyethylene tibial component was snapped into place and the knee placed in extension while cement was hardening.  The capsule was infilltrated with a 60cc exparel/marcaine/saline mixture.   Once the cement was hard, the tourniquet was let down.  Hemostasis was obtained.  The arthrotomy was closed using a #1 stratofix running suture.  The deep soft tissues were closed with #0 vicryls and the subcuticular layer closed with #2-0 vicryl.  The skin was reapproximated and closed with 3.0 Monocryl.  The wound was covered with steristrips, aquacel dressing, and a TED stocking.   The patient was then awakened, extubated, and taken to the recovery room in stable condition.  BLOOD LOSS:  161WR COMPLICATIONS:  None.  PLAN OF CARE: Admit for overnight observation  PATIENT DISPOSITION:  PACU - hemodynamically stable.    Please fax a copy of this op note to my office at [phone] (please only include page 1 and 2 of the Case Information op note)

## 2019-03-30 NOTE — H&P (Signed)
Roy Tucker MRN:  [id number] DOB/SEX:  December 13, 1950/male  CHIEF COMPLAINT:  Painful left Knee  HISTORY: Patient is a 68 y.o. male presented with a history of pain in the left knee. Onset of symptoms was gradual starting a few years ago with gradually worsening course since that time. Patient has been treated conservatively with over-the-counter NSAIDs and activity modification. Patient currently rates pain in the knee at 10 out of 10 with activity. There is pain at night.  PAST MEDICAL HISTORY: Patient Active Problem List   Diagnosis Date Noted  . Hypercholesterolemia 07/12/2016  . Chest pain 04/25/2016  . Essential hypertension 12/27/2015  . Obesity, mild 12/27/2015  . SSS (sick sinus syndrome) ([place]) 06/21/2015  . Paroxysmal atrial tachycardia ([place]) 06/21/2015  . 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 Pacemaker 11/28/2012  . Hyperthyroidism 07/24/2011  . Graves' disease 07/24/2011   Past Medical History:  Diagnosis Date  . Arthritis   . Atrial tachycardia ([place])   . Bradycardia   . Chronic pain   . Dysrhythmia    bradycardia  . GERD (gastroesophageal reflux disease)   . Gout   . Hypertension   . Hyperthyroidism    graves on prednisone  . Pacemaker   . Pneumonia    few months ago  . Sinus node dysfunction ([place]) 01/12/2010   [employer] pacemaker   Past Surgical History:  Procedure Laterality Date  . COLONOSCOPY W/ POLYPECTOMY    . NM MYOCAR PERF WALL MOTION  07/05/2010   no significant ischemia  . PACEMAKER INSERTION  01/12/2010   [employer] Accent DR RF  . TOTAL KNEE ARTHROPLASTY  12/10/2011   Procedure: TOTAL KNEE ARTHROPLASTY;  Surgeon: Rudean Haskell, MD;  Location: [facility];  Service: Orthopedics;  Laterality: Right;     MEDICATIONS:   Medications Prior to Admission  Medication Sig Dispense Refill Last Dose  . albuterol (PROVENTIL HFA;VENTOLIN HFA) 108 (90 BASE) MCG/ACT inhaler Inhale 2 puffs into the lungs every 6 (six) hours as needed for wheezing or shortness of breath. 1  Inhaler 0   . ALPRAZolam (XANAX) 1 MG tablet Take 0.5 mg by mouth 2 (two) times daily.      Marland Kitchen atenolol (TENORMIN) 25 MG tablet Take 0.5 tablets (12.5 mg total) by mouth daily. 15 tablet 11   . atorvastatin (LIPITOR) 20 MG tablet Take 1 tablet by mouth daily.      . cyclobenzaprine (FLEXERIL) 10 MG tablet Take 10 mg by mouth 3 (three) times daily.     Marland Kitchen esomeprazole (NEXIUM) 40 MG capsule Take 1 capsule (40 mg total) by mouth 2 (two) times daily before a meal. (Patient taking differently: Take 20 mg by mouth daily. ) 60 capsule 1   . HYDROcodone-acetaminophen (NORCO) 10-325 MG per tablet Take 1 tablet by mouth every 6 (six) hours as needed for moderate pain.      Marland Kitchen levothyroxine (SYNTHROID, LEVOTHROID) 175 MCG tablet Take 175 mcg by mouth daily before breakfast.     . loratadine (CLARITIN) 10 MG tablet Take 10 mg by mouth daily.  5   . meloxicam (MOBIC) 15 MG tablet Take 15 mg by mouth daily.      Marland Kitchen zolpidem (AMBIEN) 5 MG tablet Take 5 mg by mouth at bedtime.       ALLERGIES:   Allergies  Allergen Reactions  . Infliximab Other (See Comments)    UNKNOWN  . Methimazole Nausea Only    unsure  . Neurontin [Gabapentin] Itching  . Aspirin Rash  . Tylenol [Acetaminophen]  Rash    REVIEW OF SYSTEMS:  A comprehensive review of systems was negative except for: Musculoskeletal: positive for arthralgias and bone pain   FAMILY HISTORY:   Family History  Problem Relation Age of Onset  . Cancer Father        unknown type of cancer  . Heart disease Brother        h/o pacemaker  . Heart failure Other     SOCIAL HISTORY:   Social History   Tobacco Use  . Smoking status: Former Smoker    Packs/day: 1.00    Years: 15.00    Pack years: 15.00    Types: Cigarettes    Quit date: 12/03/2003    Years since quitting: 15.3  . Smokeless tobacco: Never Used  Substance Use Topics  . Alcohol use: No     EXAMINATION:  Vital signs in last 24 hours:    There were no vitals taken for this  visit.  General Appearance:    Alert, cooperative, no distress, appears stated age  Head:    Normocephalic, without obvious abnormality, atraumatic  Eyes:    PERRL, conjunctiva/corneas clear, EOM's intact, fundi    benign, both eyes       Ears:    Normal TM's and external ear canals, both ears  Nose:   Nares normal, septum midline, mucosa normal, no drainage    or sinus tenderness  Throat:   Lips, mucosa, and tongue normal; teeth and gums normal  Neck:   Supple, symmetrical, trachea midline, no adenopathy;       thyroid:  No enlargement/tenderness/nodules; no carotid   bruit or JVD  Back:     Symmetric, no curvature, ROM normal, no CVA tenderness  Lungs:     Clear to auscultation bilaterally, respirations unlabored  Chest wall:    No tenderness or deformity  Heart:    Regular rate and rhythm, S1 and S2 normal, no murmur, rub   or gallop  Abdomen:     Soft, non-tender, bowel sounds active all four quadrants,    no masses, no organomegaly  Genitalia:    Normal male without lesion, discharge or tenderness  Rectal:    Normal tone, normal prostate, no masses or tenderness;   guaiac negative stool  Extremities:   Extremities normal, atraumatic, no cyanosis or edema  Pulses:   2+ and symmetric all extremities  Skin:   Skin color, texture, turgor normal, no rashes or lesions  Lymph nodes:   Cervical, supraclavicular, and axillary nodes normal  Neurologic:   CNII-XII intact. Normal strength, sensation and reflexes      throughout    Musculoskeletal:  ROM 0-120, Ligaments intact,  Imaging Review Plain radiographs demonstrate severe degenerative joint disease of the left knee. The overall alignment is neutral. The bone quality appears to be good for age and reported activity level.  Assessment/Plan: Primary osteoarthritis, left knee   The patient history, physical examination and imaging studies are consistent with advanced degenerative joint disease of the left knee. The patient has failed  conservative treatment.  The clearance notes were reviewed.  After discussion with the patient it was felt that Total Knee Replacement was indicated. The procedure,  risks, and benefits of total knee arthroplasty were presented and reviewed. The risks including but not limited to aseptic loosening, infection, blood clots, vascular injury, stiffness, patella tracking problems complications among others were discussed. The patient acknowledged the explanation, agreed to proceed with the plan.  Preoperative templating of the joint replacement  has been completed, documented, and submitted to the Operating Room personnel in order to optimize intra-operative equipment management.    Patient's anticipated LOS is less than 2 midnights, meeting these requirements: - Younger than 35 - Lives within 1 hour of care - Has a competent adult at home to recover with post-op recover - NO history of  - Chronic pain requiring opiods  - Diabetes  - Coronary Artery Disease  - Heart failure  - Heart attack  - Stroke  - DVT/VTE  - Cardiac arrhythmia  - Respiratory Failure/COPD  - Renal failure  - Anemia  - Advanced Liver disease       Guy Sandifer 03/30/2019, 8:48 AM

## 2019-03-30 NOTE — Anesthesia Procedure Notes (Signed)
Anesthesia Regional Block: Adductor canal block   Pre-Anesthetic Checklist: ,, timeout performed, Correct Patient, Correct Site, Correct Laterality, Correct Procedure, Correct Position, site marked, Risks and benefits discussed,  Surgical consent,  Pre-op evaluation,  At surgeon's request and post-op pain management  Laterality: Left  Prep: chloraprep       Needles:  Injection technique: Single-shot  Needle Type: Echogenic Needle     Needle Length: 9cm  Needle Gauge: 21     Additional Needles:   Procedures:,,,, ultrasound used (permanent image in chart),,,,  Narrative:  Start time: 03/30/2019 11:20 AM End time: 03/30/2019 11:25 AM Injection made incrementally with aspirations every 5 mL.  Performed by: Personally  Anesthesiologist: Suzette Battiest, MD

## 2019-03-30 NOTE — Anesthesia Preprocedure Evaluation (Signed)
Anesthesia Evaluation  Patient identified by MRN, date of birth, ID band Patient awake    Reviewed: Allergy & Precautions, NPO status , Patient's Chart, lab work & pertinent test results  Airway Mallampati: II  TM Distance: >3 FB     Dental  (+) Dental Advisory Given   Pulmonary former smoker,    breath sounds clear to auscultation       Cardiovascular hypertension, Pt. on medications and Pt. on home beta blockers + dysrhythmias (SSS, PAT) + pacemaker  Rhythm:Regular Rate:Normal     Neuro/Psych negative neurological ROS     GI/Hepatic Neg liver ROS, GERD  ,  Endo/Other  Hypothyroidism   Renal/GU negative Renal ROS     Musculoskeletal  (+) Arthritis ,   Abdominal   Peds  Hematology negative hematology ROS (+)   Anesthesia Other Findings   Reproductive/Obstetrics                             Lab Results  Component Value Date   WBC 4.0 03/27/2019   HGB 14.0 03/27/2019   HCT 42.9 03/27/2019   MCV 88.6 03/27/2019   PLT 116 (L) 03/27/2019   Lab Results  Component Value Date   CREATININE 0.94 03/27/2019   BUN 13 03/27/2019   NA 141 03/27/2019   K 5.0 03/27/2019   CL 105 03/27/2019   CO2 27 03/27/2019    Anesthesia Physical Anesthesia Plan  ASA: II  Anesthesia Plan: Spinal   Post-op Pain Management:  Regional for Post-op pain   Induction: Intravenous  PONV Risk Score and Plan: 1 and Propofol infusion, Ondansetron and Treatment may vary due to age or medical condition  Airway Management Planned: Natural Airway and Simple Face Mask  Additional Equipment:   Intra-op Plan:   Post-operative Plan:   Informed Consent: I have reviewed the patients History and Physical, chart, labs and discussed the procedure including the risks, benefits and alternatives for the proposed anesthesia with the patient or authorized representative who has indicated his/her understanding and  acceptance.       Plan Discussed with: CRNA  Anesthesia Plan Comments:         Anesthesia Quick Evaluation

## 2019-03-30 NOTE — Transfer of Care (Signed)
Immediate Anesthesia Transfer of Care Note  Patient: Roy Tucker  Procedure(s) Performed: TOTAL KNEE ARTHROPLASTY (Left Knee)  Patient Location: PACU  Anesthesia Type:Spinal  Level of Consciousness: awake, alert , oriented and patient cooperative  Airway & Oxygen Therapy: Patient Spontanous Breathing and Patient connected to face mask  Post-op Assessment: Report given to RN and Post -op Vital signs reviewed and stable  Post vital signs: Reviewed and stable  Last Vitals:  Vitals Value Taken Time  BP    Temp    Pulse 59 03/30/19 1316  Resp 11 03/30/19 1316  SpO2 100 % 03/30/19 1316  Vitals shown include unvalidated device data.  Last Pain:  Vitals:   03/30/19 1130  TempSrc:   PainSc: 0-No pain         Complications: No apparent anesthesia complications

## 2019-03-31 ENCOUNTER — Encounter (HOSPITAL_COMMUNITY): Payer: Self-pay | Admitting: Orthopedic Surgery

## 2019-03-31 DIAGNOSIS — M1712 Unilateral primary osteoarthritis, left knee: Secondary | ICD-10-CM | POA: Diagnosis not present

## 2019-03-31 LAB — CBC
HCT: 35.6 % — ABNORMAL LOW (ref 39.0–52.0)
Hemoglobin: 11.7 g/dL — ABNORMAL LOW (ref 13.0–17.0)
MCH: 29.3 pg (ref 26.0–34.0)
MCHC: 32.9 g/dL (ref 30.0–36.0)
MCV: 89 fL (ref 80.0–100.0)
Platelets: 115 10*3/uL — ABNORMAL LOW (ref 150–400)
RBC: 4 MIL/uL — ABNORMAL LOW (ref 4.22–5.81)
RDW: 12.8 % (ref 11.5–15.5)
WBC: 7.8 10*3/uL (ref 4.0–10.5)
nRBC: 0 % (ref 0.0–0.2)

## 2019-03-31 LAB — BASIC METABOLIC PANEL
Anion gap: 6 (ref 5–15)
BUN: 17 mg/dL (ref 8–23)
CO2: 25 mmol/L (ref 22–32)
Calcium: 8.9 mg/dL (ref 8.9–10.3)
Chloride: 107 mmol/L (ref 98–111)
Creatinine, Ser: 0.93 mg/dL (ref 0.61–1.24)
GFR calc Af Amer: >60 mL/min (ref >60)
GFR calc non Af Amer: >60 mL/min (ref >60)
Glucose, Bld: 128 mg/dL — ABNORMAL HIGH (ref 70–99)
Potassium: 4.6 mmol/L (ref 3.5–5.1)
Sodium: 138 mmol/L (ref 135–145)

## 2019-03-31 MED ORDER — METHOCARBAMOL 500 MG PO TABS: 500.0000 mg | ORAL_TABLET | Freq: Four times a day (QID) | ORAL | 0 refills | Status: DC | PRN

## 2019-03-31 MED ORDER — APIXABAN 2.5 MG PO TABS: 2.5000 mg | ORAL_TABLET | Freq: Two times a day (BID) | ORAL | 0 refills | Status: DC

## 2019-03-31 MED ORDER — OXYCODONE HCL 5 MG PO TABS: 5.0000 mg | ORAL_TABLET | Freq: Four times a day (QID) | ORAL | 0 refills | Status: DC | PRN

## 2019-03-31 NOTE — Plan of Care (Signed)
Patient discharged home in stable condition. He is waiting on a walker to be delivered

## 2019-03-31 NOTE — Anesthesia Postprocedure Evaluation (Signed)
Anesthesia Post Note  Patient: Roy Tucker  Procedure(s) Performed: TOTAL KNEE ARTHROPLASTY (Left Knee)     Patient location during evaluation: PACU Anesthesia Type: Spinal Level of consciousness: awake and alert Pain management: pain level controlled Vital Signs Assessment: post-procedure vital signs reviewed and stable Respiratory status: spontaneous breathing and respiratory function stable Cardiovascular status: blood pressure returned to baseline and stable Postop Assessment: spinal receding Anesthetic complications: no    Last Vitals:  Vitals:   03/31/19 0434 03/31/19 1005  BP: 118/70 133/76  Pulse: 71 69  Resp: 14 16  Temp: 36.6 C 37.1 C  SpO2: 99% 98%    Last Pain:  Vitals:   03/31/19 1005  TempSrc: Oral  PainSc:                  Tiajuana Amass

## 2019-03-31 NOTE — Progress Notes (Signed)
Physical Therapy Treatment Patient Details Name: Roy Tucker MRN: [id number] DOB: [birth year] Today's Date: 03/31/2019    History of Present Illness Patient is 68 y.o. male s/p Lt TKA on 03/30/19 with PMH significant for HTN, OA, and dysrhthmia with pacemaker.    PT Comments    POD # 1 am session Assisted OOB.  General bed mobility comments: pt self able.  Assisted with amb.  General Gait Details: <25% VC's on safety with turns and proper upright posture.  Instructed to use walker vs cane when D/C to home. Then returned to room to perform some TE's following HEP handout.  Instructed on proper tech, freq as well as use of ICE.   Instructed on Zero Block.  Addressed all mobility questions, discussed appropriate activity, educated on use of ICE.  Pt ready for D/C to home.  Pt has had TKR before and very knowledgeable.  Plans to D/C to home with spouse.  "I'm ready to go".   Follow Up Recommendations  Follow surgeon's recommendation for DC plan and follow-up therapies;Outpatient PT     Equipment Recommendations  Rolling walker with 5" wheels    Recommendations for Other Services       Precautions / Restrictions Precautions Precautions: Fall Restrictions Weight Bearing Restrictions: No    Mobility  Bed Mobility Overal bed mobility: Needs Assistance Bed Mobility: Supine to Sit     Supine to sit: Supervision     General bed mobility comments: pt self able  Transfers Overall transfer level: Needs assistance Equipment used: Rolling walker (2 wheeled) Transfers: Sit to/from [facility] Sit to Stand: Supervision Stand pivot transfers: Supervision;Min guard       General transfer comment: < 25% VC'as on safety with turns and hand placement with sit to stand as well as to extend L LE to decrease pain  Ambulation/Gait Ambulation/Gait assistance: Supervision;Min guard Gait Distance (Feet): 115 Feet Assistive device: Rolling walker (2 wheeled) Gait  Pattern/deviations: Step-through pattern;Decreased stride length;Decreased weight shift to left;Trunk flexed Gait velocity: decreased   General Gait Details: <25% VC's on safety with turns and proper upright posture.  Instructed to use walker vs cane when D/C to home.   Stairs    No stairs Has a RAMP         Wheelchair Mobility    Modified Rankin (Stroke Patients Only)       Balance                                            Cognition Arousal/Alertness: Awake/alert Behavior During Therapy: WFL for tasks assessed/performed Overall Cognitive Status: Within Functional Limits for tasks assessed                                 General Comments: had knee surgery before      Exercises      General Comments        Pertinent Vitals/Pain Pain Assessment: 0-10 Pain Score: 3  Pain Location: Lt knee with activity Pain Descriptors / Indicators: Aching;Sore;Operative site guarding Pain Intervention(s): Monitored during session;Premedicated before session;Repositioned;Ice applied    Home Living                      Prior Function            PT Goals (  current goals can now be found in the care plan section) Progress towards PT goals: Progressing toward goals    Frequency    7X/week      PT Plan Current plan remains appropriate    Co-evaluation              AM-PAC PT "6 Clicks" Mobility   Outcome Measure  Help needed turning from your back to your side while in a flat bed without using bedrails?: None Help needed moving from lying on your back to sitting on the side of a flat bed without using bedrails?: None Help needed moving to and from a bed to a chair (including a wheelchair)?: None Help needed standing up from a chair using your arms (e.g., wheelchair or bedside chair)?: A Little Help needed to walk in hospital room?: A Little Help needed climbing 3-5 steps with a railing? : A Little 6 Click Score:  21    End of Session Equipment Utilized During Treatment: Gait belt Activity Tolerance: Patient tolerated treatment well Patient left: in chair;with call bell/phone within reach;with chair alarm set Nurse Communication: Mobility status(pt ready for D/C one PT session only needed) PT Visit Diagnosis: Muscle weakness (generalized) (M62.81);Difficulty in walking, not elsewhere classified (R26.2)     Time: 4650-3546 PT Time Calculation (min) (ACUTE ONLY): 25 min  Charges:  $Gait Training: 8-22 mins $Therapeutic Exercise: 8-22 mins                     Felecia Shelling  PTA Acute  Rehabilitation Services Pager      [phone] Office      [phone]

## 2019-03-31 NOTE — TOC Transition Note (Signed)
Transition of Care [facility]) - CM/SW Discharge Note   Patient Details  Name: Roy Tucker MRN: [id number] Date of Birth: 01-07-1951  [facility] CM/SW Contact:  Lia Hopping, [place] Phone Number: 03/31/2019, 11:08 AM   Clinical Narrative:    Therapy Plan: CSW confirmed therapy plan with Practice Manager Myer Haff  RW ordered and delivered by [employer].  Patient aware of the plan.    Final next level of care: OP Rehab Barriers to Discharge: No Barriers Identified   Patient Goals and CMS Choice        Discharge Placement                       Discharge Plan and Services                DME Arranged: Walker rolling   Date DME Agency Contacted: 03/31/19 Time DME Agency Contacted: 1108 Representative spoke with at DME Agency: [place] Interventions     Readmission Risk Interventions No flowsheet data found.

## 2019-03-31 NOTE — Progress Notes (Signed)
SPORTS MEDICINE AND JOINT REPLACEMENT  [name], MD    [name], PA-C [address], [place], [place]  [address]                             [phone]   PROGRESS NOTE  Subjective:  negative for Chest Pain  negative for Shortness of Breath  negative for Nausea/Vomiting   negative for Calf Pain  negative for Bowel Movement   Tolerating Diet: yes         Patient reports pain as 4 on 0-10 scale.    Objective: Vital signs in last 24 hours:    Patient Vitals for the past 24 hrs:  BP Temp Temp src Pulse Resp SpO2 Height Weight  03/31/19 0434 118/70 97.8 F (36.6 C) Oral 71 14 99 % - -  03/31/19 0103 100/70 (!) 97.4 F (36.3 C) Oral 70 14 97 % - -  03/30/19 2100 117/65 97.6 F (36.4 C) Oral 66 14 94 % - -  03/30/19 1855 118/77 97.6 F (36.4 C) Oral 62 16 95 % - -  03/30/19 1800 136/73 98.1 F (36.7 C) Oral 60 16 98 % - -  03/30/19 1703 117/67 98.1 F (36.7 C) Oral 63 16 100 % - -  03/30/19 1454 112/69 97.6 F (36.4 C) Oral 66 16 100 % 5\' 10"  (1.778 m) 95.3 kg  03/30/19 1415 105/63 - - 60 (!) 9 100 % - -  03/30/19 1400 106/61 - - 60 10 100 % - -  03/30/19 1345 104/66 - - 60 (!) 9 100 % - -  03/30/19 1330 108/65 - - 60 13 100 % - -  03/30/19 1315 103/62 (!) 97.5 F (36.4 C) - 65 11 100 % - -  03/30/19 1130 127/69 - - 60 16 100 % - -  03/30/19 1125 - - - 60 14 100 % - -  03/30/19 1120 - - - 60 17 100 % - -  03/30/19 0912 (!) 142/69 97.9 F (36.6 C) Oral 62 12 100 % - -    @flow {1959:LAST@   Intake/Output from previous day:   11/16 0701 - 11/17 0700 In: 2867.3 [P.O.:660; I.V.:2074.6] Out: 2545 [Urine:2525]   Intake/Output this shift:   No intake/output data recorded.   Intake/Output      11/16 0701 - 11/17 0700 11/17 0701 - 11/18 0700   P.O. 660    I.V. (mL/kg) 2074.6 (21.8)    IV Piggyback 132.7    Total Intake(mL/kg) 2867.3 (30.1)    Urine (mL/kg/hr) 2525    Stool 0    Blood 20    Total Output 2545    Net +322.3         Stool Occurrence  0 x       LABORATORY DATA: Recent Labs    03/27/19 1353 03/31/19 0317  WBC 4.0 7.8  HGB 14.0 11.7*  HCT 42.9 35.6*  PLT 116* 115*   Recent Labs    03/27/19 1353 03/31/19 0317  NA 141 138  K 5.0 4.6  CL 105 107  CO2 27 25  BUN 13 17  CREATININE 0.94 0.93  GLUCOSE 96 128*  CALCIUM 9.6 8.9   Lab Results  Component Value Date   INR 0.92 12/03/2011   INR 0.95 01/12/2010   INR 0.95 01/08/2010    Examination:  General appearance: alert, cooperative and no distress Extremities: extremities normal, atraumatic, no cyanosis or edema  Wound Exam: clean, dry, intact   Drainage:  None: wound tissue dry  Motor Exam: Quadriceps and Hamstrings Intact  Sensory Exam: Superficial Peroneal, Deep Peroneal and Tibial normal   Assessment:    1 Day Post-Op  Procedure(s) (LRB): TOTAL KNEE ARTHROPLASTY (Left)  ADDITIONAL DIAGNOSIS:  Active Problems:   S/P total knee replacement     Plan: Physical Therapy as ordered Weight Bearing as Tolerated (WBAT)  DVT Prophylaxis:  Aspirin  DISCHARGE PLAN: Home    patient doing well, expect D/C today    Patient's anticipated LOS is less than 2 midnights, meeting these requirements: - Lives within 1 hour of care - Has a competent adult at home to recover with post-op recover - NO history of  - Chronic pain requiring opiods  - Diabetes  - Coronary Artery Disease  - Heart failure  - Heart attack  - Stroke  - DVT/VTE  - Cardiac arrhythmia  - Respiratory Failure/COPD  - Renal failure  - Anemia  - Advanced Liver disease        Guy Sandifer 03/31/2019, 7:49 AM

## 2019-04-08 ENCOUNTER — Encounter: Payer: Medicare Other | Admitting: Cardiovascular Disease

## 2019-04-09 LAB — CUP PACEART REMOTE DEVICE CHECK
Battery Remaining Longevity: 1 mo
Battery Remaining Percentage: 1 %
Battery Voltage: 2.63 V
Brady Statistic AP VP Percent: 13 %
Brady Statistic AP VS Percent: 87 %
Brady Statistic AS VP Percent: 1 %
Brady Statistic AS VS Percent: 1 %
Brady Statistic RA Percent Paced: 99 %
Brady Statistic RV Percent Paced: 13 %
Date Time Interrogation Session: 20201126020012
Implantable Lead Implant Date: 20110901
Implantable Lead Implant Date: 20110901
Implantable Lead Location: 753859
Implantable Lead Location: 753860
Implantable Pulse Generator Implant Date: 20110901
Lead Channel Impedance Value: 350 Ohm
Lead Channel Impedance Value: 410 Ohm
Lead Channel Pacing Threshold Amplitude: 0.625 V
Lead Channel Pacing Threshold Amplitude: 1.625 V
Lead Channel Pacing Threshold Pulse Width: 0.4 ms
Lead Channel Pacing Threshold Pulse Width: 0.8 ms
Lead Channel Sensing Intrinsic Amplitude: 2.6 mV
Lead Channel Sensing Intrinsic Amplitude: 8.2 mV
Lead Channel Setting Pacing Amplitude: 1.625
Lead Channel Setting Pacing Amplitude: 1.875
Lead Channel Setting Pacing Pulse Width: 0.8 ms
Lead Channel Setting Sensing Sensitivity: 2 mV
Pulse Gen Model: 2210
Pulse Gen Serial Number: 7168017

## 2019-04-14 NOTE — Discharge Summary (Signed)
SPORTS MEDICINE & JOINT REPLACEMENT   [name], MD   [name], PA-C [address], [place], [place]  [id number]                             [phone]  PATIENT ID: Roy Tucker        MRN:  [id number]          DOB/AGE: 1950/12/13 / 68 y.o.    DISCHARGE SUMMARY  ADMISSION DATE:    03/30/2019 DISCHARGE DATE:   03/31/2019   ADMISSION DIAGNOSIS: Primary Osteoarthritis Left Knee    DISCHARGE DIAGNOSIS:  Primary Osteoarthritis Left Knee    ADDITIONAL DIAGNOSIS: Active Problems:   S/P total knee replacement  Past Medical History:  Diagnosis Date  . Arthritis   . Atrial tachycardia ([place])   . Bradycardia   . Chronic pain   . Dysrhythmia    bradycardia  . GERD (gastroesophageal reflux disease)   . Gout   . Hypertension   . Hyperthyroidism    graves on prednisone  . Pacemaker   . Pneumonia    few months ago  . Sinus node dysfunction ([place]) 01/12/2010   [employer] pacemaker    PROCEDURE: Procedure(s): TOTAL KNEE ARTHROPLASTY on 03/30/2019  CONSULTS:    HISTORY:  See H&P in chart  HOSPITAL COURSE:  Roy Tucker is a 68 y.o. admitted on 03/30/2019 and found to have a diagnosis of Primary Osteoarthritis Left Knee.  After appropriate laboratory studies were obtained  they were taken to the operating room on 03/30/2019 and underwent Procedure(s): TOTAL KNEE ARTHROPLASTY.   They were given perioperative antibiotics:  Anti-infectives (From admission, onward)   Start     Dose/Rate Route Frequency Ordered Stop   03/30/19 1800  ceFAZolin (ANCEF) IVPB 2g/100 mL premix     2 g 200 mL/hr over 30 Minutes Intravenous Every 6 hours 03/30/19 1448 03/30/19 2355   03/30/19 0900  ceFAZolin (ANCEF) IVPB 2g/100 mL premix     2 g 200 mL/hr over 30 Minutes Intravenous On call to O.R. 03/30/19 5329 03/30/19 1201    .  Patient given tranexamic acid IV or topical and exparel intra-operatively.  Tolerated the procedure well.    POD# 1: Vital signs were stable.   Patient denied Chest pain, shortness of breath, or calf pain.  Patient was started on Aspirin twice daily at 8am.  Consults to PT, OT, and care management were made.  The patient was weight bearing as tolerated.  CPM was placed on the operative leg 0-90 degrees for 6-8 hours a day. When out of the CPM, patient was placed in the foam block to achieve full extension. Incentive spirometry was taught.  Dressing was changed.       POD #2, Continued  PT for ambulation and exercise program.  IV saline locked.  O2 discontinued.    The remainder of the hospital course was dedicated to ambulation and strengthening.   The patient was discharged on 1 day post op in  Good condition.  Blood products given:none  DIAGNOSTIC STUDIES: Recent vital signs: No data found.     Recent laboratory studies: No results for input(s): WBC, HGB, HCT, PLT in the last 168 hours. No results for input(s): NA, K, CL, CO2, BUN, CREATININE, GLUCOSE, CALCIUM in the last 168 hours. Lab Results  Component Value Date   INR 0.92 12/03/2011   INR 0.95 01/12/2010   INR 0.95 01/08/2010  Recent Radiographic Studies :  No results found.  DISCHARGE INSTRUCTIONS: Discharge Instructions    Call MD / Call 911   Complete by: As directed    If you experience chest pain or shortness of breath, CALL 911 and be transported to the hospital emergency room.  If you develope a fever above 101 F, pus (white drainage) or increased drainage or redness at the wound, or calf pain, call your surgeon's office.   Constipation Prevention   Complete by: As directed    Drink plenty of fluids.  Prune juice may be helpful.  You may use a stool softener, such as Colace (over the counter) 100 mg twice a day.  Use MiraLax (over the counter) for constipation as needed.   Diet - low sodium heart healthy   Complete by: As directed    Discharge instructions   Complete by: As directed    INSTRUCTIONS AFTER JOINT REPLACEMENT   Remove items at home which  could result in a fall. This includes throw rugs or furniture in walking pathways ICE to the affected joint every three hours while awake for 30 minutes at a time, for at least the first 3-5 days, and then as needed for pain and swelling.  Continue to use ice for pain and swelling. You may notice swelling that will progress down to the foot and ankle.  This is normal after surgery.  Elevate your leg when you are not up walking on it.   Continue to use the breathing machine you got in the hospital (incentive spirometer) which will help keep your temperature down.  It is common for your temperature to cycle up and down following surgery, especially at night when you are not up moving around and exerting yourself.  The breathing machine keeps your lungs expanded and your temperature down.   DIET:  As you were doing prior to hospitalization, we recommend a well-balanced diet.  DRESSING / WOUND CARE / SHOWERING  Keep the surgical dressing until follow up.  The dressing is water proof, so you can shower without any extra covering.  IF THE DRESSING FALLS OFF or the wound gets wet inside, change the dressing with sterile gauze.  Please use good hand washing techniques before changing the dressing.  Do not use any lotions or creams on the incision until instructed by your surgeon.    ACTIVITY  Increase activity slowly as tolerated, but follow the weight bearing instructions below.   No driving for 6 weeks or until further direction given by your physician.  You cannot drive while taking narcotics.  No lifting or carrying greater than 10 lbs. until further directed by your surgeon. Avoid periods of inactivity such as sitting longer than an hour when not asleep. This helps prevent blood clots.  You may return to work once you are authorized by your doctor.     WEIGHT BEARING   Weight bearing as tolerated with assist device (walker, cane, etc) as directed, use it as long as suggested by your surgeon or  therapist, typically at least 4-6 weeks.   EXERCISES  Results after joint replacement surgery are often greatly improved when you follow the exercise, range of motion and muscle strengthening exercises prescribed by your doctor. Safety measures are also important to protect the joint from further injury. Any time any of these exercises cause you to have increased pain or swelling, decrease what you are doing until you are comfortable again and then slowly increase them. If you have problems  or questions, call your caregiver or physical therapist for advice.   Rehabilitation is important following a joint replacement. After just a few days of immobilization, the muscles of the leg can become weakened and shrink (atrophy).  These exercises are designed to build up the tone and strength of the thigh and leg muscles and to improve motion. Often times heat used for twenty to thirty minutes before working out will loosen up your tissues and help with improving the range of motion but do not use heat for the first two weeks following surgery (sometimes heat can increase post-operative swelling).   These exercises can be done on a training (exercise) mat, on the floor, on a table or on a bed. Use whatever works the best and is most comfortable for you.    Use music or television while you are exercising so that the exercises are a pleasant break in your day. This will make your life better with the exercises acting as a break in your routine that you can look forward to.   Perform all exercises about fifteen times, three times per day or as directed.  You should exercise both the operative leg and the other leg as well.   Exercises include:   Quad Sets - Tighten up the muscle on the front of the thigh (Quad) and hold for 5-10 seconds.   Straight Leg Raises - With your knee straight (if you were given a brace, keep it on), lift the leg to 60 degrees, hold for 3 seconds, and slowly lower the leg.  Perform this  exercise against resistance later as your leg gets stronger.  Leg Slides: Lying on your back, slowly slide your foot toward your buttocks, bending your knee up off the floor (only go as far as is comfortable). Then slowly slide your foot back down until your leg is flat on the floor again.  Angel Wings: Lying on your back spread your legs to the side as far apart as you can without causing discomfort.  Hamstring Strength:  Lying on your back, push your heel against the floor with your leg straight by tightening up the muscles of your buttocks.  Repeat, but this time bend your knee to a comfortable angle, and push your heel against the floor.  You may put a pillow under the heel to make it more comfortable if necessary.   A rehabilitation program following joint replacement surgery can speed recovery and prevent re-injury in the future due to weakened muscles. Contact your doctor or a physical therapist for more information on knee rehabilitation.    CONSTIPATION  Constipation is defined medically as fewer than three stools per week and severe constipation as less than one stool per week.  Even if you have a regular bowel pattern at home, your normal regimen is likely to be disrupted due to multiple reasons following surgery.  Combination of anesthesia, postoperative narcotics, change in appetite and fluid intake all can affect your bowels.   YOU MUST use at least one of the following options; they are listed in order of increasing strength to get the job done.  They are all available over the counter, and you may need to use some, POSSIBLY even all of these options:    Drink plenty of fluids (prune juice may be helpful) and high fiber foods Colace 100 mg by mouth twice a day  Senokot for constipation as directed and as needed Dulcolax (bisacodyl), take with full glass of water  Miralax (polyethylene  glycol) once or twice a day as needed.  If you have tried all these things and are unable to have a  bowel movement in the first 3-4 days after surgery call either your surgeon or your primary doctor.    If you experience loose stools or diarrhea, hold the medications until you stool forms back up.  If your symptoms do not get better within 1 week or if they get worse, check with your doctor.  If you experience "the worst abdominal pain ever" or develop nausea or vomiting, please contact the office immediately for further recommendations for treatment.   ITCHING:  If you experience itching with your medications, try taking only a single pain pill, or even half a pain pill at a time.  You can also use Benadryl over the counter for itching or also to help with sleep.   TED HOSE STOCKINGS:  Use stockings on both legs until for at least 2 weeks or as directed by physician office. They may be removed at night for sleeping.  MEDICATIONS:  See your medication summary on the "After Visit Summary" that nursing will review with you.  You may have some home medications which will be placed on hold until you complete the course of blood thinner medication.  It is important for you to complete the blood thinner medication as prescribed.  PRECAUTIONS:  If you experience chest pain or shortness of breath - call 911 immediately for transfer to the hospital emergency department.   If you develop a fever greater that 101 F, purulent drainage from wound, increased redness or drainage from wound, foul odor from the wound/dressing, or calf pain - CONTACT YOUR SURGEON.                                                   FOLLOW-UP APPOINTMENTS:  If you do not already have a post-op appointment, please call the office for an appointment to be seen by your surgeon.  Guidelines for how soon to be seen are listed in your "After Visit Summary", but are typically between 1-4 weeks after surgery.  OTHER INSTRUCTIONS:   Knee Replacement:  Do not place pillow under knee, focus on keeping the knee straight while resting. CPM  instructions: 0-90 degrees, 2 hours in the morning, 2 hours in the afternoon, and 2 hours in the evening. Place foam block, curve side up under heel at all times except when in CPM or when walking.  DO NOT modify, tear, cut, or change the foam block in any way.  MAKE SURE YOU:  Understand these instructions.  Get help right away if you are not doing well or get worse.    Thank you for letting us be a part of your medical care team.  It is a privilege we respect greatly.  We hope these instructions will help you stay on track for a fast and full recovery!   Increase activity slowly as tolerated   Complete by: As directed       DISCHARGE MEDICATIONS:   Allergies as of 03/31/2019      Reactions   Infliximab Other (See Comments)   UNKNOWN   Methimazole Nausea Only   unsure   Neurontin [gabapentin] Itching   Aspirin Rash   Tylenol [acetaminophen] Rash      Medication List    STOP taking  these medications   cyclobenzaprine 10 MG tablet Commonly known as: FLEXERIL   HYDROcodone-acetaminophen 10-325 MG tablet Commonly known as: NORCO   meloxicam 15 MG tablet Commonly known as: MOBIC     TAKE these medications   albuterol 108 (90 Base) MCG/ACT inhaler Commonly known as: VENTOLIN HFA Inhale 2 puffs into the lungs every 6 (six) hours as needed for wheezing or shortness of breath.   ALPRAZolam 1 MG tablet Commonly known as: XANAX Take 0.5 mg by mouth 2 (two) times daily.   apixaban 2.5 MG Tabs tablet Commonly known as: ELIQUIS Take 1 tablet (2.5 mg total) by mouth every 12 (twelve) hours.   atenolol 25 MG tablet Commonly known as: TENORMIN Take 0.5 tablets (12.5 mg total) by mouth daily.   atorvastatin 20 MG tablet Commonly known as: LIPITOR Take 1 tablet by mouth daily.   esomeprazole 40 MG capsule Commonly known as: NEXIUM Take 1 capsule (40 mg total) by mouth 2 (two) times daily before a meal. What changed:   how much to take  when to take this    levothyroxine 175 MCG tablet Commonly known as: SYNTHROID Take 175 mcg by mouth daily before breakfast.   loratadine 10 MG tablet Commonly known as: CLARITIN Take 10 mg by mouth daily.   methocarbamol 500 MG tablet Commonly known as: ROBAXIN Take 1-2 tablets (500-1,000 mg total) by mouth every 6 (six) hours as needed for muscle spasms.   oxyCODONE 5 MG immediate release tablet Commonly known as: Oxy IR/ROXICODONE Take 1-2 tablets (5-10 mg total) by mouth every 6 (six) hours as needed for moderate pain (pain score 4-6).   zolpidem 5 MG tablet Commonly known as: AMBIEN Take 5 mg by mouth at bedtime.       FOLLOW UP VISIT:    DISPOSITION: HOME VS. SNF  CONDITION:  Good   Guy Sandifer 04/14/2019, 7:36 AM

## 2019-05-11 ENCOUNTER — Ambulatory Visit (INDEPENDENT_AMBULATORY_CARE_PROVIDER_SITE_OTHER): Payer: Medicare HMO | Admitting: *Deleted

## 2019-05-11 DIAGNOSIS — I495 Sick sinus syndrome: Secondary | ICD-10-CM | POA: Diagnosis not present

## 2019-05-11 LAB — CUP PACEART REMOTE DEVICE CHECK
Battery Remaining Longevity: 1 mo
Battery Remaining Percentage: 0.5 %
Battery Voltage: 2.62 V
Brady Statistic AP VP Percent: 13 %
Brady Statistic AP VS Percent: 87 %
Brady Statistic AS VP Percent: 1 %
Brady Statistic AS VS Percent: 1 %
Brady Statistic RA Percent Paced: 99 %
Brady Statistic RV Percent Paced: 13 %
Date Time Interrogation Session: 20201227064035
Implantable Lead Implant Date: 20110901
Implantable Lead Implant Date: 20110901
Implantable Lead Location: 753859
Implantable Lead Location: 753860
Implantable Pulse Generator Implant Date: 20110901
Lead Channel Impedance Value: 380 Ohm
Lead Channel Impedance Value: 440 Ohm
Lead Channel Pacing Threshold Amplitude: 0.625 V
Lead Channel Pacing Threshold Amplitude: 1.875 V
Lead Channel Pacing Threshold Pulse Width: 0.4 ms
Lead Channel Pacing Threshold Pulse Width: 0.8 ms
Lead Channel Sensing Intrinsic Amplitude: 1.9 mV
Lead Channel Sensing Intrinsic Amplitude: 9.4 mV
Lead Channel Setting Pacing Amplitude: 1.625
Lead Channel Setting Pacing Amplitude: 2.125
Lead Channel Setting Pacing Pulse Width: 0.8 ms
Lead Channel Setting Sensing Sensitivity: 2 mV
Pulse Gen Model: 2210
Pulse Gen Serial Number: 7168017

## 2019-05-12 NOTE — Progress Notes (Signed)
PPM remote 

## 2019-05-22 ENCOUNTER — Telehealth: Payer: Self-pay | Admitting: Cardiovascular Disease

## 2019-05-22 NOTE — Telephone Encounter (Signed)
Spoke with pts wife, Harriett Sine and advised her of the current visitor restrictions in which she said Kengo could come to the appt alone being that he has no physical or cognitive impairment.

## 2019-05-22 NOTE — Telephone Encounter (Signed)
New Message  Pt's wife called and stated that she will be accompanying her husband to appt. Wants to make sure that she get the right information  Please call to discuss

## 2019-05-25 ENCOUNTER — Encounter: Payer: Self-pay | Admitting: Cardiovascular Disease

## 2019-05-25 ENCOUNTER — Encounter (INDEPENDENT_AMBULATORY_CARE_PROVIDER_SITE_OTHER): Payer: Self-pay

## 2019-05-25 ENCOUNTER — Ambulatory Visit (INDEPENDENT_AMBULATORY_CARE_PROVIDER_SITE_OTHER): Payer: Medicare HMO | Admitting: Cardiovascular Disease

## 2019-05-25 ENCOUNTER — Other Ambulatory Visit: Payer: Self-pay

## 2019-05-25 VITALS — BP 124/74 | HR 70 | Temp 98.1°F | Ht 70.0 in | Wt 209.0 lb

## 2019-05-25 DIAGNOSIS — I471 Supraventricular tachycardia: Secondary | ICD-10-CM

## 2019-05-25 DIAGNOSIS — G4710 Hypersomnia, unspecified: Secondary | ICD-10-CM

## 2019-05-25 DIAGNOSIS — E7800 Pure hypercholesterolemia, unspecified: Secondary | ICD-10-CM

## 2019-05-25 DIAGNOSIS — I495 Sick sinus syndrome: Secondary | ICD-10-CM | POA: Diagnosis not present

## 2019-05-25 DIAGNOSIS — Z950 Presence of cardiac pacemaker: Secondary | ICD-10-CM | POA: Diagnosis not present

## 2019-05-25 DIAGNOSIS — I10 Essential (primary) hypertension: Secondary | ICD-10-CM | POA: Diagnosis not present

## 2019-05-25 DIAGNOSIS — E663 Overweight: Secondary | ICD-10-CM

## 2019-05-25 NOTE — Patient Instructions (Addendum)
   Medication Instructions:  No changes *If you need a refill on your cardiac medications before your next appointment, please call your pharmacy*  Lab Work: None ordered If you have labs (blood work) drawn today and your tests are completely normal, you will receive your results only by: Marland Kitchen MyChart Message (if you have MyChart) OR . A paper copy in the mail If you have any lab test that is abnormal or we need to change your treatment, we will call you to review the results.  Testing/Procedures: Your physician has recommended that you have at home sleep study. This test records several body functions during sleep, including: brain activity, eye movement, oxygen and carbon dioxide blood levels, heart rate and rhythm, breathing rate and rhythm, the flow of air through your mouth and nose, snoring, body muscle movements, and chest and belly movement.  Follow-Up: At [facility], you and your health needs are our priority.  As part of our continuing mission to provide you with exceptional heart care, we have created designated Provider Care Teams.  These Care Teams include your primary Cardiologist (physician) and Advanced Practice Providers (APPs -  Physician Assistants and Nurse Practitioners) who all work together to provide you with the care you need, when you need it.  Your next appointment:   12 month(s)  The format for your next appointment:   In Person  Provider:   Thurmon Fair, MD

## 2019-05-25 NOTE — Progress Notes (Signed)
Cardiology Office Note    Date:  05/27/2019   ID:  Roy Tucker, DOB 1950-11-12, MRN [id number]  PCP:  Marva Panda, NP  Cardiologist:   Thurmon Fair, MD   Chief Complaint  Patient presents with  . Chest Pain    pt state feel like pins sticking him in chest     History of Present Illness:  Roy Tucker is a 69 y.o. male with symptomatic sinus node dysfunction returning for follow-up on his dual-chamber permanent pacemaker (St. Jude Accent implanted 2011). Additional medical problems include systemic hypertension, hyperlipidemia and treated hypothyroidism, rheumatoid arthritis.  He has some very atypical chest discomfort which does not sound anginal.  It is not associated with activity.  He denies dyspnea, syncope, palpitations, edema, focal neurological complaints, bleeding or falls.  He does complain of fatigue.  He wakes up feeling tired.  He takes naps during the day.  His wife has occasionally noted him to make sure he is still breathing.  He is not sure how loudly he snores or if he snores at all.  Interrogation of his device shows normal function, but the generator is very close to ERI, expected to occur in less than 3 months. He has >99% atrial pacing and only 13% ventricular pacing. Atrial lead thresholds and other parameters are excellent. Ventricular pacing threshold is slightly high, but better than last time, 1.5 V at 0.8 ms at 2.375 V at 0.8 ms. Ventricular sensing and lead impedance remain normal.  He has not had high ventricular rates or atrial fibrillation, not even paroxysmal atrial tachycardia recently.  Past Medical History:  Diagnosis Date  . Arthritis   . Atrial tachycardia (HCC)   . Bradycardia   . Chronic pain   . Dysrhythmia    bradycardia  . GERD (gastroesophageal reflux disease)   . Gout   . Hypertension   . Hyperthyroidism    graves on prednisone  . Pacemaker   . Pneumonia    few months ago  . Sinus node dysfunction (HCC) 01/12/2010    St.Jude pacemaker    Past Surgical History:  Procedure Laterality Date  . COLONOSCOPY W/ POLYPECTOMY    . NM MYOCAR PERF WALL MOTION  07/05/2010   no significant ischemia  . PACEMAKER INSERTION  01/12/2010   St.Jude Accent DR RF  . TOTAL KNEE ARTHROPLASTY  12/10/2011   Procedure: TOTAL KNEE ARTHROPLASTY;  Surgeon: Raymon Mutton, MD;  Location: [facility] OR;  Service: Orthopedics;  Laterality: Right;  . TOTAL KNEE ARTHROPLASTY Left 03/30/2019   Procedure: TOTAL KNEE ARTHROPLASTY;  Surgeon: Dannielle Huh, MD;  Location: [facility];  Service: Orthopedics;  Laterality: Left;  75 mins needed for length of case    Current Medications: Outpatient Medications Prior to Visit  Medication Sig Dispense Refill  . albuterol (PROVENTIL HFA;VENTOLIN HFA) 108 (90 BASE) MCG/ACT inhaler Inhale 2 puffs into the lungs every 6 (six) hours as needed for wheezing or shortness of breath. 1 Inhaler 0  . ALPRAZolam (XANAX) 1 MG tablet Take 0.5 mg by mouth 2 (two) times daily.     Marland Kitchen apixaban (ELIQUIS) 2.5 MG TABS tablet Take 1 tablet (2.5 mg total) by mouth every 12 (twelve) hours. 30 tablet 0  . atenolol (TENORMIN) 25 MG tablet Take 0.5 tablets (12.5 mg total) by mouth daily. 15 tablet 11  . atorvastatin (LIPITOR) 20 MG tablet Take 1 tablet by mouth daily.     Marland Kitchen esomeprazole (NEXIUM) 40 MG capsule Take 1 capsule (40 mg  total) by mouth 2 (two) times daily before a meal. (Patient taking differently: Take 20 mg by mouth daily. ) 60 capsule 1  . levothyroxine (SYNTHROID, LEVOTHROID) 175 MCG tablet Take 175 mcg by mouth daily before breakfast.    . loratadine (CLARITIN) 10 MG tablet Take 10 mg by mouth daily.  5  . methocarbamol (ROBAXIN) 500 MG tablet Take 1-2 tablets (500-1,000 mg total) by mouth every 6 (six) hours as needed for muscle spasms. 60 tablet 0  . oxyCODONE (OXY IR/ROXICODONE) 5 MG immediate release tablet Take 1-2 tablets (5-10 mg total) by mouth every 6 (six) hours as needed for moderate pain (pain score 4-6). 50  tablet 0  . zolpidem (AMBIEN) 5 MG tablet Take 5 mg by mouth at bedtime.     No facility-administered medications prior to visit.     Allergies:   Infliximab, Methimazole, Neurontin [gabapentin], Aspirin, and Tylenol [acetaminophen]   Social History   Socioeconomic History  . Marital status: Married    Spouse name: Not on file  . Number of children: Not on file  . Years of education: Not on file  . Highest education level: Not on file  Occupational History  . Not on file  Tobacco Use  . Smoking status: Former Smoker    Packs/day: 1.00    Years: 15.00    Pack years: 15.00    Types: Cigarettes    Quit date: 12/03/2003    Years since quitting: 15.4  . Smokeless tobacco: Never Used  Substance and Sexual Activity  . Alcohol use: No  . Drug use: No  . Sexual activity: Not Currently  Other Topics Concern  . Not on file  Social History Narrative  . Not on file   Social Determinants of Health   Financial Resource Strain:   . Difficulty of Paying Living Expenses: Not on file  Food Insecurity:   . Worried About Programme researcher, broadcasting/film/video in the Last Year: Not on file  . Ran Out of Food in the Last Year: Not on file  Transportation Needs:   . Lack of Transportation (Medical): Not on file  . Lack of Transportation (Non-Medical): Not on file  Physical Activity:   . Days of Exercise per Week: Not on file  . Minutes of Exercise per Session: Not on file  Stress:   . Feeling of Stress : Not on file  Social Connections:   . Frequency of Communication with Friends and Family: Not on file  . Frequency of Social Gatherings with Friends and Family: Not on file  . Attends Religious Services: Not on file  . Active Member of Clubs or Organizations: Not on file  . Attends Banker Meetings: Not on file  . Marital Status: Not on file       ROS:   Please see the history of present illness.    ROS All other systems are reviewed and are negative.   PHYSICAL EXAM:   VS:  BP  124/74   Pulse 70   Temp 98.1 F (36.7 C)   Ht 5\' 10"  (1.778 m)   Wt 209 lb (94.8 kg)   SpO2 100%   BMI 29.99 kg/m     General: Alert, oriented x3, no distress, borderline obese, healthy left subclavian pacemaker site Head: no evidence of trauma, PERRL, EOMI, no exophtalmos or lid lag, no myxedema, no xanthelasma; normal ears, nose and oropharynx Neck: normal jugular venous pulsations and no hepatojugular reflux; brisk carotid pulses without delay and  no carotid bruits Chest: clear to auscultation, no signs of consolidation by percussion or palpation, normal fremitus, symmetrical and full respiratory excursions Cardiovascular: normal position and quality of the apical impulse, regular rhythm, normal first and second heart sounds, no murmurs, rubs or gallops Abdomen: no tenderness or distention, no masses by palpation, no abnormal pulsatility or arterial bruits, normal bowel sounds, no hepatosplenomegaly Extremities: no clubbing, cyanosis or edema; 2+ radial, ulnar and brachial pulses bilaterally; 2+ right femoral, posterior tibial and dorsalis pedis pulses; 2+ left femoral, posterior tibial and dorsalis pedis pulses; no subclavian or femoral bruits Neurological: grossly nonfocal Psych: Normal mood and affect   Wt Readings from Last 3 Encounters:  05/25/19 209 lb (94.8 kg)  03/30/19 210 lb (95.3 kg)  03/27/19 210 lb (95.3 kg)      Studies/Labs Reviewed:   EKG:  EKG is ordered today.  It shows atrial paced, ventricular paced rhythm at 70 bpm  Recent Labs: 03/27/2019: ALT 20 03/31/2019: BUN 17; Creatinine, Ser 0.93; Hemoglobin 11.7; Platelets 115; Potassium 4.6; Sodium 138   Lipid Panel    Component Value Date/Time   CHOL 156 01/30/2016 1004   TRIG 112 01/30/2016 1004   HDL 47 01/30/2016 1004   CHOLHDL 3.3 01/30/2016 1004   VLDL 22 01/30/2016 1004   LDLCALC 87 01/30/2016 1004     ASSESSMENT:    1. SSS (sick sinus syndrome) (HCC)   2. Pacemaker   3. Paroxysmal atrial  tachycardia (HCC)   4. Essential hypertension   5. Hypercholesterolemia   6. Overweight   7. Hypersomnolence      PLAN:  In order of problems listed above:  1. SSS: 100% atrial paced rhythm.  Appropriate heart rate histogram. 2. PPM: Normal device function, but very close to needing generator replacement.  I think the ventricular lead capture threshold, while not excellent, remains acceptable and he does not require much ventricular pacing.  I do not think he needs a new right ventricular lead.  Reviewed the generator change out procedures its risks and possible complications in detail.  Anticipate we will have to do this in the next couple of months. 3. PAT: None seen on recent pacemaker downloads.  No treatment needed. 4. HTN: Well-controlled 5. HLP: Reports compliance with statin.  Labs checked by primary care provider. 6. Obesity: He is actually managed to lose some weight since his last appointment with me, remains borderline obese. 7. Hypersomnolence: Multiple symptoms from a suggestive of obstructive sleep apnea.  At a minimum he should have an overnight oximetry study.   Medication Adjustments/Labs and Tests Ordered: Current medicines are reviewed at length with the patient today.  Concerns regarding medicines are outlined above.  Medication changes, Labs and Tests ordered today are listed in the Patient Instructions below. Patient Instructions    Medication Instructions:  No changes *If you need a refill on your cardiac medications before your next appointment, please call your pharmacy*  Lab Work: None ordered If you have labs (blood work) drawn today and your tests are completely normal, you will receive your results only by: Marland Kitchen MyChart Message (if you have MyChart) OR . A paper copy in the mail If you have any lab test that is abnormal or we need to change your treatment, we will call you to review the results.  Testing/Procedures: Your physician has recommended that  you have at home sleep study. This test records several body functions during sleep, including: brain activity, eye movement, oxygen and carbon dioxide blood levels,  heart rate and rhythm, breathing rate and rhythm, the flow of air through your mouth and nose, snoring, body muscle movements, and chest and belly movement.  Follow-Up: At [facility], you and your health needs are our priority.  As part of our continuing mission to provide you with exceptional heart care, we have created designated Provider Care Teams.  These Care Teams include your primary Cardiologist (physician) and Advanced Practice Providers (APPs -  Physician Assistants and Nurse Practitioners) who all work together to provide you with the care you need, when you need it.  Your next appointment:   12 month(s)  The format for your next appointment:   In Person  Provider:   Sanda Klein, MD      Signed, Sanda Klein, MD  05/27/2019 12:25 PM    [facility] [place], [place], [place]  [address] Phone: [phone]; Fax: [phone]

## 2019-06-11 ENCOUNTER — Ambulatory Visit (INDEPENDENT_AMBULATORY_CARE_PROVIDER_SITE_OTHER): Payer: Medicare HMO | Admitting: *Deleted

## 2019-06-11 DIAGNOSIS — I495 Sick sinus syndrome: Secondary | ICD-10-CM

## 2019-06-11 LAB — CUP PACEART REMOTE DEVICE CHECK
Battery Remaining Longevity: 1 mo
Battery Remaining Percentage: 0.5 %
Battery Voltage: 2.59 V
Brady Statistic AP VP Percent: 6.8 %
Brady Statistic AP VS Percent: 93 %
Brady Statistic AS VP Percent: 1 %
Brady Statistic AS VS Percent: 1 %
Brady Statistic RA Percent Paced: 99 %
Brady Statistic RV Percent Paced: 6.8 %
Date Time Interrogation Session: 20210128025208
Implantable Lead Implant Date: 20110901
Implantable Lead Implant Date: 20110901
Implantable Lead Location: 753859
Implantable Lead Location: 753860
Implantable Pulse Generator Implant Date: 20110901
Lead Channel Impedance Value: 340 Ohm
Lead Channel Impedance Value: 410 Ohm
Lead Channel Pacing Threshold Amplitude: 0.625 V
Lead Channel Pacing Threshold Amplitude: 1.625 V
Lead Channel Pacing Threshold Pulse Width: 0.4 ms
Lead Channel Pacing Threshold Pulse Width: 0.8 ms
Lead Channel Sensing Intrinsic Amplitude: 1.9 mV
Lead Channel Sensing Intrinsic Amplitude: 8.2 mV
Lead Channel Setting Pacing Amplitude: 1.625
Lead Channel Setting Pacing Amplitude: 1.875
Lead Channel Setting Pacing Pulse Width: 0.8 ms
Lead Channel Setting Sensing Sensitivity: 2 mV
Pulse Gen Model: 2210
Pulse Gen Serial Number: 7168017

## 2019-06-11 NOTE — Progress Notes (Signed)
PPM Remote  

## 2019-06-12 ENCOUNTER — Telehealth: Payer: Self-pay | Admitting: *Deleted

## 2019-06-12 NOTE — Telephone Encounter (Signed)
Patient and wife notified of HST appointment.

## 2019-07-13 ENCOUNTER — Ambulatory Visit (INDEPENDENT_AMBULATORY_CARE_PROVIDER_SITE_OTHER): Payer: Medicare HMO | Admitting: *Deleted

## 2019-07-13 DIAGNOSIS — I495 Sick sinus syndrome: Secondary | ICD-10-CM

## 2019-07-13 LAB — CUP PACEART REMOTE DEVICE CHECK
Battery Remaining Longevity: 1 mo
Battery Remaining Percentage: 0.5 %
Battery Voltage: 2.59 V
Brady Statistic AP VP Percent: 7.2 %
Brady Statistic AP VS Percent: 93 %
Brady Statistic AS VP Percent: 1 %
Brady Statistic AS VS Percent: 1 %
Brady Statistic RA Percent Paced: 99 %
Brady Statistic RV Percent Paced: 7.2 %
Date Time Interrogation Session: 20210301030107
Implantable Lead Implant Date: 20110901
Implantable Lead Implant Date: 20110901
Implantable Lead Location: 753859
Implantable Lead Location: 753860
Implantable Pulse Generator Implant Date: 20110901
Lead Channel Impedance Value: 350 Ohm
Lead Channel Impedance Value: 440 Ohm
Lead Channel Pacing Threshold Amplitude: 0.625 V
Lead Channel Pacing Threshold Amplitude: 1.75 V
Lead Channel Pacing Threshold Pulse Width: 0.4 ms
Lead Channel Pacing Threshold Pulse Width: 0.8 ms
Lead Channel Sensing Intrinsic Amplitude: 1.9 mV
Lead Channel Sensing Intrinsic Amplitude: 10.1 mV
Lead Channel Setting Pacing Amplitude: 1.625
Lead Channel Setting Pacing Amplitude: 2 V
Lead Channel Setting Pacing Pulse Width: 0.8 ms
Lead Channel Setting Sensing Sensitivity: 2 mV
Pulse Gen Model: 2210
Pulse Gen Serial Number: 7168017

## 2019-08-13 ENCOUNTER — Telehealth: Payer: Self-pay | Admitting: Emergency Medicine

## 2019-08-13 DIAGNOSIS — I495 Sick sinus syndrome: Secondary | ICD-10-CM

## 2019-08-13 DIAGNOSIS — Z01818 Encounter for other preprocedural examination: Secondary | ICD-10-CM

## 2019-08-13 NOTE — Telephone Encounter (Signed)
Thanks.  We discussed the generator change  procedure in detail at his last appointment and he does not need another appointment (unless the patient wants it for some reason).  Please just schedule for generator change out and I will do the H&P on that day.

## 2019-08-13 NOTE — Telephone Encounter (Signed)
LMOM to call device clinic. ST Jude PPM at ERI. Needs appointment with Dr Rubie Maid to discuss generator change.

## 2019-08-14 ENCOUNTER — Encounter (HOSPITAL_BASED_OUTPATIENT_CLINIC_OR_DEPARTMENT_OTHER): Payer: Medicare HMO | Admitting: Cardiovascular Disease

## 2019-08-17 ENCOUNTER — Encounter: Payer: Self-pay | Admitting: *Deleted

## 2019-08-17 NOTE — Telephone Encounter (Signed)
Call placed to the patient. He was currently unavailable. His wife stated to schedule for 4/19.  Instructions and lab orders will be mailed.     [facility] at [facility]  [address], [address]  [place], [place] [address]  Phone: [phone] Fax: [phone]    Generator Change Procedure Instructions  You are scheduled for a Generator Change (battery change) on  08/31/19  with Dr. Royann Shivers.  1. Please arrive at the [facility], Entrance "A"  at [facility] at  1:30 pm on the day of your procedure. (The address is [address]  2. DIET: Do not eat or drink after midnight the night before your procedure. You may have a light breakfast that morning.   3. LABS: Your provider would like for you to return on 08/27/19 to have the following labs drawn: BMET and CBC. You do not need an appointment for the lab. Once in our office lobby there is a podium where you can sign in and ring the doorbell to alert [place] that you are here. The lab is open from 8:00 am to 4:30 pm; closed for lunch from 12:45pm-1:45pm.  You will need to have the coronavirus test completed prior to your procedure. An appointment has been made at 12:15pm on 08/27/19. This is a Drive Up Visit at the [facility] [address] Please tell them that you are there for pre procedure testing. Someone will direct you to the appropriate testing line. Stay in your car and someone will be with you shortly. Please make sure to have all other labs completed before this test because you will need to stay quarantined until your procedure.   4. MEDICATIONS: Nothing to hold  5.  Plan for an overnight stay.  Bring your insurance cards and a list of you medications.  6.  Wash your chest and neck with surgical scrub the evening before and the morning of your procedure.  Rinse well. Please review the surgical scrub instruction sheet given to you.   7. Your chest will need to be shaved  prior to this procedure (if needed). We ask that you do this yourself at home 1 to 2 days before or if uncomfortable/unable to do yourself, then it will be performed by the hospital staff the day of.  * Special note:  Every effort is made to have your procedure done on time.  Occasionally there are emergencies that present themselves at the hospital that may cause delays.  Please be patient if a delay does occur.                                                                                                           * If you have any questions after you get home, please call Misty Stanley, RN at [phone].    [facility] - Preparing For Surgery  Before surgery, you can play an important role. Because skin is not sterile, your skin needs to be as free of germs as possible. You can reduce  the number of germs on your skin by washing with CHG (chlorahexidine gluconate) Soap before surgery.  CHG is an antiseptic cleaner which kills germs and bonds with the skin to continue killing germs even after washing.   Please do not use if you have an allergy to CHG or antibacterial soaps.  If your skin becomes reddened/irritated stop using the CHG.   Do not shave (including legs and underarms) for at least 48 hours prior to first CHG shower.  It is OK to shave your face.  Please follow these instructions carefully:  1.  Shower the night before surgery and the morning of surgery with CHG.  2.  If you choose to wash your hair, wash your hair first as usual with your normal shampoo.  3.  After you shampoo, rinse your hair and body thoroughly to remove the shampoo.  4.  Use CHG as you would any other liquid soap.  You can apply CHG directly to the skin and wash gently with a clean washcloth. 5.  Apply the CHG Soap to your body ONLY FROM THE NECK DOWN.  Do not use on open wounds or open sores.  Avoid contact with your eyes, ears, mouth and genitals (private parts).  Wash genitals (private parts) with your normal soap.   6.  Wash thoroughly, paying special attention to the area where your surgery will be performed.  7.  Thoroughly rise your body with warm water from the neck down.   8.  DO NOT shower/wash with your normal soap after using and rinsing off the CHG soap.  9.  Pat yourself dry with a clean towel.           10.  Wear clean pajamas.           11.  Place clean sheets on your bed the night of your first shower and do not sleep with pets.  Day of Surgery: Do not apply any deodorants/lotions.  Please wear clean clothes to the hospital/surgery center.

## 2019-08-24 ENCOUNTER — Telehealth: Payer: Self-pay | Admitting: Cardiovascular Disease

## 2019-08-24 NOTE — Telephone Encounter (Signed)
Returned the call to the patient. He stated that he does not want to do the procedure now because he has other "appointments" and he feels fine. He has been educated on what ERI means and that he has a limited time to get this completed.   The next available option would be in June.

## 2019-08-24 NOTE — Telephone Encounter (Signed)
The procedure has been rescheduled for 4/26 at 1:30 pm.

## 2019-08-24 NOTE — Telephone Encounter (Signed)
New Message    Pts wife is calling and is wondering if they can change the procedure appt because he is scheduled for his second dose of the covid vaccine the same day     Please advise

## 2019-08-25 ENCOUNTER — Encounter: Payer: Self-pay | Admitting: *Deleted

## 2019-08-25 NOTE — Telephone Encounter (Signed)
The patient has requested that the procedure instructions be mailed to him.     [facility] at [facility]  [address]  [place], [place] [address]  Phone: [phone] Fax: [phone]    Generator Change Procedure Instructions  You are scheduled for a Generator Change (battery change) on  09/07/19  with Dr. Royann Shivers.  1. Please arrive at the [facility], Entrance "A"  at [facility] at  11:30 am on the day of your procedure. (The address is [address])  2. DIET: Do not eat or drink after midnight the night before your procedure.  3. LABS: Your provider would like for you to return on 09/03/19 to have the following labs drawn: BMET and CBC. You do not need an appointment for the lab. Once in our office lobby there is a podium where you can sign in and ring the doorbell to alert [place] that you are here. The lab is open from 8:00 am to 4:30 pm; closed for lunch from 12:45pm-1:45pm.  You will need to have the coronavirus test completed prior to your procedure. An appointment has been made at 12:15 on 09/03/19. This is a Drive Up Visit at the [facility] [address]. Please tell them that you are there for pre procedure testing. Someone will direct you to the appropriate testing line. Stay in your car and someone will be with you shortly. Please make sure to have all other labs completed before this test because you will need to stay quarantined until your procedure.   4. MEDICATIONS: Nothing to hold  5.  Plan for an overnight stay.  Bring your insurance cards and a list of you medications.  6.  Wash your chest and neck with surgical scrub the evening before and the morning of your procedure.  Rinse well. Please review the surgical scrub instruction sheet given to you.   7. Your chest will need to be shaved prior to this procedure (if needed). We ask that you do this yourself at home 1 to 2 days before or if  uncomfortable/unable to do yourself, then it will be performed by the hospital staff the day of.  * Special note:  Every effort is made to have your procedure done on time.  Occasionally there are emergencies that present themselves at the hospital that may cause delays.  Please be patient if a delay does occur.                                                                                                           * If you have any questions after you get home, please call Misty Stanley, RN at [phone].    [place] [facility] - Preparing For Surgery  Before surgery, you can play an important role. Because skin is not sterile, your skin needs to be as free of germs as possible. You can reduce the number of germs on your skin by washing with CHG (chlorahexidine gluconate) Soap before surgery.  CHG is an antiseptic  cleaner which kills germs and bonds with the skin to continue killing germs even after washing.   Please do not use if you have an allergy to CHG or antibacterial soaps.  If your skin becomes reddened/irritated stop using the CHG.   Do not shave (including legs and underarms) for at least 48 hours prior to first CHG shower.  It is OK to shave your face.  Please follow these instructions carefully:  1.  Shower the night before surgery and the morning of surgery with CHG.  2.  If you choose to wash your hair, wash your hair first as usual with your normal shampoo.  3.  After you shampoo, rinse your hair and body thoroughly to remove the shampoo.  4.  Use CHG as you would any other liquid soap.  You can apply CHG directly to the skin and wash gently with a clean washcloth. 5.  Apply the CHG Soap to your body ONLY FROM THE NECK DOWN.  Do not use on open wounds or open sores.  Avoid contact with your eyes, ears, mouth and genitals (private parts).  Wash genitals (private parts) with your normal soap.  6.  Wash thoroughly, paying special attention to the area where your surgery will be performed.  7.   Thoroughly rise your body with warm water from the neck down.   8.  DO NOT shower/wash with your normal soap after using and rinsing off the CHG soap.  9.  Pat yourself dry with a clean towel.           10.  Wear clean pajamas.           11.  Place clean sheets on your bed the night of your first shower and do not sleep with pets.  Day of Surgery: Do not apply any deodorants/lotions.  Please wear clean clothes to the hospital/surgery center.

## 2019-08-27 ENCOUNTER — Other Ambulatory Visit (HOSPITAL_COMMUNITY): Payer: Medicare HMO

## 2019-09-03 ENCOUNTER — Other Ambulatory Visit (HOSPITAL_COMMUNITY)
Admission: RE | Admit: 2019-09-03 | Discharge: 2019-09-03 | Disposition: A | Discharge status: Home / Self Care | Payer: Medicare HMO | Source: Ambulatory Visit | Attending: Cardiovascular Disease | Admitting: Cardiovascular Disease

## 2019-09-03 DIAGNOSIS — Z01812 Encounter for preprocedural laboratory examination: Secondary | ICD-10-CM | POA: Diagnosis present

## 2019-09-03 DIAGNOSIS — Z20822 Contact with and (suspected) exposure to covid-19: Secondary | ICD-10-CM | POA: Insufficient documentation

## 2019-09-03 LAB — SARS CORONAVIRUS 2 (TAT 6-24 HRS): SARS Coronavirus 2: NEGATIVE

## 2019-09-04 ENCOUNTER — Other Ambulatory Visit: Payer: Self-pay | Admitting: *Deleted

## 2019-09-04 DIAGNOSIS — I495 Sick sinus syndrome: Secondary | ICD-10-CM

## 2019-09-04 LAB — CBC
Hematocrit: 41 % (ref 37.5–51.0)
Hemoglobin: 14.3 g/dL (ref 13.0–17.7)
MCH: 29.2 pg (ref 26.6–33.0)
MCHC: 34.9 g/dL (ref 31.5–35.7)
MCV: 84 fL (ref 79–97)
Platelets: 132 10*3/uL — ABNORMAL LOW (ref 150–450)
RBC: 4.89 x10E6/uL (ref 4.14–5.80)
RDW: 13.8 % (ref 11.6–15.4)
WBC: 4.2 10*3/uL (ref 3.4–10.8)

## 2019-09-04 LAB — BASIC METABOLIC PANEL
BUN/Creatinine Ratio: 14 (ref 10–24)
BUN: 14 mg/dL (ref 8–27)
CO2: 27 mmol/L (ref 20–29)
Calcium: 10.2 mg/dL (ref 8.6–10.2)
Chloride: 105 mmol/L (ref 96–106)
Creatinine, Ser: 0.98 mg/dL (ref 0.76–1.27)
GFR calc Af Amer: 92 mL/min/{1.73_m2} (ref >59)
GFR calc non Af Amer: 80 mL/min/{1.73_m2} (ref >59)
Glucose: 70 mg/dL (ref 65–99)
Potassium: 5.2 mmol/L (ref 3.5–5.2)
Sodium: 146 mmol/L — ABNORMAL HIGH (ref 134–144)

## 2019-09-07 ENCOUNTER — Other Ambulatory Visit: Payer: Self-pay

## 2019-09-07 ENCOUNTER — Ambulatory Visit (HOSPITAL_COMMUNITY)
Admission: RE | Admit: 2019-09-07 | Discharge: 2019-09-07 | Disposition: A | Discharge status: Home / Self Care | Payer: Medicare HMO | Attending: Cardiovascular Disease | Admitting: Cardiovascular Disease

## 2019-09-07 ENCOUNTER — Telehealth: Payer: Self-pay | Admitting: Cardiovascular Disease

## 2019-09-07 ENCOUNTER — Ambulatory Visit (HOSPITAL_COMMUNITY)
Admission: RE | Disposition: A | Discharge status: Home / Self Care | Payer: Medicare HMO | Source: Home / Self Care | Attending: Cardiovascular Disease

## 2019-09-07 DIAGNOSIS — Z45010 Encounter for checking and testing of cardiac pacemaker pulse generator [battery]: Secondary | ICD-10-CM

## 2019-09-07 DIAGNOSIS — Z96653 Presence of artificial knee joint, bilateral: Secondary | ICD-10-CM | POA: Insufficient documentation

## 2019-09-07 DIAGNOSIS — I10 Essential (primary) hypertension: Secondary | ICD-10-CM | POA: Diagnosis not present

## 2019-09-07 DIAGNOSIS — E669 Obesity, unspecified: Secondary | ICD-10-CM | POA: Insufficient documentation

## 2019-09-07 DIAGNOSIS — Z8249 Family history of ischemic heart disease and other diseases of the circulatory system: Secondary | ICD-10-CM | POA: Insufficient documentation

## 2019-09-07 DIAGNOSIS — Z888 Allergy status to other drugs, medicaments and biological substances status: Secondary | ICD-10-CM | POA: Insufficient documentation

## 2019-09-07 DIAGNOSIS — Z6829 Body mass index (BMI) 29.0-29.9, adult: Secondary | ICD-10-CM | POA: Insufficient documentation

## 2019-09-07 DIAGNOSIS — E785 Hyperlipidemia, unspecified: Secondary | ICD-10-CM | POA: Insufficient documentation

## 2019-09-07 DIAGNOSIS — Z886 Allergy status to analgesic agent status: Secondary | ICD-10-CM | POA: Insufficient documentation

## 2019-09-07 DIAGNOSIS — Z87891 Personal history of nicotine dependence: Secondary | ICD-10-CM | POA: Insufficient documentation

## 2019-09-07 DIAGNOSIS — M109 Gout, unspecified: Secondary | ICD-10-CM | POA: Insufficient documentation

## 2019-09-07 DIAGNOSIS — Z79890 Hormone replacement therapy (postmenopausal): Secondary | ICD-10-CM | POA: Diagnosis not present

## 2019-09-07 DIAGNOSIS — M1990 Unspecified osteoarthritis, unspecified site: Secondary | ICD-10-CM | POA: Insufficient documentation

## 2019-09-07 DIAGNOSIS — Z79899 Other long term (current) drug therapy: Secondary | ICD-10-CM | POA: Diagnosis not present

## 2019-09-07 DIAGNOSIS — K219 Gastro-esophageal reflux disease without esophagitis: Secondary | ICD-10-CM | POA: Diagnosis not present

## 2019-09-07 DIAGNOSIS — I495 Sick sinus syndrome: Secondary | ICD-10-CM | POA: Diagnosis not present

## 2019-09-07 DIAGNOSIS — Z791 Long term (current) use of non-steroidal anti-inflammatories (NSAID): Secondary | ICD-10-CM | POA: Diagnosis not present

## 2019-09-07 DIAGNOSIS — E039 Hypothyroidism, unspecified: Secondary | ICD-10-CM | POA: Diagnosis not present

## 2019-09-07 HISTORY — PX: PPM GENERATOR CHANGEOUT: EP1233

## 2019-09-07 SURGERY — PPM GENERATOR CHANGEOUT

## 2019-09-07 MED ORDER — CHLORHEXIDINE GLUCONATE 4 % EX LIQD: 4.0000 "application " | Freq: Once | CUTANEOUS | Status: DC

## 2019-09-07 MED ORDER — LIDOCAINE HCL 1 % IJ SOLN
INTRAMUSCULAR | Status: AC
  Filled 2019-09-07: qty 60

## 2019-09-07 MED ORDER — SODIUM CHLORIDE 0.9 % IV SOLN
INTRAVENOUS | Status: AC
  Filled 2019-09-07: qty 2

## 2019-09-07 MED ORDER — SODIUM CHLORIDE 0.9 % IV SOLN
80.0000 mg | INTRAVENOUS | Status: AC
  Administered 2019-09-07: 80 mg
  Filled 2019-09-07: qty 2

## 2019-09-07 MED ORDER — CEFAZOLIN SODIUM-DEXTROSE 2-4 GM/100ML-% IV SOLN
INTRAVENOUS | Status: AC
  Filled 2019-09-07: qty 100

## 2019-09-07 MED ORDER — ONDANSETRON HCL 4 MG/2ML IJ SOLN: 4.0000 mg | Freq: Four times a day (QID) | INTRAMUSCULAR | Status: DC | PRN

## 2019-09-07 MED ORDER — CEFAZOLIN SODIUM-DEXTROSE 2-4 GM/100ML-% IV SOLN
2.0000 g | INTRAVENOUS | Status: AC
  Administered 2019-09-07: 15:00:00 2 g via INTRAVENOUS

## 2019-09-07 MED ORDER — LIDOCAINE HCL (PF) 1 % IJ SOLN
INTRAMUSCULAR | Status: DC | PRN
  Administered 2019-09-07: 30 mL

## 2019-09-07 MED ORDER — LIDOCAINE HCL (PF) 1 % IJ SOLN
INTRAMUSCULAR | Status: AC
  Filled 2019-09-07: qty 90

## 2019-09-07 MED ORDER — SODIUM CHLORIDE 0.9 % IV SOLN: INTRAVENOUS | Status: DC

## 2019-09-07 SURGICAL SUPPLY — 4 items
CABLE SURGICAL S-101-97-12 (CABLE) ×3 IMPLANT
PACEMAKER ASSURITY DR-RF (Pacemaker) ×2 IMPLANT
PAD PRO RADIOLUCENT 2001M-C (PAD) ×3 IMPLANT
TRAY PACEMAKER INSERTION (PACKS) ×3 IMPLANT

## 2019-09-07 NOTE — Discharge Instructions (Signed)

## 2019-09-07 NOTE — Telephone Encounter (Signed)
Patient's spouse states she would like to inquire about whether or not the patient is still eligible to take his medication this morning due to heart cath scheduled for 1:30 PM. Please advise.

## 2019-09-07 NOTE — H&P (Signed)
Cardiology Admission History and Physical:   Patient ID: Roy Tucker MRN: [id number]; DOB: August 31, 1950   Admission date: 09/07/2019  Primary Care Provider: Everardo Beals, NP Primary Cardiologist: Sanda Klein, MD  Primary Electrophysiologist:  None  Chief Complaint: Pacemaker battery change out  Patient Profile:   Roy Tucker is a 69 y.o. male with sinus node dysfunction and a dual-chamber permanent pacemaker that has recently reached elective replacement indicator  History of Present Illness:   Roy Tucker has sinus node dysfunction and received the pacemaker (dual-chamber [employer]) in 2011.  He also has a history of paroxysmal atrial tachycardia.  His device recently reached ERI.  Atrial lead parameters have been excellent, but his ventricular lead pacing threshold is relatively high (around 2 V at 0.8 ms).  Ventricular sensing and impedance are normal.  He rarely requires ventricular pacing but has virtually 100% atrial pacing.  Additional medical problems include hypertension, hyperlipidemia, bilateral knee replacement, remote history of hyperthyroidism (now on levothyroxine supplement for hypothyroidism) and obesity and a possible diagnosis of obstructive sleep apnea   Past Medical History:  Diagnosis Date  . Arthritis   . Atrial tachycardia ([place])   . Bradycardia   . Chronic pain   . Dysrhythmia    bradycardia  . GERD (gastroesophageal reflux disease)   . Gout   . Hypertension   . Hyperthyroidism    graves on prednisone  . Pacemaker   . Pneumonia    few months ago  . Sinus node dysfunction ([place]) 01/12/2010   St.Jude pacemaker    Past Surgical History:  Procedure Laterality Date  . COLONOSCOPY W/ POLYPECTOMY    . NM MYOCAR PERF WALL MOTION  07/05/2010   no significant ischemia  . PACEMAKER INSERTION  01/12/2010   St.Jude Accent DR RF  . TOTAL KNEE ARTHROPLASTY  12/10/2011   Procedure: TOTAL KNEE ARTHROPLASTY;  Surgeon: Rudean Haskell, MD;   Location: [facility];  Service: Orthopedics;  Laterality: Right;  . TOTAL KNEE ARTHROPLASTY Left 03/30/2019   Procedure: TOTAL KNEE ARTHROPLASTY;  Surgeon: Vickey Huger, MD;  Location: [facility];  Service: Orthopedics;  Laterality: Left;  75 mins needed for length of case     Medications Prior to Admission: Prior to Admission medications   Medication Sig Start Date End Date Taking? Authorizing Provider  ALPRAZolam Duanne Moron) 1 MG tablet Take 0.5 mg by mouth 2 (two) times daily.    Yes [provider]  atenolol (TENORMIN) 25 MG tablet Take 0.5 tablets (12.5 mg total) by mouth daily. 04/30/18  Yes Nyle Limb, MD  atorvastatin (LIPITOR) 20 MG tablet Take 20 mg by mouth daily.    Yes [provider]  esomeprazole (NEXIUM) 40 MG capsule Take 1 capsule (40 mg total) by mouth 2 (two) times daily before a meal. Patient taking differently: Take 40 mg by mouth 2 (two) times daily as needed (acid reflux/indigestion.).  04/25/16  Yes Barrett, Evelene Croon, PA-C  levothyroxine (SYNTHROID, LEVOTHROID) 175 MCG tablet Take 175 mcg by mouth daily before breakfast.   Yes [provider]  loratadine (CLARITIN) 10 MG tablet Take 10 mg by mouth daily. 09/11/17  Yes [provider]  meloxicam (MOBIC) 15 MG tablet Take 15 mg by mouth daily. 07/12/19  Yes [provider]  oxyCODONE-acetaminophen (PERCOCET) 10-325 MG tablet Take 1 tablet by mouth every 6 (six) hours as needed for pain. 08/07/19  Yes [provider]  methocarbamol (ROBAXIN) 500 MG tablet Take 1-2 tablets (500-1,000 mg total) by  mouth every 6 (six) hours as needed for muscle spasms. Patient not taking: Reported on 08/19/2019 03/31/19   Guy Sandifer, PA  oxyCODONE (OXY IR/ROXICODONE) 5 MG immediate release tablet Take 1-2 tablets (5-10 mg total) by mouth every 6 (six) hours as needed for moderate pain (pain score 4-6). Patient not taking: Reported on 08/19/2019 03/31/19   Guy Sandifer, [place]     Allergies:     Allergies  Allergen Reactions  . Infliximab Other (See Comments)    UNKNOWN  . Methimazole Nausea Only    unsure  . Neurontin [Gabapentin] Itching  . Aspirin Rash  . Tylenol [Acetaminophen] Rash    Social History:   Social History   Socioeconomic History  . Marital status: Married    Spouse name: Not on file  . Number of children: Not on file  . Years of education: Not on file  . Highest education level: Not on file  Occupational History  . Not on file  Tobacco Use  . Smoking status: Former Smoker    Packs/day: 1.00    Years: 15.00    Pack years: 15.00    Types: Cigarettes    Quit date: 12/03/2003    Years since quitting: 15.7  . Smokeless tobacco: Never Used  Substance and Sexual Activity  . Alcohol use: No  . Drug use: No  . Sexual activity: Not Currently  Other Topics Concern  . Not on file  Social History Narrative  . Not on file   Social Determinants of Health   Financial Resource Strain:   . Difficulty of Paying Living Expenses:   Food Insecurity:   . Worried About Programme researcher, broadcasting/film/video in the Last Year:   . Barista in the Last Year:   Transportation Needs:   . Freight forwarder (Medical):   Marland Kitchen Lack of Transportation (Non-Medical):   Physical Activity:   . Days of Exercise per Week:   . Minutes of Exercise per Session:   Stress:   . Feeling of Stress :   Social Connections:   . Frequency of Communication with Friends and Family:   . Frequency of Social Gatherings with Friends and Family:   . Attends Religious Services:   . Active Member of Clubs or Organizations:   . Attends Banker Meetings:   Marland Kitchen Marital Status:   Intimate Partner Violence:   . Fear of Current or Ex-Partner:   . Emotionally Abused:   Marland Kitchen Physically Abused:   . Sexually Abused:     Family History:   The patient's family history includes Cancer in his father; Heart disease in his brother; Heart failure in an other family member.    ROS:  Please see the  history of present illness.  All other ROS reviewed and negative.     Physical Exam/Data:  There were no vitals filed for this visit. No intake or output data in the 24 hours ending 09/07/19 1130 Last 3 Weights 05/25/2019 03/30/2019 03/27/2019  Weight (lbs) 209 lb 210 lb 210 lb  Weight (kg) 94.802 kg 95.255 kg 95.255 kg     There is no height or weight on file to calculate BMI.  General:  Well nourished, well developed, in no acute distress HEENT: normal Lymph: no adenopathy Neck: no JVD Endocrine:  No thryomegaly Vascular: No carotid bruits; FA pulses 2+ bilaterally without bruits  Cardiac:  normal S1, S2; RRR; no murmur , healthy left subclavian pacemaker site Lungs:  clear to  auscultation bilaterally, no wheezing, rhonchi or rales  Abd: soft, nontender, no hepatomegaly  Ext: no edema Musculoskeletal:  No deformities, BUE and BLE strength normal and equal Skin: warm and dry  Neuro:  CNs 2-12 intact, no focal abnormalities noted Psych:  Normal affect    EKG:  The ECG that was done 05/25/2019 was personally reviewed and demonstrates atrial paced rhythm with intermittent ventricular pacing  Relevant CV Studies: Pacemaker download shows device at [facility] 08/12/2019  Laboratory Data:  High Sensitivity Troponin:  No results for input(s): TROPONINIHS in the last 720 hours.    Chemistry Recent Labs  Lab 09/03/19 1230  NA 146*  K 5.2  CL 105  CO2 27  GLUCOSE 70  BUN 14  CREATININE 0.98  CALCIUM 10.2  GFRNONAA 80  GFRAA 92    No results for input(s): PROT, ALBUMIN, AST, ALT, ALKPHOS, BILITOT in the last 168 hours. Hematology Recent Labs  Lab 09/03/19 1230  WBC 4.2  RBC 4.89  HGB 14.3  HCT 41.0  MCV 84  MCH 29.2  MCHC 34.9  RDW 13.8  PLT 132*   BNPNo results for input(s): BNP, PROBNP in the last 168 hours.  DDimer No results for input(s): DDIMER in the last 168 hours.   Radiology/Studies:  No results found. {   Assessment and Plan:   1. SSS: Has virtually  100% atrial pacing, for symptomatic sinus bradycardia. 2. PM@ERI : For generator change out today.  Although his ventricular lead parameters are not optimal (elevated pacing threshold), he requires ventricular pacing so infrequently that it is not yet justified to replace the ventricular lead. This procedure has been fully reviewed with the patient and written informed consent has been obtained. 3. PAT: No recent episodes recorded by his device.  Does not have recent palpitations. 4. Obesity: We will follow up on the results of his sleep study.  For questions or updates, please contact [facility] Please consult [email or web address] for contact info under        Signed, Thurmon Fair, MD  09/07/2019 11:30 AM

## 2019-09-07 NOTE — Op Note (Signed)
Procedure report  Procedure performed:  Dual chamber generator changeout   Reason for procedure:  1. Device generator at elective replacement interval  2. Sinus node dysfunction Procedure performed by:  Thurmon Fair, MD  Complications:  None  Estimated blood loss:  <5 mL  Medications administered during procedure:  Ancef 2 g intravenously, lidocaine 1% 30 mL locally Device details:   [employer]. Jude Assurity MRI model number 2272, serial number [id number] Right atrial lead (chronic) St. Jude , model number 2088TC-52, serial [id number] (implanted 01/12/2010) Right ventricular lead (chronic)  St. Jude, model number 2088TC-58, serial number [id number] (implanted 01/12/2010)  Explanted generator St. Jude Accent,  model number 2210, serial number  [id number] (implanted 01/12/2010)  Procedure details:  After the risks and benefits of the procedure were discussed the patient provided informed consent. She was brought to the cardiac catheter lab in the fasting state. The patient was prepped and draped in usual sterile fashion. Local anesthesia with 1% lidocaine was administered to to the left infraclavicular area. A 5-6cm horizontal incision was made parallel with and 2-3 cm caudal to the left clavicle, in the area of an old scar. An older scar was seen closer to the left clavicle. Using minimal electrocautery and mostly sharp and blunt dissection the prepectoral pocket was opened carefully to avoid injury to the loops of chronic leads. Extensive dissection was not necessary. The device was explanted. The pocket was carefully inspected for hemostasis and flushed with copious amounts of antibiotic solution.  The leads were disconnected from the old generator and testing of the lead parameters later showed excellent values. The new generator was connected to the chronic leads, with appropriate pacing noted.   The entire system was then carefully inserted in the pocket with care been taking  that the leads and device assumed a comfortable position without pressure on the incision. Great care was taken that the leads be located deep to the generator. The pocket was then closed in layers using 2 layers of 2-0 Vicryl and cutaneous staples after which a sterile dressing was applied.   At the end of the procedure the following lead parameters were encountered:   Right atrial lead sensed P waves 1.8 mV, impedance 450 ohms, threshold 0.625 at 0.5 ms pulse width.  Right ventricular lead sensed R waves  8.4 mV, impedance 390 ohms, threshold 2.125 at 0.5 ms pulse width.  Thurmon Fair, MD, [facility] [phone] office [phone] pager

## 2019-09-07 NOTE — Telephone Encounter (Signed)
The wife has been called back and advised about the medication for the pacemaker generator change this afternoon. She has verbalized her understanding.

## 2019-09-08 MED FILL — Lidocaine HCl Local Inj 1%: INTRAMUSCULAR | Qty: 20 | Status: AC

## 2019-09-08 MED FILL — Lidocaine HCl Local Inj 1%: INTRAMUSCULAR | Qty: 40 | Status: AC

## 2019-09-10 ENCOUNTER — Ambulatory Visit: Payer: Medicare HMO

## 2019-09-17 ENCOUNTER — Ambulatory Visit (INDEPENDENT_AMBULATORY_CARE_PROVIDER_SITE_OTHER): Payer: Medicare HMO | Admitting: Emergency Medicine

## 2019-09-17 ENCOUNTER — Other Ambulatory Visit: Payer: Self-pay

## 2019-09-17 ENCOUNTER — Telehealth: Payer: Self-pay

## 2019-09-17 DIAGNOSIS — I495 Sick sinus syndrome: Secondary | ICD-10-CM | POA: Diagnosis not present

## 2019-09-17 DIAGNOSIS — Z950 Presence of cardiac pacemaker: Secondary | ICD-10-CM | POA: Diagnosis not present

## 2019-09-17 NOTE — Telephone Encounter (Signed)
Patient wife called to let us know that the monitor is fixed and she sent a transmission to ensure that it is. Transmission was successful.

## 2019-09-17 NOTE — Patient Instructions (Signed)
Check monitor when you get home to see if unplugged. Call office to send manual transmission at [phone].

## 2019-09-18 LAB — CUP PACEART REMOTE DEVICE CHECK
Battery Remaining Longevity: 104 mo
Battery Remaining Percentage: 95.5 %
Battery Voltage: 3.14 V
Brady Statistic AP VP Percent: 18 %
Brady Statistic AP VS Percent: 82 %
Brady Statistic AS VP Percent: 0 %
Brady Statistic AS VS Percent: 1 %
Brady Statistic RA Percent Paced: 99 %
Brady Statistic RV Percent Paced: 18 %
Date Time Interrogation Session: 20210506134007
Implantable Lead Implant Date: 20110901
Implantable Lead Implant Date: 20110901
Implantable Lead Location: 753859
Implantable Lead Location: 753860
Implantable Pulse Generator Implant Date: 20210426
Lead Channel Impedance Value: 380 Ohm
Lead Channel Impedance Value: 440 Ohm
Lead Channel Pacing Threshold Amplitude: 0.5 V
Lead Channel Pacing Threshold Amplitude: 1.625 V
Lead Channel Pacing Threshold Pulse Width: 0.5 ms
Lead Channel Pacing Threshold Pulse Width: 0.8 ms
Lead Channel Sensing Intrinsic Amplitude: 1.9 mV
Lead Channel Sensing Intrinsic Amplitude: 8.2 mV
Lead Channel Setting Pacing Amplitude: 1.5 V
Lead Channel Setting Pacing Amplitude: 1.875
Lead Channel Setting Pacing Pulse Width: 0.8 ms
Lead Channel Setting Sensing Sensitivity: 2 mV
Pulse Gen Model: 2272
Pulse Gen Serial Number: 3825617

## 2019-09-22 NOTE — Progress Notes (Signed)
Wound check appointment. Steri-strips removed. Wound without redness or edema. Incision edges approximated, wound well healed. Normal device function. Thresholds, sensing, and impedances consistent with implant measurements. Device programmed at chronic settings due to mature leads. V pulse width programmed to 0.8 ms which it was historically set at prior to gen change. RV Histogram distribution appropriate for patient and level of activity. 2 AMS episodes that were false AF related to oversensing atrial lead  noise, noise was not reproducible with isometrics. No  high ventricular rates noted. Patient educated about wound care, arm mobility, lifting restrictions. ROV iwityh Dr Rubie Maid on 12/14/19. Next remote scheduled for 10/15/19.

## 2019-12-14 ENCOUNTER — Encounter: Payer: Medicare HMO | Admitting: Cardiovascular Disease

## 2019-12-17 ENCOUNTER — Ambulatory Visit (INDEPENDENT_AMBULATORY_CARE_PROVIDER_SITE_OTHER): Payer: Medicare HMO | Admitting: *Deleted

## 2019-12-17 DIAGNOSIS — I495 Sick sinus syndrome: Secondary | ICD-10-CM | POA: Diagnosis not present

## 2019-12-17 LAB — CUP PACEART REMOTE DEVICE CHECK
Battery Remaining Longevity: 110 mo
Battery Remaining Percentage: 95.5 %
Battery Voltage: 3.02 V
Brady Statistic AP VP Percent: 3.5 %
Brady Statistic AP VS Percent: 96 %
Brady Statistic AS VP Percent: 1 %
Brady Statistic AS VS Percent: 1 %
Brady Statistic RA Percent Paced: 99 %
Brady Statistic RV Percent Paced: 3.5 %
Date Time Interrogation Session: 20210805060010
Implantable Lead Implant Date: 20110901
Implantable Lead Implant Date: 20110901
Implantable Lead Location: 753859
Implantable Lead Location: 753860
Implantable Pulse Generator Implant Date: 20210426
Lead Channel Impedance Value: 360 Ohm
Lead Channel Impedance Value: 430 Ohm
Lead Channel Pacing Threshold Amplitude: 0.625 V
Lead Channel Pacing Threshold Amplitude: 1.875 V
Lead Channel Pacing Threshold Pulse Width: 0.5 ms
Lead Channel Pacing Threshold Pulse Width: 0.8 ms
Lead Channel Sensing Intrinsic Amplitude: 2.2 mV
Lead Channel Sensing Intrinsic Amplitude: 8.1 mV
Lead Channel Setting Pacing Amplitude: 1.625
Lead Channel Setting Pacing Amplitude: 2.125
Lead Channel Setting Pacing Pulse Width: 0.8 ms
Lead Channel Setting Sensing Sensitivity: 2 mV
Pulse Gen Model: 2272
Pulse Gen Serial Number: 3825617

## 2019-12-21 NOTE — Progress Notes (Signed)
Remote pacemaker transmission.   

## 2020-02-01 ENCOUNTER — Encounter: Payer: Medicare HMO | Admitting: Cardiovascular Disease

## 2020-02-14 ENCOUNTER — Other Ambulatory Visit: Payer: Self-pay | Admitting: Cardiovascular Disease

## 2020-03-17 ENCOUNTER — Ambulatory Visit (INDEPENDENT_AMBULATORY_CARE_PROVIDER_SITE_OTHER): Payer: Medicare HMO

## 2020-03-17 DIAGNOSIS — I495 Sick sinus syndrome: Secondary | ICD-10-CM | POA: Diagnosis not present

## 2020-03-17 LAB — CUP PACEART REMOTE DEVICE CHECK
Battery Remaining Longevity: 109 mo
Battery Remaining Percentage: 95.5 %
Battery Voltage: 3.01 V
Brady Statistic AP VP Percent: 4.3 %
Brady Statistic AP VS Percent: 96 %
Brady Statistic AS VP Percent: 1 %
Brady Statistic AS VS Percent: 1 %
Brady Statistic RA Percent Paced: 99 %
Brady Statistic RV Percent Paced: 4.3 %
Date Time Interrogation Session: 20211104040013
Implantable Lead Implant Date: 20110901
Implantable Lead Implant Date: 20110901
Implantable Lead Location: 753859
Implantable Lead Location: 753860
Implantable Pulse Generator Implant Date: 20210426
Lead Channel Impedance Value: 380 Ohm
Lead Channel Impedance Value: 430 Ohm
Lead Channel Pacing Threshold Amplitude: 0.5 V
Lead Channel Pacing Threshold Amplitude: 1.625 V
Lead Channel Pacing Threshold Pulse Width: 0.5 ms
Lead Channel Pacing Threshold Pulse Width: 0.8 ms
Lead Channel Sensing Intrinsic Amplitude: 3.1 mV
Lead Channel Sensing Intrinsic Amplitude: 6.1 mV
Lead Channel Setting Pacing Amplitude: 1.5 V
Lead Channel Setting Pacing Amplitude: 1.875
Lead Channel Setting Pacing Pulse Width: 0.8 ms
Lead Channel Setting Sensing Sensitivity: 2 mV
Pulse Gen Model: 2272
Pulse Gen Serial Number: 3825617

## 2020-03-17 NOTE — Progress Notes (Signed)
Remote pacemaker transmission.   

## 2020-05-11 ENCOUNTER — Encounter: Payer: Self-pay | Admitting: Cardiovascular Disease

## 2020-05-11 ENCOUNTER — Ambulatory Visit (INDEPENDENT_AMBULATORY_CARE_PROVIDER_SITE_OTHER): Payer: Medicare HMO | Admitting: Cardiovascular Disease

## 2020-05-11 ENCOUNTER — Other Ambulatory Visit: Payer: Self-pay

## 2020-05-11 VITALS — BP 102/65 | HR 62 | Ht 70.0 in | Wt 205.2 lb

## 2020-05-11 DIAGNOSIS — E663 Overweight: Secondary | ICD-10-CM

## 2020-05-11 DIAGNOSIS — I10 Essential (primary) hypertension: Secondary | ICD-10-CM | POA: Diagnosis not present

## 2020-05-11 DIAGNOSIS — Z950 Presence of cardiac pacemaker: Secondary | ICD-10-CM | POA: Diagnosis not present

## 2020-05-11 DIAGNOSIS — E7800 Pure hypercholesterolemia, unspecified: Secondary | ICD-10-CM

## 2020-05-11 DIAGNOSIS — I471 Supraventricular tachycardia: Secondary | ICD-10-CM

## 2020-05-11 DIAGNOSIS — I495 Sick sinus syndrome: Secondary | ICD-10-CM

## 2020-05-11 LAB — HM COLONOSCOPY

## 2020-05-11 NOTE — Progress Notes (Signed)
Cardiology Office Note    Date:  05/11/2020   ID:  Roy Tucker, DOB [birth year], MRN [id number]  PCP:  Marva Panda, NP  Cardiologist:   Thurmon Fair, MD   Chief Complaint  Patient presents with  . Pacemaker Problem    History of Present Illness:  Roy Tucker is a 69 y.o. male with symptomatic sinus node dysfunction returning for follow-up on his dual-chamber permanent pacemaker (St. Jude Accent implanted 2011, both [address] Jude 2088, generator change out in April 2021, St Jude Assurity). Additional medical problems include systemic hypertension, hyperlipidemia and treated hypothyroidism, rheumatoid arthritis.  He has done very well from a cardiovascular point of view and has no complaints.  He can do yard work and housework without difficulty.  He walks 2 miles a day 4 days a week at a minimum.  He is even lost about 4 pounds since the beginning of the year.  The patient specifically denies any chest pain at rest exertion, dyspnea at rest or with exertion, orthopnea, paroxysmal nocturnal dyspnea, syncope, palpitations, focal neurological deficits, intermittent claudication, lower extremity edema, unexplained weight gain, cough, hemoptysis or wheezing.  His device has recorded several brief episodes of mode switch which on evaluation all represent atrial oversensing.  Only 1 of these was recognized as such leading to noise reversion while all the other spells are categorized as episodes of atrial arrhythmia.  The atrial lead noise can be readily reproduced with isometric exercises, but not with pocket stimulation.  Increasing atrial sensitivity threshold to 1.5 mV lead to abolition of oversensing during isometric exercises, also providing a borderline acceptable sensing threshold of 1.8: 1.  There have been no episodes of true atrial fibrillation or any high ventricular rates.  He has slightly elevated ventricular capture thresholds, but he almost never requires ventricular  pacing.  He has virtually 100% atrial pacing and only 4% ventricular pacing.  He does have an underlying sinus bradycardia with 1: 1 AV conduction at about 40-44 bpm.  Estimated generator longevity is 8-10 years  Past Medical History:  Diagnosis Date  . Arthritis   . Atrial tachycardia ([facility])   . Bradycardia   . Chronic pain   . Dysrhythmia    bradycardia  . GERD (gastroesophageal reflux disease)   . Gout   . Hypertension   . Hyperthyroidism    graves on prednisone  . Pacemaker   . Pneumonia    few months ago  . Sinus node dysfunction ([facility]) 01/12/2010   St.Jude pacemaker    Past Surgical History:  Procedure Laterality Date  . COLONOSCOPY W/ POLYPECTOMY    . NM MYOCAR PERF WALL MOTION  07/05/2010   no significant ischemia  . PACEMAKER INSERTION  01/12/2010   St.Jude Accent DR RF  . PPM GENERATOR CHANGEOUT N/A 09/07/2019   Procedure: PPM GENERATOR CHANGEOUT;  Surgeon: Thurmon Fair, MD;  [facility];  Service: Cardiovascular;  Laterality: N/A;  . TOTAL KNEE ARTHROPLASTY  12/10/2011   Procedure: TOTAL KNEE ARTHROPLASTY;  Surgeon: Raymon Mutton, MD;  Location: [facility];  Service: Orthopedics;  Laterality: Right;  . TOTAL KNEE ARTHROPLASTY Left 03/30/2019   Procedure: TOTAL KNEE ARTHROPLASTY;  Surgeon: Dannielle Huh, MD;  Location: [facility];  Service: Orthopedics;  Laterality: Left;  75 mins needed for length of case    Current Medications: Outpatient Medications Prior to Visit  Medication Sig Dispense Refill  . ALPRAZolam (XANAX) 1 MG tablet Take 0.5 mg by mouth 2 (two) times  daily.    . atenolol (TENORMIN) 25 MG tablet TAKE 1/2 TABLET BY MOUTH EVERY DAY 45 tablet 3  . atorvastatin (LIPITOR) 20 MG tablet Take 20 mg by mouth daily.     Marland Kitchen. esomeprazole (NEXIUM) 40 MG capsule Take 1 capsule (40 mg total) by mouth 2 (two) times daily before a meal. (Patient taking differently: Take 40 mg by mouth 2 (two) times daily as needed (acid reflux/indigestion.).) 60 capsule 1  .  levothyroxine (SYNTHROID, LEVOTHROID) 175 MCG tablet Take 175 mcg by mouth daily before breakfast.    . loratadine (CLARITIN) 10 MG tablet Take 10 mg by mouth daily.  5  . meloxicam (MOBIC) 15 MG tablet Take 15 mg by mouth daily.    Marland Kitchen. oxyCODONE-acetaminophen (PERCOCET) 10-325 MG tablet Take 1 tablet by mouth every 6 (six) hours as needed for pain.     No facility-administered medications prior to visit.     Allergies:   Infliximab, Methimazole, Neurontin [gabapentin], Aspirin, and Tylenol [acetaminophen]   Social History   Socioeconomic History  . Marital status: Married    Spouse name: Not on file  . Number of children: Not on file  . Years of education: Not on file  . Highest education level: Not on file  Occupational History  . Not on file  Tobacco Use  . Smoking status: Former Smoker    Packs/day: 1.00    Years: 15.00    Pack years: 15.00    Types: Cigarettes    Quit date: 12/03/2003    Years since quitting: 16.4  . Smokeless tobacco: Never Used  Substance and Sexual Activity  . Alcohol use: No  . Drug use: No  . Sexual activity: Not Currently  Other Topics Concern  . Not on file  Social History Narrative  . Not on file   Social Determinants of Health   Financial Resource Strain: Not on file  Food Insecurity: Not on file  Transportation Needs: Not on file  Physical Activity: Not on file  Stress: Not on file  Social Connections: Not on file       ROS:   Please see the history of present illness.    ROS All other systems are reviewed and are negative.   PHYSICAL EXAM:   VS:  BP 102/65   Pulse 62   Ht 5\' 10"  (1.778 m)   Wt 205 lb 3.2 oz (93.1 kg)   SpO2 99%   BMI 29.44 kg/m      General: Alert, oriented x3, no distress, healthy left subclavian pacemaker site Head: no evidence of trauma, PERRL, EOMI, no exophtalmos or lid lag, no myxedema, no xanthelasma; normal ears, nose and oropharynx Neck: normal jugular venous pulsations and no hepatojugular  reflux; brisk carotid pulses without delay and no carotid bruits Chest: clear to auscultation, no signs of consolidation by percussion or palpation, normal fremitus, symmetrical and full respiratory excursions Cardiovascular: normal position and quality of the apical impulse, regular rhythm, normal first and second heart sounds, no murmurs, rubs or gallops Abdomen: no tenderness or distention, no masses by palpation, no abnormal pulsatility or arterial bruits, normal bowel sounds, no hepatosplenomegaly Extremities: no clubbing, cyanosis or edema; 2+ radial, ulnar and brachial pulses bilaterally; 2+ right femoral, posterior tibial and dorsalis pedis pulses; 2+ left femoral, posterior tibial and dorsalis pedis pulses; no subclavian or femoral bruits Neurological: grossly nonfocal Psych: Normal mood and affect  Wt Readings from Last 3 Encounters:  05/11/20 205 lb 3.2 oz (93.1 kg)  09/07/19  207 lb (93.9 kg)  05/25/19 209 lb (94.8 kg)      Studies/Labs Reviewed:   EKG:  EKG is ordered today.  It shows atrial paced, ventricular sensed rhythm and is otherwise completely normal  Recent Labs: 09/03/2019: BUN 14; Creatinine, Ser 0.98; Hemoglobin 14.3; Platelets 132; Potassium 5.2; Sodium 146   Lipid Panel    Component Value Date/Time   CHOL 156 01/30/2016 1004   TRIG 112 01/30/2016 1004   HDL 47 01/30/2016 1004   CHOLHDL 3.3 01/30/2016 1004   VLDL 22 01/30/2016 1004   LDLCALC 87 01/30/2016 1004   He reports that he had a repeat lipid profile with Mayra Neer couple of months ago, but this is not currently available for review.  ASSESSMENT:    1. SSS (sick sinus syndrome) ([facility])   2. Pacemaker   3. PAT (paroxysmal atrial tachycardia) ([facility])   4. Essential hypertension   5. Hypercholesterolemia   6. Overweight      PLAN:  In order of problems listed above:  1. SSS: 100% atrial paced rhythm, normal AV conduction.  Appropriate heart rate histogram with current device  settings.. 2. PPM: Recent generator change out.  Has some mild "noise" on the atrial 2088-lead, but was able to avoid oversensing by adjusting the atrial lead sensitivity threshold.  This has not led to significant interference with normal device function.  Ventricular lead threshold is a little bit high, but he almost never requires ventricular pacing. 3. PAT: None seen on recent pacemaker downloads.  All the episodes recorded are artifactual, due to atrial oversensing.  No treatment needed. 4. HTN: Very well controlled.  In fact his blood pressure is quite low today.  We will stop the atenolol if he develops symptomatic hypotension. 5. HLP: On statin.  We will get a copy of his labs from his PCP. 6. Obesity: Congratulated on weight loss, he is no longer in obesity range. Hypersomnolence seems to have improved.  He never had the home sleep study.  Encouraged him to keep trying to lose weight.   Medication Adjustments/Labs and Tests Ordered: Current medicines are reviewed at length with the patient today.  Concerns regarding medicines are outlined above.  Medication changes, Labs and Tests ordered today are listed in the Patient Instructions below. Patient Instructions  Medication Instructions:  No changes *If you need a refill on your cardiac medications before your next appointment, please call your pharmacy*   Lab Work: None ordered If you have labs (blood work) drawn today and your tests are completely normal, you will receive your results only by: Marland Kitchen MyChart Message (if you have MyChart) OR . A paper copy in the mail If you have any lab test that is abnormal or we need to change your treatment, we will call you to review the results.   Testing/Procedures: None ordered   Follow-Up: At [facility], you and your health needs are our priority.  As part of our continuing mission to provide you with exceptional heart care, we have created designated Provider Care Teams.  These Care  Teams include your primary Cardiologist (physician) and Advanced Practice Providers (APPs -  Physician Assistants and Nurse Practitioners) who all work together to provide you with the care you need, when you need it.  We recommend signing up for the patient portal called "MyChart".  Sign up information is provided on this After Visit Summary.  MyChart is used to connect with patients for Virtual Visits (Telemedicine).  Patients are able to view  lab/test results, encounter notes, upcoming appointments, etc.  Non-urgent messages can be sent to your provider as well.   To learn more about what you can do with MyChart, go to [email or web address].    Your next appointment:   12 month(s)  The format for your next appointment:   In Person  Provider:   Thurmon Fair, MD      Signed, Thurmon Fair, MD  05/11/2020 9:53 PM    [facility] [address] [place], [place], [place]  [address] Phone: [phone]; Fax: [phone]

## 2020-05-11 NOTE — Patient Instructions (Signed)

## 2020-06-16 ENCOUNTER — Ambulatory Visit (INDEPENDENT_AMBULATORY_CARE_PROVIDER_SITE_OTHER): Payer: Medicare HMO

## 2020-06-16 DIAGNOSIS — I495 Sick sinus syndrome: Secondary | ICD-10-CM | POA: Diagnosis not present

## 2020-06-16 LAB — CUP PACEART REMOTE DEVICE CHECK
Battery Remaining Longevity: 104 mo
Battery Remaining Percentage: 95.5 %
Battery Voltage: 3.01 V
Brady Statistic AP VP Percent: 3.4 %
Brady Statistic AP VS Percent: 97 %
Brady Statistic AS VP Percent: 1 %
Brady Statistic AS VS Percent: 1 %
Brady Statistic RA Percent Paced: 99 %
Brady Statistic RV Percent Paced: 3.4 %
Date Time Interrogation Session: 20220203062607
Implantable Lead Implant Date: 20110901
Implantable Lead Implant Date: 20110901
Implantable Lead Location: 753859
Implantable Lead Location: 753860
Implantable Pulse Generator Implant Date: 20210426
Lead Channel Impedance Value: 380 Ohm
Lead Channel Impedance Value: 400 Ohm
Lead Channel Pacing Threshold Amplitude: 0.625 V
Lead Channel Pacing Threshold Amplitude: 1.875 V
Lead Channel Pacing Threshold Pulse Width: 0.5 ms
Lead Channel Pacing Threshold Pulse Width: 0.8 ms
Lead Channel Sensing Intrinsic Amplitude: 3 mV
Lead Channel Sensing Intrinsic Amplitude: 6 mV
Lead Channel Setting Pacing Amplitude: 1.625
Lead Channel Setting Pacing Amplitude: 2.125
Lead Channel Setting Pacing Pulse Width: 0.8 ms
Lead Channel Setting Sensing Sensitivity: 2 mV
Pulse Gen Model: 2272
Pulse Gen Serial Number: 3825617

## 2020-06-22 NOTE — Progress Notes (Signed)
Remote pacemaker transmission.   

## 2020-09-15 ENCOUNTER — Ambulatory Visit (INDEPENDENT_AMBULATORY_CARE_PROVIDER_SITE_OTHER): Payer: Medicare HMO

## 2020-09-15 DIAGNOSIS — I495 Sick sinus syndrome: Secondary | ICD-10-CM

## 2020-09-15 LAB — CUP PACEART REMOTE DEVICE CHECK
Battery Remaining Longevity: 105 mo
Battery Remaining Percentage: 95.5 %
Battery Voltage: 3.01 V
Brady Statistic AP VP Percent: 3.6 %
Brady Statistic AP VS Percent: 96 %
Brady Statistic AS VP Percent: 1 %
Brady Statistic AS VS Percent: 1 %
Brady Statistic RA Percent Paced: 99 %
Brady Statistic RV Percent Paced: 3.6 %
Date Time Interrogation Session: 20220505040016
Implantable Lead Implant Date: 20110901
Implantable Lead Implant Date: 20110901
Implantable Lead Location: 753859
Implantable Lead Location: 753860
Implantable Pulse Generator Implant Date: 20210426
Lead Channel Impedance Value: 390 Ohm
Lead Channel Impedance Value: 440 Ohm
Lead Channel Pacing Threshold Amplitude: 0.625 V
Lead Channel Pacing Threshold Amplitude: 1.625 V
Lead Channel Pacing Threshold Pulse Width: 0.5 ms
Lead Channel Pacing Threshold Pulse Width: 0.8 ms
Lead Channel Sensing Intrinsic Amplitude: 2.2 mV
Lead Channel Sensing Intrinsic Amplitude: 6.9 mV
Lead Channel Setting Pacing Amplitude: 1.625
Lead Channel Setting Pacing Amplitude: 1.875
Lead Channel Setting Pacing Pulse Width: 0.8 ms
Lead Channel Setting Sensing Sensitivity: 2 mV
Pulse Gen Model: 2272
Pulse Gen Serial Number: 3825617

## 2020-10-06 NOTE — Progress Notes (Signed)
Remote pacemaker transmission.   

## 2020-10-31 ENCOUNTER — Other Ambulatory Visit: Payer: Self-pay | Admitting: Cardiology

## 2020-12-15 ENCOUNTER — Ambulatory Visit (INDEPENDENT_AMBULATORY_CARE_PROVIDER_SITE_OTHER): Payer: Medicare HMO

## 2020-12-15 DIAGNOSIS — I495 Sick sinus syndrome: Secondary | ICD-10-CM

## 2021-01-03 LAB — CUP PACEART REMOTE DEVICE CHECK
Battery Remaining Longevity: 99 mo
Battery Remaining Percentage: 91 %
Battery Voltage: 3.01 V
Brady Statistic AP VP Percent: 3.7 %
Brady Statistic AP VS Percent: 96 %
Brady Statistic AS VP Percent: 1 %
Brady Statistic AS VS Percent: 1 %
Brady Statistic RA Percent Paced: 99 %
Brady Statistic RV Percent Paced: 3.7 %
Date Time Interrogation Session: 20220804040019
Implantable Lead Implant Date: 20110901
Implantable Lead Implant Date: 20110901
Implantable Lead Location: 753859
Implantable Lead Location: 753860
Implantable Pulse Generator Implant Date: 20210426
Lead Channel Impedance Value: 350 Ohm
Lead Channel Impedance Value: 390 Ohm
Lead Channel Pacing Threshold Amplitude: 0.625 V
Lead Channel Pacing Threshold Amplitude: 1.25 V
Lead Channel Pacing Threshold Pulse Width: 0.5 ms
Lead Channel Pacing Threshold Pulse Width: 0.8 ms
Lead Channel Sensing Intrinsic Amplitude: 2.4 mV
Lead Channel Sensing Intrinsic Amplitude: 7 mV
Lead Channel Setting Pacing Amplitude: 1.5 V
Lead Channel Setting Pacing Amplitude: 1.625
Lead Channel Setting Pacing Pulse Width: 0.8 ms
Lead Channel Setting Sensing Sensitivity: 2 mV
Pulse Gen Model: 2272
Pulse Gen Serial Number: 3825617

## 2021-01-09 NOTE — Progress Notes (Signed)
Remote pacemaker transmission.   

## 2021-03-16 ENCOUNTER — Ambulatory Visit (INDEPENDENT_AMBULATORY_CARE_PROVIDER_SITE_OTHER): Payer: Medicare HMO

## 2021-03-16 DIAGNOSIS — I495 Sick sinus syndrome: Secondary | ICD-10-CM | POA: Diagnosis not present

## 2021-03-17 LAB — CUP PACEART REMOTE DEVICE CHECK
Battery Remaining Longevity: 97 mo
Battery Remaining Percentage: 88 %
Battery Voltage: 3.01 V
Brady Statistic AP VP Percent: 3.5 %
Brady Statistic AP VS Percent: 96 %
Brady Statistic AS VP Percent: 1 %
Brady Statistic AS VS Percent: 1 %
Brady Statistic RA Percent Paced: 99 %
Brady Statistic RV Percent Paced: 3.5 %
Date Time Interrogation Session: 20221103040015
Implantable Lead Implant Date: 20110901
Implantable Lead Implant Date: 20110901
Implantable Lead Location: 753859
Implantable Lead Location: 753860
Implantable Pulse Generator Implant Date: 20210426
Lead Channel Impedance Value: 350 Ohm
Lead Channel Impedance Value: 430 Ohm
Lead Channel Pacing Threshold Amplitude: 0.5 V
Lead Channel Pacing Threshold Amplitude: 1.25 V
Lead Channel Pacing Threshold Pulse Width: 0.5 ms
Lead Channel Pacing Threshold Pulse Width: 0.8 ms
Lead Channel Sensing Intrinsic Amplitude: 3.2 mV
Lead Channel Sensing Intrinsic Amplitude: 7.3 mV
Lead Channel Setting Pacing Amplitude: 1.5 V
Lead Channel Setting Pacing Amplitude: 1.5 V
Lead Channel Setting Pacing Pulse Width: 0.8 ms
Lead Channel Setting Sensing Sensitivity: 2 mV
Pulse Gen Model: 2272
Pulse Gen Serial Number: 3825617

## 2021-03-22 NOTE — Progress Notes (Signed)
Remote pacemaker transmission.   

## 2021-05-05 ENCOUNTER — Telehealth: Payer: Self-pay | Admitting: Cardiovascular Disease

## 2021-05-05 NOTE — Telephone Encounter (Signed)
left shoulder down into forearm hurts ... pt states that he has not seen pcp but is wanting to know further advice

## 2021-05-05 NOTE — Telephone Encounter (Signed)
Spoke to pt. He report for the past month he's been experiencing on and off left shoulder pain that radiates down his arm. He denies chest pain, sob, or any other symptoms but state symptoms are worse when he try to move his left arm backwards.  Nurse recommended pt contact pcp but will always forward to MD for any further recommendations.

## 2021-05-05 NOTE — Telephone Encounter (Signed)
Pt updated and verbalized understanding.    Croitoru, Rachelle Hora, MD  You; Sandi Mariscal, RN 3 minutes ago (12:34 PM)   Agree. Sounds musculoskeletal

## 2021-05-26 ENCOUNTER — Encounter (HOSPITAL_COMMUNITY): Payer: Self-pay | Admitting: *Deleted

## 2021-05-26 ENCOUNTER — Other Ambulatory Visit: Payer: Self-pay

## 2021-05-26 ENCOUNTER — Emergency Department (HOSPITAL_COMMUNITY): Payer: Medicare HMO

## 2021-05-26 ENCOUNTER — Emergency Department (HOSPITAL_COMMUNITY)
Admission: EM | Admit: 2021-05-26 | Discharge: 2021-05-27 | Disposition: A | Discharge status: Home / Self Care | Payer: Medicare HMO | Attending: Emergency Medicine | Admitting: Emergency Medicine

## 2021-05-26 DIAGNOSIS — R079 Chest pain, unspecified: Secondary | ICD-10-CM | POA: Diagnosis present

## 2021-05-26 DIAGNOSIS — R059 Cough, unspecified: Secondary | ICD-10-CM | POA: Insufficient documentation

## 2021-05-26 DIAGNOSIS — R5383 Other fatigue: Secondary | ICD-10-CM | POA: Insufficient documentation

## 2021-05-26 DIAGNOSIS — R0602 Shortness of breath: Secondary | ICD-10-CM | POA: Insufficient documentation

## 2021-05-26 DIAGNOSIS — Z5321 Procedure and treatment not carried out due to patient leaving prior to being seen by health care provider: Secondary | ICD-10-CM | POA: Insufficient documentation

## 2021-05-26 LAB — CBC
HCT: 42.4 % (ref 39.0–52.0)
Hemoglobin: 14.6 g/dL (ref 13.0–17.0)
MCH: 29.7 pg (ref 26.0–34.0)
MCHC: 34.4 g/dL (ref 30.0–36.0)
MCV: 86.4 fL (ref 80.0–100.0)
Platelets: 147 10*3/uL — ABNORMAL LOW (ref 150–400)
RBC: 4.91 MIL/uL (ref 4.22–5.81)
RDW: 13.1 % (ref 11.5–15.5)
WBC: 5.5 10*3/uL (ref 4.0–10.5)
nRBC: 0 % (ref 0.0–0.2)

## 2021-05-26 LAB — BASIC METABOLIC PANEL
Anion gap: 11 (ref 5–15)
BUN: 14 mg/dL (ref 8–23)
CO2: 24 mmol/L (ref 22–32)
Calcium: 9.7 mg/dL (ref 8.9–10.3)
Chloride: 103 mmol/L (ref 98–111)
Creatinine, Ser: 0.95 mg/dL (ref 0.61–1.24)
GFR, Estimated: >60 mL/min (ref >60)
Glucose, Bld: 149 mg/dL — ABNORMAL HIGH (ref 70–99)
Potassium: 5.1 mmol/L (ref 3.5–5.1)
Sodium: 138 mmol/L (ref 135–145)

## 2021-05-26 LAB — TROPONIN I (HIGH SENSITIVITY)
Troponin I (High Sensitivity): 4 ng/L (ref <18)
Troponin I (High Sensitivity): 3 ng/L (ref <18)

## 2021-05-26 NOTE — ED Notes (Signed)
Called 3x for vitals, no answer 

## 2021-05-26 NOTE — ED Triage Notes (Signed)
The pt is c/o pain and numbness in his chest and lt arm  from earlier today  also sob   dry mouth  he has had oxycodone 1-2 hours.. he very vague about his symptoms  he reports that he just feel numb in his chest now

## 2021-05-26 NOTE — ED Provider Triage Note (Signed)
Emergency Medicine Provider Triage Evaluation Note  Roy Tucker , a 71 y.o. male  was evaluated in triage.  Pt complains of "feels like I got hit with a hammer in the chest" CP started around 3:45 States some radiation to left arm. Dry mouth as well.   No diaphoresis or vomiting    SOB and fatigued w some nausea  Review of Systems  Positive: CP, nausea, SOB  Negative: Fever  Physical Exam  BP 138/77 (BP Location: Left Arm)    Pulse (!) 59    Temp 98.2 F (36.8 C) (Oral)    Resp 18    SpO2 100%  Gen:   Awake, no distress   Resp:  Normal effort  MSK:   Moves extremities without difficulty  Other:  RRR bradycardic  Medical Decision Making  Medically screening exam initiated at 4:21 PM.  Appropriate orders placed.  Roy Tucker was informed that the remainder of the evaluation will be completed by another provider, this initial triage assessment does not replace that evaluation, and the importance of remaining in the ED until their evaluation is complete.  CP workup.   Solon Augusta [place], [place] 05/26/21 1624

## 2021-05-27 NOTE — ED Notes (Signed)
PT called for vitals X2 with no response

## 2021-06-15 ENCOUNTER — Ambulatory Visit (INDEPENDENT_AMBULATORY_CARE_PROVIDER_SITE_OTHER): Payer: Medicare HMO

## 2021-06-15 DIAGNOSIS — I495 Sick sinus syndrome: Secondary | ICD-10-CM

## 2021-06-15 LAB — CUP PACEART REMOTE DEVICE CHECK
Battery Remaining Longevity: 95 mo
Battery Remaining Percentage: 86 %
Battery Voltage: 3.01 V
Brady Statistic AP VP Percent: 3.2 %
Brady Statistic AP VS Percent: 97 %
Brady Statistic AS VP Percent: 1 %
Brady Statistic AS VS Percent: 1 %
Brady Statistic RA Percent Paced: 99 %
Brady Statistic RV Percent Paced: 3.2 %
Date Time Interrogation Session: 20230202040015
Implantable Lead Implant Date: 20110901
Implantable Lead Implant Date: 20110901
Implantable Lead Location: 753859
Implantable Lead Location: 753860
Implantable Pulse Generator Implant Date: 20210426
Lead Channel Impedance Value: 350 Ohm
Lead Channel Impedance Value: 430 Ohm
Lead Channel Pacing Threshold Amplitude: 0.625 V
Lead Channel Pacing Threshold Amplitude: 1.25 V
Lead Channel Pacing Threshold Pulse Width: 0.5 ms
Lead Channel Pacing Threshold Pulse Width: 0.8 ms
Lead Channel Sensing Intrinsic Amplitude: 2.3 mV
Lead Channel Sensing Intrinsic Amplitude: 7.5 mV
Lead Channel Setting Pacing Amplitude: 1.5 V
Lead Channel Setting Pacing Amplitude: 1.625
Lead Channel Setting Pacing Pulse Width: 0.8 ms
Lead Channel Setting Sensing Sensitivity: 2 mV
Pulse Gen Model: 2272
Pulse Gen Serial Number: 3825617

## 2021-06-21 NOTE — Progress Notes (Signed)
Remote pacemaker transmission.   

## 2021-09-14 ENCOUNTER — Ambulatory Visit: Payer: Medicare HMO

## 2021-10-20 LAB — CUP PACEART REMOTE DEVICE CHECK
Battery Remaining Longevity: 90 mo
Battery Remaining Percentage: 83 %
Battery Voltage: 3.01 V
Brady Statistic AP VP Percent: 2.8 %
Brady Statistic AP VS Percent: 97 %
Brady Statistic AS VP Percent: 1 %
Brady Statistic AS VS Percent: 1 %
Brady Statistic RA Percent Paced: 99 %
Brady Statistic RV Percent Paced: 2.8 %
Date Time Interrogation Session: 20230504040012
Implantable Lead Implant Date: 20110901
Implantable Lead Implant Date: 20110901
Implantable Lead Location: 753859
Implantable Lead Location: 753860
Implantable Pulse Generator Implant Date: 20210426
Lead Channel Impedance Value: 310 Ohm
Lead Channel Impedance Value: 390 Ohm
Lead Channel Pacing Threshold Amplitude: 0.5 V
Lead Channel Pacing Threshold Amplitude: 1.375 V
Lead Channel Pacing Threshold Pulse Width: 0.5 ms
Lead Channel Pacing Threshold Pulse Width: 0.8 ms
Lead Channel Sensing Intrinsic Amplitude: 2.9 mV
Lead Channel Sensing Intrinsic Amplitude: 5.2 mV
Lead Channel Setting Pacing Amplitude: 1.5 V
Lead Channel Setting Pacing Amplitude: 1.625
Lead Channel Setting Pacing Pulse Width: 0.8 ms
Lead Channel Setting Sensing Sensitivity: 2 mV
Pulse Gen Model: 2272
Pulse Gen Serial Number: 3825617

## 2021-11-09 ENCOUNTER — Other Ambulatory Visit: Payer: Self-pay | Admitting: Cardiology

## 2021-12-14 ENCOUNTER — Ambulatory Visit (INDEPENDENT_AMBULATORY_CARE_PROVIDER_SITE_OTHER): Payer: Medicare HMO

## 2021-12-14 DIAGNOSIS — I495 Sick sinus syndrome: Secondary | ICD-10-CM

## 2021-12-19 LAB — CUP PACEART REMOTE DEVICE CHECK
Battery Remaining Longevity: 87 mo
Battery Remaining Percentage: 81 %
Battery Voltage: 3.01 V
Brady Statistic AP VP Percent: 2.6 %
Brady Statistic AP VS Percent: 97 %
Brady Statistic AS VP Percent: 1 %
Brady Statistic AS VS Percent: 1 %
Brady Statistic RA Percent Paced: 99 %
Brady Statistic RV Percent Paced: 2.6 %
Date Time Interrogation Session: 20230803054024
Implantable Lead Implant Date: 20110901
Implantable Lead Implant Date: 20110901
Implantable Lead Location: 753859
Implantable Lead Location: 753860
Implantable Pulse Generator Implant Date: 20210426
Lead Channel Impedance Value: 330 Ohm
Lead Channel Impedance Value: 440 Ohm
Lead Channel Pacing Threshold Amplitude: 0.5 V
Lead Channel Pacing Threshold Amplitude: 1.625 V
Lead Channel Pacing Threshold Pulse Width: 0.5 ms
Lead Channel Pacing Threshold Pulse Width: 0.8 ms
Lead Channel Sensing Intrinsic Amplitude: 3.2 mV
Lead Channel Sensing Intrinsic Amplitude: 5.2 mV
Lead Channel Setting Pacing Amplitude: 1.5 V
Lead Channel Setting Pacing Amplitude: 1.875
Lead Channel Setting Pacing Pulse Width: 0.8 ms
Lead Channel Setting Sensing Sensitivity: 2 mV
Pulse Gen Model: 2272
Pulse Gen Serial Number: 3825617

## 2022-01-05 NOTE — Progress Notes (Signed)
Remote pacemaker transmission.   

## 2022-01-10 ENCOUNTER — Other Ambulatory Visit: Payer: Self-pay | Admitting: Cardiology

## 2022-03-15 ENCOUNTER — Ambulatory Visit (INDEPENDENT_AMBULATORY_CARE_PROVIDER_SITE_OTHER): Payer: Medicare HMO

## 2022-03-15 DIAGNOSIS — I495 Sick sinus syndrome: Secondary | ICD-10-CM | POA: Diagnosis not present

## 2022-03-19 LAB — CUP PACEART REMOTE DEVICE CHECK
Battery Remaining Longevity: 85 mo
Battery Remaining Percentage: 78 %
Battery Voltage: 3.01 V
Brady Statistic AP VP Percent: 2.4 %
Brady Statistic AP VS Percent: 97 %
Brady Statistic AS VP Percent: 1 %
Brady Statistic AS VS Percent: 1 %
Brady Statistic RA Percent Paced: 99 %
Brady Statistic RV Percent Paced: 2.4 %
Date Time Interrogation Session: 20231102042102
Implantable Lead Connection Status: 753985
Implantable Lead Connection Status: 753985
Implantable Lead Implant Date: 20110901
Implantable Lead Implant Date: 20110901
Implantable Lead Location: 753859
Implantable Lead Location: 753860
Implantable Pulse Generator Implant Date: 20210426
Lead Channel Impedance Value: 340 Ohm
Lead Channel Impedance Value: 450 Ohm
Lead Channel Pacing Threshold Amplitude: 0.5 V
Lead Channel Pacing Threshold Amplitude: 1.625 V
Lead Channel Pacing Threshold Pulse Width: 0.5 ms
Lead Channel Pacing Threshold Pulse Width: 0.8 ms
Lead Channel Sensing Intrinsic Amplitude: 2.6 mV
Lead Channel Sensing Intrinsic Amplitude: 4.7 mV
Lead Channel Setting Pacing Amplitude: 1.5 V
Lead Channel Setting Pacing Amplitude: 1.875
Lead Channel Setting Pacing Pulse Width: 0.8 ms
Lead Channel Setting Sensing Sensitivity: 2 mV
Pulse Gen Model: 2272
Pulse Gen Serial Number: 3825617

## 2022-03-27 NOTE — Progress Notes (Signed)
Remote pacemaker transmission.   

## 2022-06-14 ENCOUNTER — Ambulatory Visit (INDEPENDENT_AMBULATORY_CARE_PROVIDER_SITE_OTHER): Payer: Medicare HMO

## 2022-06-14 DIAGNOSIS — I495 Sick sinus syndrome: Secondary | ICD-10-CM | POA: Diagnosis not present

## 2022-06-14 LAB — CUP PACEART REMOTE DEVICE CHECK
Battery Remaining Longevity: 82 mo
Battery Remaining Percentage: 76 %
Battery Voltage: 3.01 V
Brady Statistic AP VP Percent: 2.2 %
Brady Statistic AP VS Percent: 97 %
Brady Statistic AS VP Percent: 1 %
Brady Statistic AS VS Percent: 1 %
Brady Statistic RA Percent Paced: 99 %
Brady Statistic RV Percent Paced: 2.2 %
Date Time Interrogation Session: 20240201040013
Implantable Lead Connection Status: 753985
Implantable Lead Connection Status: 753985
Implantable Lead Implant Date: 20110901
Implantable Lead Implant Date: 20110901
Implantable Lead Location: 753859
Implantable Lead Location: 753860
Implantable Pulse Generator Implant Date: 20210426
Lead Channel Impedance Value: 330 Ohm
Lead Channel Impedance Value: 430 Ohm
Lead Channel Pacing Threshold Amplitude: 0.5 V
Lead Channel Pacing Threshold Amplitude: 1.5 V
Lead Channel Pacing Threshold Pulse Width: 0.5 ms
Lead Channel Pacing Threshold Pulse Width: 0.8 ms
Lead Channel Sensing Intrinsic Amplitude: 3.1 mV
Lead Channel Sensing Intrinsic Amplitude: 4.2 mV
Lead Channel Setting Pacing Amplitude: 1.5 V
Lead Channel Setting Pacing Amplitude: 1.75 V
Lead Channel Setting Pacing Pulse Width: 0.8 ms
Lead Channel Setting Sensing Sensitivity: 2 mV
Pulse Gen Model: 2272
Pulse Gen Serial Number: 3825617

## 2022-07-05 NOTE — Progress Notes (Signed)
Remote pacemaker transmission.   

## 2022-07-07 ENCOUNTER — Other Ambulatory Visit: Payer: Self-pay | Admitting: Cardiology

## 2022-09-13 ENCOUNTER — Ambulatory Visit (INDEPENDENT_AMBULATORY_CARE_PROVIDER_SITE_OTHER): Payer: Medicare HMO

## 2022-09-13 DIAGNOSIS — I495 Sick sinus syndrome: Secondary | ICD-10-CM

## 2022-09-13 LAB — CUP PACEART REMOTE DEVICE CHECK
Battery Remaining Longevity: 79 mo
Battery Remaining Percentage: 73 %
Battery Voltage: 3.01 V
Brady Statistic AP VP Percent: 2.1 %
Brady Statistic AP VS Percent: 98 %
Brady Statistic AS VP Percent: 1 %
Brady Statistic AS VS Percent: 1 %
Brady Statistic RA Percent Paced: 99 %
Brady Statistic RV Percent Paced: 2.1 %
Date Time Interrogation Session: 20240502040014
Implantable Lead Connection Status: 753985
Implantable Lead Connection Status: 753985
Implantable Lead Implant Date: 20110901
Implantable Lead Implant Date: 20110901
Implantable Lead Location: 753859
Implantable Lead Location: 753860
Implantable Pulse Generator Implant Date: 20210426
Lead Channel Impedance Value: 300 Ohm
Lead Channel Impedance Value: 390 Ohm
Lead Channel Pacing Threshold Amplitude: 0.625 V
Lead Channel Pacing Threshold Amplitude: 1.5 V
Lead Channel Pacing Threshold Pulse Width: 0.5 ms
Lead Channel Pacing Threshold Pulse Width: 0.8 ms
Lead Channel Sensing Intrinsic Amplitude: 2.6 mV
Lead Channel Sensing Intrinsic Amplitude: 4.6 mV
Lead Channel Setting Pacing Amplitude: 1.625
Lead Channel Setting Pacing Amplitude: 1.75 V
Lead Channel Setting Pacing Pulse Width: 0.8 ms
Lead Channel Setting Sensing Sensitivity: 2 mV
Pulse Gen Model: 2272
Pulse Gen Serial Number: 3825617

## 2022-10-03 NOTE — Progress Notes (Signed)
Remote pacemaker transmission.   

## 2022-12-12 LAB — LAB REPORT - SCANNED
A1c: 5.8
Calcium: 9.3
Creatinine, POC: 175 mg/dL
EGFR: 66
Free T4: 1.52 ng/dL
HM Hepatitis Screen: NEGATIVE
PTH: 86.9
TSH: 0.64 (ref 0.41–5.90)

## 2022-12-13 ENCOUNTER — Ambulatory Visit (INDEPENDENT_AMBULATORY_CARE_PROVIDER_SITE_OTHER): Payer: Medicare HMO

## 2022-12-13 DIAGNOSIS — I495 Sick sinus syndrome: Secondary | ICD-10-CM | POA: Diagnosis not present

## 2022-12-13 LAB — CUP PACEART REMOTE DEVICE CHECK
Battery Remaining Longevity: 74 mo
Battery Remaining Percentage: 71 %
Battery Voltage: 3.01 V
Brady Statistic AP VP Percent: 2.1 %
Brady Statistic AP VS Percent: 98 %
Brady Statistic AS VP Percent: 1 %
Brady Statistic AS VS Percent: 1 %
Brady Statistic RA Percent Paced: 99 %
Brady Statistic RV Percent Paced: 2.1 %
Date Time Interrogation Session: 20240801040017
Implantable Lead Connection Status: 753985
Implantable Lead Connection Status: 753985
Implantable Lead Implant Date: 20110901
Implantable Lead Implant Date: 20110901
Implantable Lead Location: 753859
Implantable Lead Location: 753860
Implantable Pulse Generator Implant Date: 20210426
Lead Channel Impedance Value: 310 Ohm
Lead Channel Impedance Value: 390 Ohm
Lead Channel Pacing Threshold Amplitude: 0.625 V
Lead Channel Pacing Threshold Amplitude: 1.5 V
Lead Channel Pacing Threshold Pulse Width: 0.5 ms
Lead Channel Pacing Threshold Pulse Width: 0.8 ms
Lead Channel Sensing Intrinsic Amplitude: 3.1 mV
Lead Channel Sensing Intrinsic Amplitude: 6.3 mV
Lead Channel Setting Pacing Amplitude: 1.625
Lead Channel Setting Pacing Amplitude: 1.75 V
Lead Channel Setting Pacing Pulse Width: 0.8 ms
Lead Channel Setting Sensing Sensitivity: 2 mV
Pulse Gen Model: 2272
Pulse Gen Serial Number: 3825617

## 2022-12-26 NOTE — Progress Notes (Signed)
Remote pacemaker transmission.   

## 2023-03-14 ENCOUNTER — Ambulatory Visit (INDEPENDENT_AMBULATORY_CARE_PROVIDER_SITE_OTHER): Payer: Medicare HMO

## 2023-03-14 DIAGNOSIS — I495 Sick sinus syndrome: Secondary | ICD-10-CM | POA: Diagnosis not present

## 2023-03-14 LAB — CUP PACEART REMOTE DEVICE CHECK
Battery Remaining Longevity: 72 mo
Battery Remaining Percentage: 68 %
Battery Voltage: 3.01 V
Brady Statistic AP VP Percent: 2.1 %
Brady Statistic AP VS Percent: 98 %
Brady Statistic AS VP Percent: 1 %
Brady Statistic AS VS Percent: 1 %
Brady Statistic RA Percent Paced: 99 %
Brady Statistic RV Percent Paced: 2.1 %
Date Time Interrogation Session: 20241031040014
Implantable Lead Connection Status: 753985
Implantable Lead Connection Status: 753985
Implantable Lead Implant Date: 20110901
Implantable Lead Implant Date: 20110901
Implantable Lead Location: 753859
Implantable Lead Location: 753860
Implantable Pulse Generator Implant Date: 20210426
Lead Channel Impedance Value: 310 Ohm
Lead Channel Impedance Value: 390 Ohm
Lead Channel Pacing Threshold Amplitude: 0.625 V
Lead Channel Pacing Threshold Amplitude: 1.5 V
Lead Channel Pacing Threshold Pulse Width: 0.5 ms
Lead Channel Pacing Threshold Pulse Width: 0.8 ms
Lead Channel Sensing Intrinsic Amplitude: 2.6 mV
Lead Channel Sensing Intrinsic Amplitude: 6.3 mV
Lead Channel Setting Pacing Amplitude: 1.625
Lead Channel Setting Pacing Amplitude: 1.75 V
Lead Channel Setting Pacing Pulse Width: 0.8 ms
Lead Channel Setting Sensing Sensitivity: 2 mV
Pulse Gen Model: 2272
Pulse Gen Serial Number: 3825617

## 2023-04-01 NOTE — Progress Notes (Signed)
Remote pacemaker transmission.   

## 2023-04-05 ENCOUNTER — Telehealth: Payer: Self-pay

## 2023-04-05 NOTE — Telephone Encounter (Signed)
Patient identification verified by 2 forms. Marilynn Rail, RN    Called and spoke to patients wife Dorena Bodo states:   -patient was started on Cymbalta 30mg    -wants to check if Cymbalta is safe for him to take based on pace maker and cardiac hx   -He took one dose on 11/20 and did not feel right   -after taking first dose, felt like heart was beating too fast   -Felt numbness around chest area   -he does not know how to explain how he felt, just did not feel right   -he developed diarrhea due to Cymbalta   -symptoms have resolved, feels fine at this time  Patient denies:   -chest pain  Informed patient message sent to Dr. Royann Shivers for input/advisement  Patient has no further questions at this time

## 2023-04-06 NOTE — Telephone Encounter (Signed)
Cymbalta can increase blood pressure and cause heart rhythm abnormalities.  The side effects are not common, but can occur in as many as 10% of people who take this medication.  I would check with the prescribing physician to see if there is an alternative medication he could use.

## 2023-04-08 NOTE — Telephone Encounter (Signed)
Patient identification verified by 2 forms. Marilynn Rail, RN    Called and spoke to patients wife Annice Needy provider message below  RN encourage nancy to contact prescribing provider today  Harriett Sine verbalized understanding, no questions at this time

## 2023-06-13 ENCOUNTER — Ambulatory Visit: Payer: Medicare HMO

## 2023-06-13 DIAGNOSIS — I495 Sick sinus syndrome: Secondary | ICD-10-CM | POA: Diagnosis not present

## 2023-06-13 LAB — CUP PACEART REMOTE DEVICE CHECK
Battery Remaining Longevity: 57 mo
Battery Remaining Percentage: 66 %
Battery Voltage: 2.99 V
Brady Statistic AP VP Percent: 2.2 %
Brady Statistic AP VS Percent: 97 %
Brady Statistic AS VP Percent: 1 %
Brady Statistic AS VS Percent: 1 %
Brady Statistic RA Percent Paced: 99 %
Brady Statistic RV Percent Paced: 2.2 %
Date Time Interrogation Session: 20250130040013
Implantable Lead Connection Status: 753985
Implantable Lead Connection Status: 753985
Implantable Lead Implant Date: 20110901
Implantable Lead Implant Date: 20110901
Implantable Lead Location: 753859
Implantable Lead Location: 753860
Implantable Pulse Generator Implant Date: 20210426
Lead Channel Impedance Value: 290 Ohm
Lead Channel Impedance Value: 380 Ohm
Lead Channel Pacing Threshold Amplitude: 0.625 V
Lead Channel Pacing Threshold Amplitude: 2.375 V
Lead Channel Pacing Threshold Pulse Width: 0.5 ms
Lead Channel Pacing Threshold Pulse Width: 0.8 ms
Lead Channel Sensing Intrinsic Amplitude: 2.4 mV
Lead Channel Sensing Intrinsic Amplitude: 3.9 mV
Lead Channel Setting Pacing Amplitude: 1.625
Lead Channel Setting Pacing Amplitude: 2.625
Lead Channel Setting Pacing Pulse Width: 0.8 ms
Lead Channel Setting Sensing Sensitivity: 2 mV
Pulse Gen Model: 2272
Pulse Gen Serial Number: 3825617

## 2023-07-19 NOTE — Progress Notes (Signed)
 Remote pacemaker transmission.

## 2023-07-21 ENCOUNTER — Other Ambulatory Visit: Payer: Self-pay

## 2023-07-21 ENCOUNTER — Encounter: Payer: Self-pay | Admitting: Emergency Medicine

## 2023-07-21 ENCOUNTER — Emergency Department

## 2023-07-21 ENCOUNTER — Emergency Department
Admission: EM | Admit: 2023-07-21 | Discharge: 2023-07-21 | Disposition: A | Discharge status: Home / Self Care | Attending: Emergency Medicine | Admitting: Emergency Medicine

## 2023-07-21 DIAGNOSIS — G8929 Other chronic pain: Secondary | ICD-10-CM | POA: Insufficient documentation

## 2023-07-21 DIAGNOSIS — I10 Essential (primary) hypertension: Secondary | ICD-10-CM | POA: Diagnosis not present

## 2023-07-21 DIAGNOSIS — M25511 Pain in right shoulder: Secondary | ICD-10-CM | POA: Insufficient documentation

## 2023-07-21 DIAGNOSIS — M25512 Pain in left shoulder: Secondary | ICD-10-CM | POA: Insufficient documentation

## 2023-07-21 MED ORDER — MELOXICAM 15 MG PO TABS: 15.0000 mg | ORAL_TABLET | Freq: Every day | ORAL | 1 refills | Status: DC

## 2023-07-21 NOTE — ED Notes (Signed)
 Patient declined discharge vital signs.

## 2023-07-21 NOTE — ED Provider Notes (Signed)
 [facility] Emergency Department Provider Note     Event Date/Time   First MD Initiated Contact with Patient 07/21/23 1359     (approximate)   History   Shoulder Pain   HPI  Roy Tucker is a 73 y.o. male with a history of Graves' disease, hypertension, gout, bradycardia, and GERD, presents to the ED endorsing bilateral shoulder pain.  He would endorse more than 2 weeks of symptoms without any preceding injury, trauma, or fall.  He does endorse a history of RA, and reports symptoms used to be better managed with the marrow infusions.  He denies any chest pain, shortness of breath, distal paresthesias, or edema.  Physical Exam   Triage Vital Signs: ED Triage Vitals  Encounter Vitals Group     BP 07/21/23 1340 (!) 124/91     Systolic BP Percentile --      Diastolic BP Percentile --      Pulse Rate 07/21/23 1340 64     Resp 07/21/23 1340 18     Temp 07/21/23 1340 97.8 F (36.6 C)     Temp Source 07/21/23 1340 Oral     SpO2 07/21/23 1340 98 %     Weight 07/21/23 1339 216 lb 0.8 oz (98 kg)     Height 07/21/23 1339 5\' 10"  (1.778 m)     Head Circumference --      Peak Flow --      Pain Score 07/21/23 1339 10     Pain Loc --      Pain Education --      Exclude from Growth Chart --     Most recent vital signs: Vitals:   07/21/23 1340  BP: (!) 124/91  Pulse: 64  Resp: 18  Temp: 97.8 F (36.6 C)  SpO2: 98%    General Awake, no distress. NAD HEENT NCAT. PERRL. EOMI. No rhinorrhea. Mucous membranes are moist.  CV:  Good peripheral perfusion. RRR RESP:  Normal effort. CTA ABD:  No distention.  MSK:  Right shoulder without obvious deformity or dislocation.  Patient with decreased active range of motion on the right shoulder.  Left shoulder with active range of motion and range of motion limited only by pain.  Patient with normal composite fist distally. NEURO: Cranial nerves II to XII grossly intact.   ED Results / Procedures /  Treatments   Labs (all labs ordered are listed, but only abnormal results are displayed) Labs Reviewed - No data to display   EKG   RADIOLOGY  I personally viewed and evaluated these images as part of my medical decision making, as well as reviewing the written report by the radiologist.  ED Provider Interpretation: No acute findings  DG Shoulder Right Result Date: 07/21/2023 CLINICAL DATA:  Pain.  No history of trauma EXAM: RIGHT SHOULDER - 3 VIEW COMPARISON:  None Available. FINDINGS: Osteopenia. No fracture or dislocation. Preserved glenohumeral joint. Mild joint space loss of the AC joint. IMPRESSION: Osteopenia.  Degenerative changes of the [facility] joint. Electronically Signed   By: Karen Kays M.D.   On: 07/21/2023 14:10   DG Shoulder Left Result Date: 07/21/2023 CLINICAL DATA:  Pain.  No history of trauma reported EXAM: LEFT SHOULDER - 3 VIEW COMPARISON:  X-ray 11/10/2014 FINDINGS: Osteopenia. No fracture or dislocation. Preserved glenohumeral joint. Mild joint space loss of the AC joint. Left upper chest battery pack for pacemaker seen obscuring underlying bony structures. IMPRESSION: Osteopenia.  Degenerative changes.  Pacemaker Electronically Signed  By: Karen Kays M.D.   On: 07/21/2023 14:10    PROCEDURES:  Critical Care performed: No  Procedures   MEDICATIONS ORDERED IN ED: Medications - No data to display   IMPRESSION / MDM / ASSESSMENT AND PLAN / ED COURSE  I reviewed the triage vital signs and the nursing notes.                              Differential diagnosis includes, but is not limited to, arthritis, cervical radiculopathy, DJD, OA, myalgias, tendinitis, rotator cuff tendinitis  Patient's presentation is most consistent with acute complicated illness / injury requiring diagnostic workup.  Patient's diagnosis is consistent with chronic pain both shoulders likely due to underlying arthritis.  Patient otherwise reassuring exam and workup at this time.  No  reports of any acute injury, fall, trauma.  No acute neuro muscle deficits are appreciated.  Chart review reveals patient previously been treated with anti-inflammatories for his DJD/OA.  Patient will be discharged home with prescriptions for meloxicam. Patient is to follow up with his primary provider as discussed, as needed or otherwise directed. Patient is given ED precautions to return to the ED for any worsening or new symptoms.  FINAL CLINICAL IMPRESSION(S) / ED DIAGNOSES   Final diagnoses:  Chronic pain of both shoulders     Rx / DC Orders   ED Discharge Orders          Ordered    meloxicam (MOBIC) 15 MG tablet  Daily        07/21/23 1515             Note:  This document was prepared using Dragon voice recognition software and may include unintentional dictation errors.    Lissa Hoard, PA-C 07/22/23 1925    Trinna Post, MD 07/22/23 [phone]

## 2023-07-21 NOTE — Discharge Instructions (Addendum)
 Your exam and x-rays are normal and reassuring.  No signs of any serious degenerative arthritis on x-ray.  Take the meloxicam daily as directed.  See your primary provider next month as scheduled.  Return to the ED if needed.

## 2023-07-21 NOTE — ED Triage Notes (Signed)
 Patient to ED via POV for bilateral shoulder pain. States on going x2 weeks. Denies injury. States unable to lift arms due to pain. Hx arthritis.

## 2023-08-02 ENCOUNTER — Encounter (HOSPITAL_COMMUNITY): Payer: Self-pay

## 2023-08-02 ENCOUNTER — Other Ambulatory Visit: Payer: Self-pay

## 2023-08-02 ENCOUNTER — Emergency Department (HOSPITAL_COMMUNITY)
Admission: EM | Admit: 2023-08-02 | Discharge: 2023-08-02 | Disposition: A | Discharge status: Home / Self Care | Attending: Emergency Medicine | Admitting: Emergency Medicine

## 2023-08-02 DIAGNOSIS — E039 Hypothyroidism, unspecified: Secondary | ICD-10-CM | POA: Insufficient documentation

## 2023-08-02 DIAGNOSIS — M2550 Pain in unspecified joint: Secondary | ICD-10-CM | POA: Insufficient documentation

## 2023-08-02 DIAGNOSIS — Z950 Presence of cardiac pacemaker: Secondary | ICD-10-CM | POA: Insufficient documentation

## 2023-08-02 DIAGNOSIS — M542 Cervicalgia: Secondary | ICD-10-CM | POA: Diagnosis present

## 2023-08-02 DIAGNOSIS — I10 Essential (primary) hypertension: Secondary | ICD-10-CM | POA: Insufficient documentation

## 2023-08-02 LAB — CBC WITH DIFFERENTIAL/PLATELET
Abs Immature Granulocytes: 0.01 10*3/uL (ref 0.00–0.07)
Basophils Absolute: 0 10*3/uL (ref 0.0–0.1)
Basophils Relative: 0 %
Eosinophils Absolute: 0.1 10*3/uL (ref 0.0–0.5)
Eosinophils Relative: 1 %
HCT: 41.4 % (ref 39.0–52.0)
Hemoglobin: 13.6 g/dL (ref 13.0–17.0)
Immature Granulocytes: 0 %
Lymphocytes Relative: 27 %
Lymphs Abs: 1.3 10*3/uL (ref 0.7–4.0)
MCH: 28.9 pg (ref 26.0–34.0)
MCHC: 32.9 g/dL (ref 30.0–36.0)
MCV: 87.9 fL (ref 80.0–100.0)
Monocytes Absolute: 0.3 10*3/uL (ref 0.1–1.0)
Monocytes Relative: 7 %
Neutro Abs: 3.1 10*3/uL (ref 1.7–7.7)
Neutrophils Relative %: 65 %
Platelets: 171 10*3/uL (ref 150–400)
RBC: 4.71 MIL/uL (ref 4.22–5.81)
RDW: 13.2 % (ref 11.5–15.5)
WBC: 4.9 10*3/uL (ref 4.0–10.5)
nRBC: 0 % (ref 0.0–0.2)

## 2023-08-02 LAB — BASIC METABOLIC PANEL
Anion gap: 8 (ref 5–15)
BUN: 15 mg/dL (ref 8–23)
CO2: 25 mmol/L (ref 22–32)
Calcium: 9.6 mg/dL (ref 8.9–10.3)
Chloride: 102 mmol/L (ref 98–111)
Creatinine, Ser: 0.79 mg/dL (ref 0.61–1.24)
GFR, Estimated: >60 mL/min (ref >60)
Glucose, Bld: 106 mg/dL — ABNORMAL HIGH (ref 70–99)
Potassium: 4.2 mmol/L (ref 3.5–5.1)
Sodium: 135 mmol/L (ref 135–145)

## 2023-08-02 LAB — CK: Total CK: 36 U/L — ABNORMAL LOW (ref 49–397)

## 2023-08-02 MED ORDER — PREDNISONE 20 MG PO TABS: 40.0000 mg | ORAL_TABLET | Freq: Every day | ORAL | 0 refills | Status: DC

## 2023-08-02 MED ORDER — OXYCODONE HCL 5 MG PO TABS
10.0000 mg | ORAL_TABLET | Freq: Once | ORAL | Status: AC
  Administered 2023-08-02: 10 mg via ORAL
  Filled 2023-08-02: qty 2

## 2023-08-02 NOTE — ED Provider Notes (Signed)
 [place] EMERGENCY DEPARTMENT AT [facility] Provider Note   CSN: [id number] Arrival date & time: 08/02/23  1208     History  Chief Complaint  Patient presents with   Generalized Body Aches    CASS VANDERMEULEN is a 73 y.o. male.  Patient is a 73 year old male with a history of atrial tachycardia, sinus node dysfunction status post [facility] Jude pacemaker, hypothyroidism, hypertension, RA who used to be on Humira who is presenting today with diffuse pain.  He reports pain in bilateral shoulders, neck, hands.  He gets morning stiffness but notices his hands will swell.  Also reports it is painful even to comb his hair and his scalp will even hurt at times.  He reports symptoms been worsening over the last 2 months.  He did follow-up with orthopedist Dr. Merlyn Lot who did x-rays and diagnosed him with osteoarthritis but also was concern for RA and recommended he follow-up with a rheumatologist.  He is seeing a pain specialist and currently getting oxycodone 10 mg which she reports he is taking but it only will help his pain for a few hours and he wants to get to the bottom of it.  He was on Humira but his wife reported they gave him a double dose of the medication 2 years ago and he had a reaction and has not had it since.  He is not currently following with a rheumatologist.  He is supposed to be seeing a new doctor next month and they are going to inquire about a rheumatologist at that time the patient reports the pain is just more than he can bear that his wife is even having to dress him because it is too painful for him to put his clothes on.  He denies any fever, cough, shortness of breath.  No abdominal pain or vomiting.  He does not think he has weakness but it is more related to pain why he is having difficulty moving around and doing his ADLs.  The history is provided by the patient.       Home Medications Prior to Admission medications   Medication Sig Start Date End Date  Taking? Authorizing Provider  predniSONE (DELTASONE) 20 MG tablet Take 2 tablets (40 mg total) by mouth daily. Take 40mg  for 7 days, then take 1 tab (20mg ) po for 4 days and then 0.5 tab (10mg ) po for 2 days 08/02/23  Yes Gwyneth Sprout, MD  ALPRAZolam Prudy Feeler) 1 MG tablet Take 0.5 mg by mouth 2 (two) times daily.    [provider]  atenolol (TENORMIN) 25 MG tablet TAKE 1/2 TABLET BY MOUTH DAILY 07/09/22   Croitoru, Mihai, MD  atorvastatin (LIPITOR) 20 MG tablet Take 20 mg by mouth daily.     [provider]  esomeprazole (NEXIUM) 40 MG capsule Take 1 capsule (40 mg total) by mouth 2 (two) times daily before a meal. Patient taking differently: Take 40 mg by mouth 2 (two) times daily as needed (acid reflux/indigestion.). 04/25/16   Barrett, Joline Salt, PA-C  levothyroxine (SYNTHROID, LEVOTHROID) 175 MCG tablet Take 175 mcg by mouth daily before breakfast.    [provider]  loratadine (CLARITIN) 10 MG tablet Take 10 mg by mouth daily. 09/11/17   [provider]  meloxicam (MOBIC) 15 MG tablet Take 1 tablet (15 mg total) by mouth daily. 07/21/23 09/19/23  Menshew, Charlesetta Ivory, PA-C  oxyCODONE-acetaminophen (PERCOCET) 10-325 MG tablet Take 1 tablet by mouth every 6 (six) hours as needed  for pain. 08/07/19   [provider]      Allergies    Infliximab, Methimazole, Neurontin [gabapentin], Aspirin, and Tylenol [acetaminophen]    Review of Systems   Review of Systems  Physical Exam Updated Vital Signs BP (!) 143/78 (BP Location: Left Arm)   Pulse 60   Temp 97.9 F (36.6 C) (Oral)   Resp 17   Ht 5\' 10"  (1.778 m)   Wt 98 kg   SpO2 98%   BMI 31.00 kg/m  Physical Exam Vitals and nursing note reviewed.  Constitutional:      General: He is not in acute distress.    Appearance: He is well-developed.  HENT:     Head: Normocephalic and atraumatic.  Eyes:     Conjunctiva/sclera: Conjunctivae normal.     Pupils: Pupils are equal, round, and reactive to  light.  Cardiovascular:     Rate and Rhythm: Normal rate and regular rhythm.     Heart sounds: No murmur heard. Pulmonary:     Effort: Pulmonary effort is normal. No respiratory distress.     Breath sounds: Normal breath sounds. No wheezing or rales.  Abdominal:     General: There is no distension.     Palpations: Abdomen is soft.     Tenderness: There is no abdominal tenderness. There is no guarding or rebound.  Musculoskeletal:        General: Tenderness present. Normal range of motion.     Cervical back: Normal range of motion and neck supple.     Comments: Tenderness with palpation of bilateral shoulders and hands.  Mild swelling noted to the bilateral hands at the MCP joints.  Patient has difficulty making fists.  Also difficulty abducting bilateral shoulders more than 90 degrees.  Mild tenderness with palpation of cervical spine.  Skin:    General: Skin is warm and dry.     Findings: No erythema or rash.  Neurological:     Mental Status: He is alert and oriented to person, place, and time. Mental status is at baseline.  Psychiatric:        Mood and Affect: Mood normal.        Behavior: Behavior normal.     ED Results / Procedures / Treatments   Labs (all labs ordered are listed, but only abnormal results are displayed) Labs Reviewed  BASIC METABOLIC PANEL - Abnormal; Notable for the following components:      Result Value   Glucose, Bld 106 (*)    All other components within normal limits  CK - Abnormal; Notable for the following components:   Total CK 36 (*)    All other components within normal limits  CBC WITH DIFFERENTIAL/PLATELET    EKG None  Radiology No results found.  Procedures Procedures    Medications Ordered in ED Medications  oxyCODONE (Oxy IR/ROXICODONE) immediate release tablet 10 mg (10 mg Oral Given 08/02/23 1405)    ED Course/ Medical Decision Making/ A&P                                 Medical Decision Making Amount and/or Complexity  of Data Reviewed Labs: ordered. Decision-making details documented in ED Course.  Risk Prescription drug management.   Pt with multiple medical problems and comorbidities and presenting today with a complaint that caries a high risk for morbidity and mortality.  Here today with complaint of worsening pain and concerned that this  may be exacerbation of his RA that is now been untreated for several years.  However will also check a CK to ensure we do not need to worry about PMR.  Patient is not having any fevers or concern for infectious etiology at this time.  He used to be on Humira but is not receiving any infusions at this time.  Did discuss with the patient and his wife trying a short course of prednisone, continuing his current pain medication but ultimately will need to see a rheumatologist.  3:05 PM I independently interpreted patient's labs CBC, BMP and CK without acute findings.  At this time we will give a short course of prednisone but encourage patient to follow-up with rheumatology.  He and his wife are comfortable with this plan.         Final Clinical Impression(s) / ED Diagnoses Final diagnoses:  Arthralgia, unspecified joint    Rx / DC Orders ED Discharge Orders          Ordered    predniSONE (DELTASONE) 20 MG tablet  Daily        08/02/23 1503              Gwyneth Sprout, MD 08/02/23 1505

## 2023-08-02 NOTE — ED Triage Notes (Signed)
 Patient presented to ER for pain in bilateral shoulders, unable to lift arms due to pain. Patient states also feels like his feet has started to swell over the past month.

## 2023-08-02 NOTE — Discharge Instructions (Signed)
 You can try calling either of the dermatology numbers above to see if they can get an appointment.  Your labs look normal today but it seems that you are having a flare of your rheumatoid arthritis.  We will try a short course of steroids to see if that helps with your pain

## 2023-08-06 LAB — LAB REPORT - SCANNED: Creatinine, POC: 196.1 mg/dL

## 2023-08-27 ENCOUNTER — Encounter: Payer: Self-pay | Admitting: *Deleted

## 2023-08-28 ENCOUNTER — Telehealth: Payer: Self-pay | Admitting: General Practice

## 2023-08-28 ENCOUNTER — Ambulatory Visit (INDEPENDENT_AMBULATORY_CARE_PROVIDER_SITE_OTHER): Admitting: General Practice

## 2023-08-28 ENCOUNTER — Encounter: Payer: Self-pay | Admitting: General Practice

## 2023-08-28 VITALS — BP 122/84 | HR 73 | Temp 98.0°F | Ht 68.5 in | Wt 209.0 lb

## 2023-08-28 DIAGNOSIS — I495 Sick sinus syndrome: Secondary | ICD-10-CM

## 2023-08-28 DIAGNOSIS — E7800 Pure hypercholesterolemia, unspecified: Secondary | ICD-10-CM

## 2023-08-28 DIAGNOSIS — M109 Gout, unspecified: Secondary | ICD-10-CM

## 2023-08-28 DIAGNOSIS — Z7689 Persons encountering health services in other specified circumstances: Secondary | ICD-10-CM | POA: Insufficient documentation

## 2023-08-28 DIAGNOSIS — Z950 Presence of cardiac pacemaker: Secondary | ICD-10-CM

## 2023-08-28 DIAGNOSIS — E0590 Thyrotoxicosis, unspecified without thyrotoxic crisis or storm: Secondary | ICD-10-CM

## 2023-08-28 DIAGNOSIS — E6609 Other obesity due to excess calories: Secondary | ICD-10-CM

## 2023-08-28 DIAGNOSIS — E0500 Thyrotoxicosis with diffuse goiter without thyrotoxic crisis or storm: Secondary | ICD-10-CM | POA: Diagnosis not present

## 2023-08-28 DIAGNOSIS — B9681 Helicobacter pylori [H. pylori] as the cause of diseases classified elsewhere: Secondary | ICD-10-CM | POA: Insufficient documentation

## 2023-08-28 DIAGNOSIS — E66811 Obesity, class 1: Secondary | ICD-10-CM

## 2023-08-28 DIAGNOSIS — M0579 Rheumatoid arthritis with rheumatoid factor of multiple sites without organ or systems involvement: Secondary | ICD-10-CM

## 2023-08-28 DIAGNOSIS — J302 Other seasonal allergic rhinitis: Secondary | ICD-10-CM | POA: Insufficient documentation

## 2023-08-28 DIAGNOSIS — K219 Gastro-esophageal reflux disease without esophagitis: Secondary | ICD-10-CM | POA: Insufficient documentation

## 2023-08-28 DIAGNOSIS — E890 Postprocedural hypothyroidism: Secondary | ICD-10-CM | POA: Insufficient documentation

## 2023-08-28 DIAGNOSIS — Z6831 Body mass index (BMI) 31.0-31.9, adult: Secondary | ICD-10-CM

## 2023-08-28 DIAGNOSIS — E242 Drug-induced Cushing's syndrome: Secondary | ICD-10-CM

## 2023-08-28 HISTORY — DX: Drug-induced Cushing's syndrome: E24.2

## 2023-08-28 HISTORY — DX: Helicobacter pylori (H. pylori) as the cause of diseases classified elsewhere: B96.81

## 2023-08-28 HISTORY — DX: Gout, unspecified: M10.9

## 2023-08-28 LAB — COMPREHENSIVE METABOLIC PANEL WITH GFR
ALT: 10 U/L (ref 0–53)
AST: 13 U/L (ref 0–37)
Albumin: 4.4 g/dL (ref 3.5–5.2)
Alkaline Phosphatase: 127 U/L — ABNORMAL HIGH (ref 39–117)
BUN: 11 mg/dL (ref 6–23)
CO2: 29 meq/L (ref 19–32)
Calcium: 9.3 mg/dL (ref 8.4–10.5)
Chloride: 101 meq/L (ref 96–112)
Creatinine, Ser: 0.79 mg/dL (ref 0.40–1.50)
GFR: 90.1 mL/min (ref >60.00)
Glucose, Bld: 123 mg/dL — ABNORMAL HIGH (ref 70–99)
Potassium: 4.1 meq/L (ref 3.5–5.1)
Sodium: 139 meq/L (ref 135–145)
Total Bilirubin: 1.5 mg/dL — ABNORMAL HIGH (ref 0.2–1.2)
Total Protein: 6.9 g/dL (ref 6.0–8.3)

## 2023-08-28 LAB — LIPID PANEL
Cholesterol: 160 mg/dL (ref 0–200)
HDL: 44.5 mg/dL (ref >39.00)
LDL Cholesterol: 84 mg/dL (ref 0–99)
NonHDL: 115.98
Total CHOL/HDL Ratio: 4
Triglycerides: 160 mg/dL — ABNORMAL HIGH (ref 0.0–149.0)
VLDL: 32 mg/dL (ref 0.0–40.0)

## 2023-08-28 LAB — CBC
HCT: 39.1 % (ref 39.0–52.0)
Hemoglobin: 13.2 g/dL (ref 13.0–17.0)
MCHC: 33.7 g/dL (ref 30.0–36.0)
MCV: 86.6 fl (ref 78.0–100.0)
Platelets: 163 10*3/uL (ref 150.0–400.0)
RBC: 4.52 Mil/uL (ref 4.22–5.81)
RDW: 14.6 % (ref 11.5–15.5)
WBC: 4.5 10*3/uL (ref 4.0–10.5)

## 2023-08-28 LAB — HEMOGLOBIN A1C: Hgb A1c MFr Bld: 6.2 % (ref 4.6–6.5)

## 2023-08-28 LAB — TSH: TSH: 0.28 u[IU]/mL — ABNORMAL LOW (ref 0.35–5.50)

## 2023-08-28 MED ORDER — ATORVASTATIN CALCIUM 20 MG PO TABS: 20.0000 mg | ORAL_TABLET | Freq: Every day | ORAL | 2 refills | Status: AC

## 2023-08-28 MED ORDER — LORATADINE 10 MG PO TABS: 10.0000 mg | ORAL_TABLET | Freq: Every day | ORAL | 2 refills | Status: DC

## 2023-08-28 NOTE — Assessment & Plan Note (Signed)
 Followed by endocrinology. Repeat TSH level pending. Continue levothyroxine 175 mcg.

## 2023-08-28 NOTE — Assessment & Plan Note (Signed)
 Patient is unable to provide clear history on his RA diagnosis.  He was recently seen in the hospital was given phone numbers for to rheumatologist however he never called to make an appointment.  Unsure if he has done Humira in the past or any other medications in the past.  He relates that his arthritis is managed with his Percocet.  Long discussion with patient and provided patient with rheumatology phone numbers to call and make an appointment  He will update them later today to let me know if he needs a referral as a referral was already placed a few weeks ago at the hospital.  Will continue to monitor.

## 2023-08-28 NOTE — Assessment & Plan Note (Signed)
 Followed by cardiology once a year.  Controlled, no concerns today. Vies patient to make follow-up.

## 2023-08-28 NOTE — Assessment & Plan Note (Signed)
 Unable to find last lipid panel checked.  Repeat lipid panel pending. Continue atorvastatin 20 mg once daily.  Patient advised we will need to change dosage of cholesterol levels are not within range.

## 2023-08-28 NOTE — Assessment & Plan Note (Signed)
 EMR reviewed briefly.

## 2023-08-28 NOTE — Telephone Encounter (Signed)
 Records request has been filled out and signed by patient; request has been faxed to [facility].

## 2023-08-28 NOTE — Assessment & Plan Note (Signed)
 Discussed the importance of healthy diet and exercise to affect sustainable weight loss.

## 2023-08-28 NOTE — Assessment & Plan Note (Signed)
 Controlled.  Continue Nexium 40 mg as needed.  Declines prescription.

## 2023-08-28 NOTE — Patient Instructions (Addendum)
 Alanson Alliance, MD.  Specialty: Rheumatology Contact information: [address] [place] [address] [phone]  Nicholas Bari, MD.  Specialty: Rheumatology Contact information: [address] [place] [address] [phone]  Call and schedule appt with Rheumatology. Let them know you were referred from the hospital, if you need a referral let me know.   Bring medication bottles to your next visit.   Schedule follow up with Dr. Kathyanne Parkers.   Stop by the lab prior to leaving today. I will notify you of your results once received.   It was a pleasure to meet you today! Please don't hesitate to contact me with any questions. Welcome to [facility]!

## 2023-08-28 NOTE — Assessment & Plan Note (Signed)
 Followed by endocrinology.  Currently managed on levothyroxine 175 mcg. Repeat TSH level pending.

## 2023-08-28 NOTE — Telephone Encounter (Signed)
 Copied from CRM [id number]. Topic: Medical Record Request - Other >> Aug 28, 2023 12:45 PM Dyann Glaser G wrote: Reason for CRM: THE SPOUSE OF PT STATED HIS RECORDS ARE AT THE URGENT [facility]ARE ON [address].

## 2023-08-28 NOTE — Assessment & Plan Note (Signed)
 Followed by cardiology.  Currently has a pacemaker.  He plans to schedule a follow-up with cardiology soon.

## 2023-08-28 NOTE — Progress Notes (Signed)
 New Patient Office Visit  Subjective    Patient ID: Roy Tucker, male    DOB: 1950-12-07  Age: 73 y.o. MRN: [id number]  CC:  Chief Complaint  Patient presents with   New Patient (Initial Visit)    HPI Roy Tucker is a 73 y.o. male presents to establish care.   Last PCP/physical/labs: Dr. Wille Celeste - many years ago.   Chronic pain: currently managed by Dr. Jenne Pane with pain management. Currently managed on Percocet 10-325 mg q6 PRN and xanax 1 mg for insomnia.   Graves disease/hyperthyroidism: followed by Dr. Sharl Ma.   SVC syndrome/SSS/ paroxysmal atrial tachycardia/pacemaker: followed by cardiology. Currently managed on atorvastatin 20 mg once daily. He sees cardiology once a year. Denies any chest pain, shortness of breath or difficulty breathing.     Outpatient Encounter Medications as of 08/28/2023  Medication Sig   ALPRAZolam (XANAX) 1 MG tablet Take 0.5 mg by mouth 2 (two) times daily.   levothyroxine (SYNTHROID, LEVOTHROID) 175 MCG tablet Take 175 mcg by mouth daily before breakfast.   oxyCODONE-acetaminophen (PERCOCET) 10-325 MG tablet Take 1 tablet by mouth every 6 (six) hours as needed for pain.   [DISCONTINUED] atorvastatin (LIPITOR) 20 MG tablet Take 20 mg by mouth daily.    [DISCONTINUED] loratadine (CLARITIN) 10 MG tablet Take 10 mg by mouth daily.   atorvastatin (LIPITOR) 20 MG tablet Take 1 tablet (20 mg total) by mouth daily.   loratadine (CLARITIN) 10 MG tablet Take 1 tablet (10 mg total) by mouth daily.   [DISCONTINUED] atenolol (TENORMIN) 25 MG tablet TAKE 1/2 TABLET BY MOUTH DAILY   [DISCONTINUED] esomeprazole (NEXIUM) 40 MG capsule Take 1 capsule (40 mg total) by mouth 2 (two) times daily before a meal. (Patient taking differently: Take 40 mg by mouth 2 (two) times daily as needed (acid reflux/indigestion.).)   [DISCONTINUED] meloxicam (MOBIC) 15 MG tablet Take 1 tablet (15 mg total) by mouth daily.   [DISCONTINUED] predniSONE (DELTASONE) 20 MG tablet  Take 2 tablets (40 mg total) by mouth daily. Take 40mg  for 7 days, then take 1 tab (20mg ) po for 4 days and then 0.5 tab (10mg ) po for 2 days   No facility-administered encounter medications on file as of 08/28/2023.    Past Medical History:  Diagnosis Date   Arthritis    Atrial tachycardia (HCC)    Bradycardia    Chronic pain    Dysrhythmia    bradycardia   GERD (gastroesophageal reflux disease)    Gout    Gout 08/28/2023   Helicobacter pylori (H. pylori) as the cause of diseases classified elsewhere 08/28/2023   Hypertension    Hyperthyroidism    graves on prednisone   Iatrogenic Cushing's syndrome (HCC) 08/28/2023   Pacemaker    Pneumonia    few months ago   Sinus node dysfunction (HCC) 01/12/2010   [employer] pacemaker    Past Surgical History:  Procedure Laterality Date   COLONOSCOPY W/ POLYPECTOMY     NM MYOCAR PERF WALL MOTION  07/05/2010   no significant ischemia   PACEMAKER INSERTION  01/12/2010   [employer]ude Accent DR RF   PPM GENERATOR CHANGEOUT N/A 09/07/2019   Procedure: PPM GENERATOR CHANGEOUT;  Surgeon: Thurmon Fair, MD;  Location: [facility];  Service: Cardiovascular;  Laterality: N/A;   TOTAL KNEE ARTHROPLASTY  12/10/2011   Procedure: TOTAL KNEE ARTHROPLASTY;  Surgeon: Raymon Mutton, MD;  Location: [facility];  Service: Orthopedics;  Laterality: Right;   TOTAL KNEE ARTHROPLASTY Left  03/30/2019   Procedure: TOTAL KNEE ARTHROPLASTY;  Surgeon: Dannielle Huh, MD;  Location: [facility];  Service: Orthopedics;  Laterality: Left;  75 mins needed for length of case    Family History  Problem Relation Age of Onset   Cancer Father        unknown type of cancer   Heart disease Brother        h/o pacemaker   Heart failure Other     Social History   Socioeconomic History   Marital status: Married    Spouse name: Not on file   Number of children: Not on file   Years of education: Not on file   Highest education level: Not on file  Occupational History   Not on  file  Tobacco Use   Smoking status: Former    Current packs/day: 0.00    Average packs/day: 1 pack/day for 15.0 years (15.0 ttl pk-yrs)    Types: Cigarettes    Start date: 12/02/1988    Quit date: 12/03/2003    Years since quitting: 19.7   Smokeless tobacco: Never  Substance and Sexual Activity   Alcohol use: No   Drug use: No   Sexual activity: Not Currently  Other Topics Concern   Not on file  Social History Narrative   Not on file   Social Drivers of Health   Financial Resource Strain: Not on file  Food Insecurity: Low Risk  (06/25/2023)   Received from [facility]   Hunger Vital Sign    Worried About Running Out of Food in the Last Year: Never true    Ran Out of Food in the Last Year: Never true  Transportation Needs: No Transportation Needs (06/25/2023)   Received from [facility]    In the past 12 months, has lack of reliable transportation kept you from medical appointments, meetings, work or from getting things needed for daily living? : No  Physical Activity: Not on file  Stress: Not on file  Social Connections: Not on file  Intimate Partner Violence: Not on file    Review of Systems  Constitutional:  Negative for chills and fever.  Respiratory:  Negative for shortness of breath.   Cardiovascular:  Negative for chest pain.  Gastrointestinal:  Negative for abdominal pain, constipation, diarrhea, heartburn, nausea and vomiting.  Genitourinary:  Negative for dysuria, frequency and urgency.  Neurological:  Negative for dizziness and headaches.  Endo/Heme/Allergies:  Negative for polydipsia.  Psychiatric/Behavioral:  Negative for depression and suicidal ideas. The patient is not nervous/anxious.         Objective    BP 122/84 (BP Location: Left Arm, Patient Position: Sitting, Cuff Size: Normal)   Pulse 73   Temp 98 F (36.7 C) (Oral)   Ht 5' 8.5" (1.74 m)   Wt 209 lb (94.8 kg)   SpO2 98%   BMI 31.32 kg/m   Physical Exam Vitals and  nursing note reviewed.  Constitutional:      Appearance: Normal appearance.  Cardiovascular:     Rate and Rhythm: Normal rate and regular rhythm.     Pulses: Normal pulses.     Heart sounds: Normal heart sounds.  Pulmonary:     Effort: Pulmonary effort is normal.     Breath sounds: Normal breath sounds.  Neurological:     Mental Status: He is alert and oriented to person, place, and time.  Psychiatric:        Mood and Affect: Mood normal.  Behavior: Behavior normal.        Thought Content: Thought content normal.        Judgment: Judgment normal.         Assessment & Plan:  Hypercholesterolemia Assessment & Plan: Unable to find last lipid panel checked.  Repeat lipid panel pending. Continue atorvastatin 20 mg once daily.  Patient advised we will need to change dosage of cholesterol levels are not within range.  Orders: -     Lipid panel -     Hemoglobin A1c -     Comprehensive metabolic panel with GFR -     CBC -     Atorvastatin Calcium; Take 1 tablet (20 mg total) by mouth daily.  Dispense: 30 tablet; Refill: 2  Establishing care with new doctor, encounter for Assessment & Plan: EMR reviewed briefly.     Hyperthyroidism Assessment & Plan: Followed by endocrinology. Repeat TSH level pending. Continue levothyroxine 175 mcg.  Orders: -     TSH  Graves' disease Assessment & Plan: Followed by endocrinology.  Currently managed on levothyroxine 175 mcg. Repeat TSH level pending.  Orders: -     TSH  Rheumatoid arthritis involving multiple sites with positive rheumatoid factor (HCC) Assessment & Plan: Patient is unable to provide clear history on his RA diagnosis.  He was recently seen in the hospital was given phone numbers for to rheumatologist however he never called to make an appointment.  Unsure if he has done Humira in the past or any other medications in the past.  He relates that his arthritis is managed with his Percocet.  Long discussion with  patient and provided patient with rheumatology phone numbers to call and make an appointment  He will update them later today to let me know if he needs a referral as a referral was already placed a few weeks ago at the hospital.  Will continue to monitor.   Pacemaker Assessment & Plan: Followed by cardiology once a year.  Controlled, no concerns today. Vies patient to make follow-up.   Seasonal allergies Assessment & Plan: Controlled with Claritin 10 mg once daily.  Refill provided today.  Orders: -     Loratadine; Take 1 tablet (10 mg total) by mouth daily.  Dispense: 30 tablet; Refill: 2  Class 1 obesity due to excess calories with serious comorbidity and body mass index (BMI) of 31.0 to 31.9 in adult Assessment & Plan: Discussed the importance of healthy diet and exercise to affect sustainable weight loss.     SSS (sick sinus syndrome) (HCC) Assessment & Plan: Followed by cardiology.  Currently has a pacemaker.  He plans to schedule a follow-up with cardiology soon.   Gastroesophageal reflux disease, unspecified whether esophagitis present Assessment & Plan: Controlled.  Continue Nexium 40 mg as needed.  Declines prescription.      Return in about 4 weeks (around 09/25/2023) for health maintainence.   Jolanda Nation, NP

## 2023-08-28 NOTE — Assessment & Plan Note (Signed)
 Controlled with Claritin 10 mg once daily.  Refill provided today.

## 2023-09-09 ENCOUNTER — Telehealth: Payer: Self-pay | Admitting: General Practice

## 2023-09-09 NOTE — Telephone Encounter (Signed)
 The hospital put I rheumatology referral for patient and looks like it was approved by Dr. Rodell Citrin to take patient. Called patients wife back and advised her where the referral is and to contact that office to schedule the appt.

## 2023-09-09 NOTE — Telephone Encounter (Signed)
 Patients wife walked in asking about referral that was sent out. She states office has not received the referral as of yet and patient is in pain. Please refax referral.

## 2023-09-12 ENCOUNTER — Ambulatory Visit: Payer: Medicare HMO | Admitting: Cardiovascular Disease

## 2023-09-12 DIAGNOSIS — Z950 Presence of cardiac pacemaker: Secondary | ICD-10-CM

## 2023-09-12 LAB — CUP PACEART REMOTE DEVICE CHECK
Battery Remaining Longevity: 65 mo
Battery Remaining Percentage: 63 %
Battery Voltage: 3.01 V
Brady Statistic AP VP Percent: 2.2 %
Brady Statistic AP VS Percent: 97 %
Brady Statistic AS VP Percent: 1 %
Brady Statistic AS VS Percent: 1 %
Brady Statistic RA Percent Paced: 99 %
Brady Statistic RV Percent Paced: 2.2 %
Date Time Interrogation Session: 20250501040013
Implantable Lead Connection Status: 753985
Implantable Lead Connection Status: 753985
Implantable Lead Implant Date: 20110901
Implantable Lead Implant Date: 20110901
Implantable Lead Location: 753859
Implantable Lead Location: 753860
Implantable Pulse Generator Implant Date: 20210426
Lead Channel Impedance Value: 300 Ohm
Lead Channel Impedance Value: 380 Ohm
Lead Channel Pacing Threshold Amplitude: 0.625 V
Lead Channel Pacing Threshold Amplitude: 1.75 V
Lead Channel Pacing Threshold Pulse Width: 0.5 ms
Lead Channel Pacing Threshold Pulse Width: 0.8 ms
Lead Channel Sensing Intrinsic Amplitude: 2.9 mV
Lead Channel Sensing Intrinsic Amplitude: 4.7 mV
Lead Channel Setting Pacing Amplitude: 1.625
Lead Channel Setting Pacing Amplitude: 2 V
Lead Channel Setting Pacing Pulse Width: 0.8 ms
Lead Channel Setting Sensing Sensitivity: 2 mV
Pulse Gen Model: 2272
Pulse Gen Serial Number: 3825617

## 2023-09-16 NOTE — Progress Notes (Signed)
 Noted! Thank you

## 2023-09-30 ENCOUNTER — Ambulatory Visit: Admitting: General Practice

## 2023-10-09 DIAGNOSIS — G894 Chronic pain syndrome: Secondary | ICD-10-CM | POA: Insufficient documentation

## 2023-10-28 ENCOUNTER — Ambulatory Visit: Admitting: General Practice

## 2023-10-29 ENCOUNTER — Telehealth: Payer: Self-pay | Admitting: Cardiovascular Disease

## 2023-10-29 NOTE — Telephone Encounter (Signed)
 I spoke with the pt to let her know we did receive the message he reconnected with his monitor.

## 2023-10-29 NOTE — Telephone Encounter (Signed)
  1. Has your device fired? N/A  2. Is you device beeping? N/A  3. Are you experiencing draining or swelling at device site? N/A  4. Are you calling to see if we received your device transmission?   No  5. Have you passed out? N/A   Wife Haskell Linker) called to report patient has reconnected his device as they have moved to a new location.    Please route to Device Clinic Pool

## 2023-11-07 ENCOUNTER — Ambulatory Visit

## 2023-12-12 ENCOUNTER — Ambulatory Visit

## 2023-12-12 DIAGNOSIS — I495 Sick sinus syndrome: Secondary | ICD-10-CM

## 2023-12-12 LAB — CUP PACEART REMOTE DEVICE CHECK
Battery Remaining Longevity: 61 mo
Battery Remaining Percentage: 61 %
Battery Voltage: 2.99 V
Brady Statistic AP VP Percent: 2.2 %
Brady Statistic AP VS Percent: 97 %
Brady Statistic AS VP Percent: 1 %
Brady Statistic AS VS Percent: 1 %
Brady Statistic RA Percent Paced: 99 %
Brady Statistic RV Percent Paced: 2.2 %
Date Time Interrogation Session: 20250731040014
Implantable Lead Connection Status: 753985
Implantable Lead Connection Status: 753985
Implantable Lead Implant Date: 20110901
Implantable Lead Implant Date: 20110901
Implantable Lead Location: 753859
Implantable Lead Location: 753860
Implantable Pulse Generator Implant Date: 20210426
Lead Channel Impedance Value: 300 Ohm
Lead Channel Impedance Value: 360 Ohm
Lead Channel Pacing Threshold Amplitude: 0.75 V
Lead Channel Pacing Threshold Amplitude: 1.875 V
Lead Channel Pacing Threshold Pulse Width: 0.5 ms
Lead Channel Pacing Threshold Pulse Width: 0.8 ms
Lead Channel Sensing Intrinsic Amplitude: 3.6 mV
Lead Channel Sensing Intrinsic Amplitude: 4 mV
Lead Channel Setting Pacing Amplitude: 1.75 V
Lead Channel Setting Pacing Amplitude: 2.125
Lead Channel Setting Pacing Pulse Width: 0.8 ms
Lead Channel Setting Sensing Sensitivity: 2 mV
Pulse Gen Model: 2272
Pulse Gen Serial Number: 3825617

## 2023-12-17 ENCOUNTER — Ambulatory Visit: Payer: Self-pay | Admitting: Cardiovascular Disease

## 2023-12-17 ENCOUNTER — Ambulatory Visit

## 2023-12-23 ENCOUNTER — Ambulatory Visit (HOSPITAL_BASED_OUTPATIENT_CLINIC_OR_DEPARTMENT_OTHER): Admitting: Family Medicine

## 2023-12-23 ENCOUNTER — Encounter (HOSPITAL_BASED_OUTPATIENT_CLINIC_OR_DEPARTMENT_OTHER): Payer: Self-pay | Admitting: Family Medicine

## 2023-12-23 VITALS — BP 119/79 | HR 65 | Temp 97.6°F | Resp 16 | Ht 68.5 in | Wt 184.5 lb

## 2023-12-23 DIAGNOSIS — G894 Chronic pain syndrome: Secondary | ICD-10-CM | POA: Diagnosis not present

## 2023-12-23 DIAGNOSIS — I10 Essential (primary) hypertension: Secondary | ICD-10-CM

## 2023-12-23 DIAGNOSIS — E0590 Thyrotoxicosis, unspecified without thyrotoxic crisis or storm: Secondary | ICD-10-CM | POA: Diagnosis not present

## 2023-12-23 DIAGNOSIS — M1990 Unspecified osteoarthritis, unspecified site: Secondary | ICD-10-CM | POA: Insufficient documentation

## 2023-12-23 DIAGNOSIS — M1991 Primary osteoarthritis, unspecified site: Secondary | ICD-10-CM | POA: Diagnosis not present

## 2023-12-23 DIAGNOSIS — M069 Rheumatoid arthritis, unspecified: Secondary | ICD-10-CM
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
## 2023-12-23 NOTE — Assessment & Plan Note (Signed)
 Clearly has at least moderate OA in his right shoulder among other locations.  I'll have Ortho evaluate him locally to consider injections, etc.

## 2023-12-23 NOTE — Assessment & Plan Note (Signed)
 Obtain and review records from Endocrinology.

## 2023-12-23 NOTE — Assessment & Plan Note (Signed)
 Uncontrolled.  He isn't satisfied with [facility] and I'll have him evaluated by [facility].

## 2023-12-23 NOTE — Assessment & Plan Note (Signed)
 Controlled.  Extended review of his extensive PMH today.

## 2023-12-23 NOTE — Assessment & Plan Note (Signed)
 Prefers a different Rheumatologist and I'll assist with this referral.

## 2024-01-20 ENCOUNTER — Encounter: Admitting: Internal Medicine

## 2024-01-21 ENCOUNTER — Encounter (HOSPITAL_BASED_OUTPATIENT_CLINIC_OR_DEPARTMENT_OTHER): Payer: Self-pay

## 2024-01-21 ENCOUNTER — Ambulatory Visit (HOSPITAL_BASED_OUTPATIENT_CLINIC_OR_DEPARTMENT_OTHER): Admitting: Family Medicine

## 2024-01-21 ENCOUNTER — Encounter (HOSPITAL_BASED_OUTPATIENT_CLINIC_OR_DEPARTMENT_OTHER): Payer: Self-pay | Admitting: Family Medicine

## 2024-01-21 ENCOUNTER — Telehealth (HOSPITAL_BASED_OUTPATIENT_CLINIC_OR_DEPARTMENT_OTHER): Payer: Self-pay | Admitting: *Deleted

## 2024-01-21 VITALS — BP 139/83 | HR 65 | Temp 97.7°F | Resp 16 | Wt 182.7 lb

## 2024-01-21 DIAGNOSIS — B9681 Helicobacter pylori [H. pylori] as the cause of diseases classified elsewhere: Secondary | ICD-10-CM | POA: Insufficient documentation

## 2024-01-21 DIAGNOSIS — R748 Abnormal levels of other serum enzymes: Secondary | ICD-10-CM | POA: Diagnosis not present

## 2024-01-21 DIAGNOSIS — G894 Chronic pain syndrome: Secondary | ICD-10-CM

## 2024-01-21 DIAGNOSIS — M069 Rheumatoid arthritis, unspecified: Secondary | ICD-10-CM

## 2024-01-21 DIAGNOSIS — M8580 Other specified disorders of bone density and structure, unspecified site: Secondary | ICD-10-CM | POA: Insufficient documentation

## 2024-01-21 NOTE — Assessment & Plan Note (Signed)
 Advise a Dexascan at his next visit.

## 2024-01-21 NOTE — Progress Notes (Signed)
 Established Patient Office Visit  Subjective   Patient ID: Roy Tucker, male    DOB: 06/02/1950  Age: 73 y.o. MRN: [id number]  Chief Complaint  Patient presents with   Follow-up    Follow-up visit    F/u as above.  Please see last note for details.  He does have plans to see Rheumatology, but has had to reschedule.  He states he never heard from the referral regarding the pain specialist in [place], and I'll ask our staff about the status of this.  I also encouraged him to cut back on his Xanax , as I'm not enthusiastic about his taking this at all.  Was started some time ago by a previous provider.    Past Medical History:  Diagnosis Date   Anxiety    Chronic pain    known to [facility]   Dyslipidemia    Former smoker    20 pack year history before quitting 30 years ago   GERD (gastroesophageal reflux disease)    Gout 08/28/2023   History of Helicobacter pylori infection    Hypertension    Hyperthyroidism    f/by Endocrine   Insomnia    Osteoarthritis    Pacemaker    Rheumatoid arthritis (HCC)    f/by Rheumatology   Sinus node dysfunction (HCC) 01/12/2010   [employer]ude pacemaker, f/by [facility]    Outpatient Encounter Medications as of 01/21/2024  Medication Sig   ALPRAZolam  (XANAX ) 1 MG tablet Take 0.5 mg by mouth 2 (two) times daily.   atorvastatin  (LIPITOR) 20 MG tablet Take 1 tablet (20 mg total) by mouth daily.   esomeprazole  (NEXIUM ) 40 MG capsule as directed Orally Once a day   levothyroxine  (SYNTHROID , LEVOTHROID) 175 MCG tablet Take 175 mcg by mouth daily before breakfast.   loratadine  (CLARITIN ) 10 MG tablet Take 1 tablet (10 mg total) by mouth daily.   oxyCODONE -acetaminophen  (PERCOCET) 10-325 MG tablet Take 1 tablet by mouth every 6 (six) hours as needed for pain.   No facility-administered encounter medications on file as of 01/21/2024.    Social History   Tobacco Use   Smoking status: Former    Current packs/day: 0.00    Average  packs/day: 1 pack/day for 15.0 years (15.0 ttl pk-yrs)    Types: Cigarettes    Start date: 12/02/1988    Quit date: 12/03/2003    Years since quitting: 20.1   Smokeless tobacco: Never  Vaping Use   Vaping status: Never Used  Substance Use Topics   Alcohol use: No   Drug use: No      Review of Systems  Constitutional:  Negative for diaphoresis, fever, malaise/fatigue and weight loss.  Respiratory:  Negative for cough, shortness of breath and wheezing.   Cardiovascular:  Negative for chest pain, palpitations, orthopnea, claudication, leg swelling and PND.      Objective:     BP 139/83 (BP Location: Right Arm, Cuff Size: Normal)   Pulse 65   Temp 97.7 F (36.5 C) (Oral)   Resp 16   Wt 182 lb 11.2 oz (82.9 kg)   SpO2 97%   BMI 27.37 kg/m    Physical Exam Constitutional:      General: He is not in acute distress.    Appearance: Normal appearance.     Comments: Stiff gait with cane used.  HENT:     Head: Normocephalic.  Neck:     Vascular: No carotid bruit.  Cardiovascular:     Rate and Rhythm:  Normal rate and regular rhythm.     Pulses: Normal pulses.     Heart sounds: Normal heart sounds.  Pulmonary:     Effort: Pulmonary effort is normal.     Breath sounds: Normal breath sounds.  Abdominal:     General: Bowel sounds are normal.     Palpations: Abdomen is soft.  Musculoskeletal:     Cervical back: Neck supple. No tenderness.     Right lower leg: No edema.     Left lower leg: No edema.  Neurological:     Mental Status: He is alert.      No results found for any visits on 01/21/24.    The 10-year ASCVD risk score (Arnett DK, et al., 2019) is: 19.4%    Assessment & Plan:  Rheumatoid arthritis, involving unspecified site, unspecified whether rheumatoid factor present [facility]) Assessment & Plan: Stable.  F/u with Rheumatology as directed.   Chronic pain disorder Assessment & Plan: Await referral to new specialist as discussed above.   Elevated  liver enzymes Assessment & Plan: Patient just had labs done yesterday by a nurse.  He declines repeat labs today, but understands he eventually needs f/u of this.   Osteopenia, unspecified location Assessment & Plan: Advise a Dexascan at his next visit.   I personally spent a total of 25 minutes in the care of the patient today including performing a medically appropriate exam/evaluation, counseling and educating, documenting clinical information in the EHR, and communicating results.   Return in about 3 months (around 04/21/2024) for chronic follow-up.    REDDING PONCE NORLEEN FALCON., MD

## 2024-01-21 NOTE — Assessment & Plan Note (Signed)
 Stable.  F/u with Rheumatology as directed.

## 2024-01-21 NOTE — Assessment & Plan Note (Signed)
 Await referral to new specialist as discussed above.

## 2024-01-21 NOTE — Assessment & Plan Note (Signed)
 Patient just had labs done yesterday by a nurse.  He declines repeat labs today, but understands he eventually needs f/u of this.

## 2024-01-29 NOTE — Telephone Encounter (Signed)
Pt. Made aware of message.

## 2024-02-12 NOTE — Progress Notes (Signed)
 Remote PPM Transmission

## 2024-02-17 ENCOUNTER — Ambulatory Visit: Admitting: Internal Medicine

## 2024-03-12 ENCOUNTER — Ambulatory Visit

## 2024-03-12 DIAGNOSIS — I495 Sick sinus syndrome: Secondary | ICD-10-CM

## 2024-03-12 LAB — CUP PACEART REMOTE DEVICE CHECK
Battery Remaining Longevity: 46 mo
Battery Remaining Percentage: 58 %
Battery Voltage: 2.96 V
Brady Statistic AP VP Percent: 2.7 %
Brady Statistic AP VS Percent: 97 %
Brady Statistic AS VP Percent: 1 %
Brady Statistic AS VS Percent: 1 %
Brady Statistic RA Percent Paced: 99 %
Brady Statistic RV Percent Paced: 2.7 %
Date Time Interrogation Session: 20251030040017
Implantable Lead Connection Status: 753985
Implantable Lead Connection Status: 753985
Implantable Lead Implant Date: 20110901
Implantable Lead Implant Date: 20110901
Implantable Lead Location: 753859
Implantable Lead Location: 753860
Implantable Pulse Generator Implant Date: 20210426
Lead Channel Impedance Value: 260 Ohm
Lead Channel Impedance Value: 380 Ohm
Lead Channel Pacing Threshold Amplitude: 0.75 V
Lead Channel Pacing Threshold Amplitude: 1.75 V
Lead Channel Pacing Threshold Pulse Width: 0.5 ms
Lead Channel Pacing Threshold Pulse Width: 0.8 ms
Lead Channel Sensing Intrinsic Amplitude: 2.1 mV
Lead Channel Sensing Intrinsic Amplitude: 2.6 mV
Lead Channel Setting Pacing Amplitude: 1.75 V
Lead Channel Setting Pacing Amplitude: 5 V
Lead Channel Setting Pacing Pulse Width: 0.8 ms
Lead Channel Setting Sensing Sensitivity: 2 mV
Pulse Gen Model: 2272
Pulse Gen Serial Number: 3825617

## 2024-03-14 NOTE — Progress Notes (Unsigned)
 Office Visit Note  Patient: Roy Tucker             Date of Birth: 24-Oct-1950           MRN: [id number]             PCP: Dottie Norleen PHEBE PONCE, MD Referring: Carlie Rogelia DELENA DEVONNA Visit Date: 03/16/2024 Occupation: Data Unavailable  Subjective:  No chief complaint on file.   History of Present Illness: Roy Tucker is a 73 y.o. male ***     Activities of Daily Living:  Patient reports morning stiffness for *** {minute/hour:19697}.   Patient {ACTIONS;DENIES/REPORTS:21021675::Denies} nocturnal pain.  Difficulty dressing/grooming: {ACTIONS;DENIES/REPORTS:21021675::Denies} Difficulty climbing stairs: {ACTIONS;DENIES/REPORTS:21021675::Denies} Difficulty getting out of chair: {ACTIONS;DENIES/REPORTS:21021675::Denies} Difficulty using hands for taps, buttons, cutlery, and/or writing: {ACTIONS;DENIES/REPORTS:21021675::Denies}  No Rheumatology ROS completed.   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   Hypercholesterolemia 07/12/2016   Essential hypertension 12/27/2015   Class 1 obesity due to excess calories with body mass index (BMI) of 31.0 to 31.9 in adult 12/27/2015   Rheumatoid arthritis (HCC) 08/18/2015   SSS (sick sinus syndrome) (HCC) 06/21/2015   Paroxysmal atrial tachycardia (HCC) 06/21/2015   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   Pacemaker 11/28/2012   Hyperthyroidism 07/24/2011   Graves' disease 07/24/2011    Past Medical History:  Diagnosis Date   Anxiety    Chronic pain    known to [facility]   Dyslipidemia    Former smoker    20  pack year history before quitting 30 years ago   GERD (gastroesophageal reflux disease)    Gout 08/28/2023   History of Helicobacter pylori infection    Hypertension    Hyperthyroidism    f/by Endocrine   Insomnia    Osteoarthritis    Pacemaker    Rheumatoid arthritis (HCC)    f/by Rheumatology   Sinus node dysfunction (HCC) 01/12/2010   [employer] pacemaker, f/by [facility] [employer]    Family History  Problem Relation Age of Onset   Cancer Father        unknown type of cancer   Heart disease Brother        h/o pacemaker   Heart failure Other    Past Surgical History:  Procedure Laterality Date   COLONOSCOPY W/ POLYPECTOMY     NM MYOCAR PERF WALL MOTION  07/05/2010   no significant ischemia   PACEMAKER INSERTION  01/12/2010   [employer] Acc[employer] DR RF   PPM GENERATOR CHANGEOUT N/A 09/07/2019   Procedure: PPM GENERATOR CHANGEOUT;  Surgeon: Francyne Headland, MD;  [facility];  Service: Cardiovascular;  Laterality: N/A;   TOTAL KNEE ARTHROPLASTY  12/10/2011   Procedure: TOTAL KNEE ARTHROPLASTY;  Surgeon: Garnette JONETTA Raman, MD;  Location: [facility];  Service: Orthopedics;  Laterality: Right;   TOTAL KNEE ARTHROPLASTY Left 03/30/2019   Procedure: TOTAL KNEE ARTHROPLASTY;  Surgeon: Raman Kemps, MD;  Location: [facility];  Service: Orthopedics;  Laterality: Left;  75 mins needed for length of case   Social History   Tobacco Use   Smoking status: Former    Current packs/day: 0.00  Average packs/day: 1 pack/day for 15.0 years (15.0 ttl pk-yrs)    Types: Cigarettes    Start date: 12/02/1988    Quit date: 12/03/2003    Years since quitting: 20.2   Smokeless tobacco: Never  Vaping Use   Vaping status: Never Used  Substance Use Topics   Alcohol use: No   Drug use: No   Social History   Social History Narrative   Not on file     Immunization History  Administered Date(s) Administered   PFIZER(Purple Top)SARS-COV-2 Vaccination 08/06/2019, 08/31/2019, 04/17/2020   PNEUMOCOCCAL  CONJUGATE-20 04/15/2022     Objective: Vital Signs: There were no vitals taken for this visit.   Physical Exam   Musculoskeletal Exam: ***  CDAI Exam: CDAI Score: -- Patient Global: --; Provider Global: -- Swollen: --; Tender: -- Joint Exam 03/16/2024   No joint exam has been documented for this visit   There is currently no information documented on the homunculus. Go to the Rheumatology activity and complete the homunculus joint exam.  Investigation: No additional findings.  Imaging: CUP PACEART REMOTE DEVICE CHECK Result Date: 03/12/2024 PPM Scheduled remote reviewed. Normal device function.  Presenting rhythm: AP/VS RV threshold at high output, VP=2.7% - route to triage Next remote transmission per protocol. LA, CVRS   Recent Labs: Lab Results  Component Value Date   WBC 4.5 08/28/2023   HGB 13.2 08/28/2023   PLT 163.0 08/28/2023   NA 139 08/28/2023   K 4.1 08/28/2023   CL 101 08/28/2023   CO2 29 08/28/2023   GLUCOSE 123 (H) 08/28/2023   BUN 11 08/28/2023   CREATININE 0.79 08/28/2023   BILITOT 1.5 (H) 08/28/2023   ALKPHOS 127 (H) 08/28/2023   AST 13 08/28/2023   ALT 10 08/28/2023   PROT 6.9 08/28/2023   ALBUMIN 4.4 08/28/2023   CALCIUM  9.3 08/28/2023   GFRAA 92 09/03/2019    Speciality Comments: No specialty comments available.  Procedures:  No procedures performed Allergies: Infliximab, Methimazole, Neurontin [gabapentin], Tigecycline, Aspirin, and Tylenol  [acetaminophen ]   Assessment / Plan:     Visit Diagnoses: No diagnosis found.  Orders: No orders of the defined types were placed in this encounter.  No orders of the defined types were placed in this encounter.   Face-to-face time spent with patient was *** minutes. Greater than 50% of time was spent in counseling and coordination of care.  Follow-Up Instructions: No follow-ups on file.   Lonni LELON Ester, MD  Note - This record has been created using [employer].  Chart  creation errors have been sought, but may not always  have been located. Such creation errors do not reflect on  the standard of medical care.

## 2024-03-16 ENCOUNTER — Encounter: Payer: Self-pay | Admitting: Internal Medicine

## 2024-03-16 ENCOUNTER — Ambulatory Visit: Attending: Internal Medicine | Admitting: Internal Medicine

## 2024-03-16 DIAGNOSIS — M059 Rheumatoid arthritis with rheumatoid factor, unspecified: Secondary | ICD-10-CM

## 2024-03-17 NOTE — Progress Notes (Signed)
 Remote PPM Transmission

## 2024-03-18 ENCOUNTER — Other Ambulatory Visit (HOSPITAL_BASED_OUTPATIENT_CLINIC_OR_DEPARTMENT_OTHER): Payer: Self-pay | Admitting: Family Medicine

## 2024-03-18 DIAGNOSIS — J302 Other seasonal allergic rhinitis: Secondary | ICD-10-CM

## 2024-03-18 NOTE — Telephone Encounter (Unsigned)
 Copied from CRM #[id number]. Topic: Clinical - Medication Refill >> Mar 18, 2024  3:17 PM Deaijah H wrote: Medication: loratadine  (CLARITIN ) 10 MG tablet  Has the patient contacted their pharmacy? No (Agent: If no, request that the patient contact the pharmacy for the refill. If patient does not wish to contact the pharmacy document the reason why and proceed with request.) (Agent: If yes, when and what did the pharmacy advise?)  This is the patient's preferred pharmacy:  [id number] [place],  - [address] AT [facility] [address] [place] [address] Phone: [phone] Fax: [phone]  Is this the correct pharmacy for this prescription? Yes If no, delete pharmacy and type the correct one.   Has the prescription been filled recently? No  Is the patient out of the medication? Yes  Has the patient been seen for an appointment in the last year OR does the patient have an upcoming appointment? Yes  Can we respond through MyChart? Yes  Agent: Please be advised that Rx refills may take up to 3 business days. We ask that you follow-up with your pharmacy.

## 2024-03-19 ENCOUNTER — Other Ambulatory Visit (HOSPITAL_BASED_OUTPATIENT_CLINIC_OR_DEPARTMENT_OTHER): Payer: Self-pay | Admitting: Family Medicine

## 2024-03-19 DIAGNOSIS — J302 Other seasonal allergic rhinitis: Secondary | ICD-10-CM

## 2024-03-19 NOTE — Telephone Encounter (Unsigned)
 Copied from CRM [id number]. Topic: Clinical - Medication Refill >> Mar 19, 2024  3:57 PM Darshell M wrote: Medication:  loratadine  (CLARITIN ) 10 MG tablet   Has the patient contacted their pharmacy? No (Agent: If no, request that the patient contact the pharmacy for the refill. If patient does not wish to contact the pharmacy document the reason why and proceed with request.) (Agent: If yes, when and what did the pharmacy advise?)  This is the patient's preferred pharmacy:  [id number] [place], [place] - [address] AT [facility] OF [address] [place] [address] Phone: [phone] Fax: [phone]  Is this the correct pharmacy for this prescription? Yes If no, delete pharmacy and type the correct one.   Has the prescription been filled recently? No  Is the patient out of the medication? Yes  Has the patient been seen for an appointment in the last year OR does the patient have an upcoming appointment? Yes  Can we respond through MyChart? Yes  Agent: Please be advised that Rx refills may take up to 3 business days. We ask that you follow-up with your pharmacy.

## 2024-03-20 ENCOUNTER — Other Ambulatory Visit (HOSPITAL_BASED_OUTPATIENT_CLINIC_OR_DEPARTMENT_OTHER): Payer: Self-pay | Admitting: Family Medicine

## 2024-03-20 DIAGNOSIS — J302 Other seasonal allergic rhinitis: Secondary | ICD-10-CM

## 2024-03-20 MED ORDER — LORATADINE 10 MG PO TABS: 10.0000 mg | ORAL_TABLET | Freq: Every day | ORAL | 2 refills | Status: DC

## 2024-03-20 MED ORDER — LORATADINE 10 MG PO TABS: 10.0000 mg | ORAL_TABLET | Freq: Every day | ORAL | 2 refills | Status: AC

## 2024-03-21 ENCOUNTER — Ambulatory Visit: Payer: Self-pay | Admitting: Cardiovascular Disease

## 2024-03-23 ENCOUNTER — Telehealth: Payer: Self-pay | Admitting: Cardiovascular Disease

## 2024-03-23 NOTE — Telephone Encounter (Signed)
 Tucker, Jerel, MD to [facility]   03/21/24  5:19 PM Long overdue for pacemaker check in the office and we need to make an adjustment to the settings on one of the pacemaker leads.  Please schedule an office appointment  Last read by Nancyann JONELLE Pouch at 8:42AM on 03/23/2024. CUP PACEART REMOTE DEVICE CHECK  Called patient's wife, Roy Tucker [facility]), back about message sent through Mychart. She is seeing Dr. Francyne on 04/07/24 and was wondering if, the patient, her husband can be seen at the same time. There are no open spots at present time, but would see if Dr. Francyne is open to double booking at the time he sees wife.

## 2024-03-23 NOTE — Telephone Encounter (Signed)
 Left message of appointment and to call back if they have any questions.

## 2024-03-23 NOTE — Telephone Encounter (Signed)
 Can we please put him in the 2 PM slot that day?

## 2024-03-23 NOTE — Telephone Encounter (Signed)
 Wife calling in regarding the mychart message. Please advise

## 2024-03-25 ENCOUNTER — Encounter (HOSPITAL_BASED_OUTPATIENT_CLINIC_OR_DEPARTMENT_OTHER): Payer: Self-pay

## 2024-03-25 ENCOUNTER — Ambulatory Visit (INDEPENDENT_AMBULATORY_CARE_PROVIDER_SITE_OTHER)

## 2024-03-25 VITALS — BP 139/83 | Ht 68.5 in | Wt 180.0 lb

## 2024-03-25 DIAGNOSIS — Z0000 Encounter for general adult medical examination without abnormal findings: Secondary | ICD-10-CM | POA: Diagnosis not present

## 2024-03-25 NOTE — Patient Instructions (Signed)
 Roy Tucker,  Thank you for taking the time for your Medicare Wellness Visit. I appreciate your continued commitment to your health goals. Please review the care plan we discussed, and feel free to reach out if I can assist you further.  Please note that Annual Wellness Visits do not include a physical exam. Some assessments may be limited, especially if the visit was conducted virtually. If needed, we may recommend an in-person follow-up with your provider.  Ongoing Care Seeing your primary care provider every 3 to 6 months helps us  monitor your health and provide consistent, personalized care.   Referrals If a referral was made during today's visit and you haven't received any updates within two weeks, please contact the referred provider directly to check on the status.  Recommended Screenings:  Health Maintenance  Topic Date Due   Medicare Annual Wellness Visit  Never done   DTaP/Tdap/Td vaccine (1 - Tdap) Never done   Zoster (Shingles) Vaccine (1 of 2) Never done   Flu Shot  08/11/2024*   Colon Cancer Screening  05/11/2030   Pneumococcal Vaccine for age over 50  Completed   Hepatitis C Screening  Completed   Meningitis B Vaccine  Aged Out   COVID-19 Vaccine  Discontinued  *Topic was postponed. The date shown is not the original due date.       03/25/2024    8:27 AM  Advanced Directives  Does Patient Have a Medical Advance Directive? No  Would patient like information on creating a medical advance directive? No - Patient declined    Vision: Annual vision screenings are recommended for early detection of glaucoma, cataracts, and diabetic retinopathy. These exams can also reveal signs of chronic conditions such as diabetes and high blood pressure.  Dental: Annual dental screenings help detect early signs of oral cancer, gum disease, and other conditions linked to overall health, including heart disease and diabetes.  Please see the attached documents for additional  preventive care recommendations.

## 2024-03-25 NOTE — Progress Notes (Signed)
 I connected with  Nancyann JONELLE Pouch on 03/25/24 by a audio enabled telemedicine application and verified that I am speaking with the correct person using two identifiers.  Patient Location: Home  Provider Location: Home Office  Persons Participating in Visit: Patient.  I discussed the limitations of evaluation and management by telemedicine. The patient expressed understanding and agreed to proceed.  Vital Signs: Because this visit was a virtual/telehealth visit, some criteria may be missing or patient reported. Any vitals not documented were not able to be obtained and vitals that have been documented are patient reported.   Because this visit was a virtual/telehealth visit,  certain criteria was not obtained, such a blood pressure, CBG if applicable, and timed get up and go. Any medications not marked as taking were not mentioned during the medication reconciliation part of the visit. Any vitals not documented were not able to be obtained due to this being a telehealth visit or patient was unable to self-report a recent blood pressure reading due to a lack of equipment at home via telehealth. Vitals that have been documented are verbally provided by the patient.   This visit was performed by a medical professional under my direct supervision. I was immediately available for consultation/collaboration. I have reviewed and agree with the Annual Wellness Visit documentation.  Chief Complaint  Patient presents with   Medicare Wellness     Subjective:   WOFFORD STRATTON is a 73 y.o. male who presents for a Medicare Annual Wellness Visit.  Allergies (verified) Cymbalta [duloxetine hcl], Infliximab, Methimazole, Neurontin [gabapentin], Tigecycline, Aspirin, and Tylenol  [acetaminophen ]   History: Past Medical History:  Diagnosis Date   Anxiety    Chronic pain    known to [facility]   Dyslipidemia    Former smoker    20 pack year history before quitting 30 years ago   GERD  (gastroesophageal reflux disease)    Gout 08/28/2023   History of Helicobacter pylori infection    Hypertension    Hyperthyroidism    f/by Endocrine   Insomnia    Osteoarthritis    Pacemaker    Rheumatoid arthritis (HCC)    f/by Rheumatology   Sinus node dysfunction (HCC) 01/12/2010   [employer] pacemaker, f/by [facility]   Past Surgical History:  Procedure Laterality Date   COLONOSCOPY W/ POLYPECTOMY     NM MYOCAR PERF WALL MOTION  07/05/2010   no significant ischemia   PACEMAKER INSERTION  01/12/2010   [employer] Acc[employer] DR RF   PPM GENERATOR CHANGEOUT N/A 09/07/2019   Procedure: PPM GENERATOR CHANGEOUT;  Surgeon: Francyne Headland, MD;  [facility]: [facility];  Service: Cardiovascular;  Laterality: N/A;   TOTAL KNEE ARTHROPLASTY  12/10/2011   Procedure: TOTAL KNEE ARTHROPLASTY;  Surgeon: Garnette JONETTA Raman, MD;  Location: [facility] OR;  Service: Orthopedics;  Laterality: Right;   TOTAL KNEE ARTHROPLASTY Left 03/30/2019   Procedure: TOTAL KNEE ARTHROPLASTY;  Surgeon: Raman Kemps, MD;  Location: [facility];  Service: Orthopedics;  Laterality: Left;  75 mins needed for length of case   Family History  Problem Relation Age of Onset   Cancer Father        unknown type of cancer   Diabetes Brother    Heart disease Brother        h/o pacemaker   Heart failure Other    Social History   Occupational History   Occupation: Retired from [employer]  Tobacco Use   Smoking status: Former    Current packs/day:  0.00    Average packs/day: 1 pack/day for 15.0 years (15.0 ttl pk-yrs)    Types: Cigarettes    Start date: 12/02/1988    Quit date: 12/03/2003    Years since quitting: 20.3    Passive exposure: Never   Smokeless tobacco: Never  Vaping Use   Vaping status: Never Used  Substance and Sexual Activity   Alcohol use: No   Drug use: No   Sexual activity: Not Currently   Tobacco Counseling Counseling given: Not Answered  SDOH Screenings   Food Insecurity: No Food Insecurity (03/25/2024)  Housing:  Low Risk  (03/25/2024)  Transportation Needs: No Transportation Needs (03/25/2024)  Utilities: Not At Risk (03/25/2024)  Alcohol Screen: Low Risk  (12/23/2023)  Depression (PHQ2-9): Low Risk  (03/25/2024)  Financial Resource Strain: Low Risk  (12/23/2023)  Physical Activity: Insufficiently Active (03/25/2024)  Social Connections: Socially Integrated (03/25/2024)  Stress: No Stress Concern Present (03/25/2024)  Tobacco Use: Medium Risk (03/25/2024)  Health Literacy: Adequate Health Literacy (03/25/2024)   Depression Screen    03/25/2024    8:34 AM 12/23/2023    8:20 AM 08/28/2023   10:08 AM  PHQ 2/9 Scores  PHQ - 2 Score 2 0 0  PHQ- 9 Score 2 0  1      Data saved with a previous flowsheet row definition     Goals Addressed               This Visit's Progress     Patient Stated (pt-stated)        To watch football        Visit info / Clinical Intake: Medicare Wellness Visit Type:: Initial Annual Wellness Visit Persons participating in visit:: patient Medicare Wellness Visit Mode:: Telephone If telephone:: video declined Because this visit was a virtual/telehealth visit:: pt reported vitals If Telephone or Video please confirm:: I connected with the patient using audio enabled telemedicine application and verified that I am speaking with the correct person using two identifiers; I discussed the limitations of evaluation and management by telemedicine; The patient expressed understanding and agreed to proceed Patient Location:: home Provider Location:: home office Information given by:: patient Interpreter Needed?: No Pre-visit prep was completed: yes AWV questionnaire completed by patient prior to visit?: no Living arrangements:: lives with spouse/significant other Patient's Overall Health Status Rating: very good Typical amount of pain: some Does pain affect daily life?: no Are you currently prescribed opioids?: (!) yes  Dietary Habits and Nutritional Risks How  many meals a day?: 2 Eats fruit and vegetables daily?: yes Most meals are obtained by: preparing own meals; eating out; having others provide food In the last 2 weeks, have you had any of the following?: none Diabetic:: no  Functional Status Activities of Daily Living (to include ambulation/medication): Independent Ambulation: Independent with device- listed below Home Assistive Devices/Equipment: Cane Medication Administration: Independent Home Management: Independent Manage your own finances?: yes Primary transportation is: driving Concerns about vision?: no *vision screening is required for WTM* Concerns about hearing?: no  Fall Screening Falls in the past year?: 0 Number of falls in past year: 0 Was there an injury with Fall?: 0 Fall Risk Category Calculator: 0 Patient Fall Risk Level: Low Fall Risk  Fall Risk Patient at Risk for Falls Due to: Impaired mobility; Impaired balance/gait Fall risk Follow up: Falls evaluation completed; Falls prevention discussed  Home and Transportation Safety: All rugs have non-skid backing?: yes All stairs or steps have railings?: yes Grab bars in the bathtub or  shower?: yes Have non-skid surface in bathtub or shower?: yes Good home lighting?: yes Regular seat belt use?: yes Hospital stays in the last year:: no  Cognitive Assessment Difficulty concentrating, remembering, or making decisions? : yes Will 6CIT or Mini Cog be Completed: yes What year is it?: 0 points What month is it?: 0 points Give patient an address phrase to remember (5 components): remember words apple, table , penny About what time is it?: 0 points Count backwards from 20 to 1: 0 points Say the months of the year in reverse: 0 points Repeat the address phrase from earlier: 0 points 6 CIT Score: 0 points  Advance Directives (For Healthcare) Does Patient Have a Medical Advance Directive?: No Would patient like information on creating a medical advance directive?:  No - Patient declined  Reviewed/Updated  Reviewed/Updated: Reviewed All (Medical, Surgical, Family, Medications, Allergies, Care Teams, Patient Goals)        Objective:    Today's Vitals   03/25/24 0825  BP: 139/83  Weight: 180 lb (81.6 kg)  Height: 5' 8.5 (1.74 m)   Body mass index is 26.97 kg/m.  Current Medications (verified) Outpatient Encounter Medications as of 03/25/2024  Medication Sig   ALPRAZolam  (XANAX ) 1 MG tablet Take 0.5 mg by mouth 2 (two) times daily.   atorvastatin  (LIPITOR) 20 MG tablet Take 1 tablet (20 mg total) by mouth daily.   esomeprazole  (NEXIUM ) 40 MG capsule as directed Orally Once a day   levothyroxine  (SYNTHROID , LEVOTHROID) 175 MCG tablet Take 175 mcg by mouth daily before breakfast.   loratadine  (CLARITIN ) 10 MG tablet Take 1 tablet (10 mg total) by mouth daily.   loratadine  (CLARITIN ) 10 MG tablet Take 1 tablet (10 mg total) by mouth daily.   oxyCODONE -acetaminophen  (PERCOCET) 10-325 MG tablet Take 1 tablet by mouth every 6 (six) hours as needed for pain.   No facility-administered encounter medications on file as of 03/25/2024.   Hearing/Vision screen Hearing Screening - Comments:: Patient wears hearing aids  Vision Screening - Comments:: Wears glasses , patient sees [facility] in [place] Immunizations and Health Maintenance Health Maintenance  Topic Date Due   Medicare Annual Wellness (AWV)  Never done   DTaP/Tdap/Td (1 - Tdap) Never done   Zoster Vaccines- Shingrix (1 of 2) Never done   Influenza Vaccine  08/11/2024 (Originally 12/13/2023)   Colonoscopy  05/11/2030   Pneumococcal Vaccine: 50+ Years  Completed   Hepatitis C Screening  Completed   Meningococcal B Vaccine  Aged Out   COVID-19 Vaccine  Discontinued        Assessment/Plan:  This is a routine wellness examination for Alfonzia.  Patient Care Team: Dottie Norleen PHEBE PONCE, MD as PCP - General (Family Medicine) Croitoru, Jerel, MD as PCP - Cardiology  (Cardiology) Ziolkowska, Aldona, MD (Internal Medicine)  I have personally reviewed and noted the following in the patient's chart:   Medical and social history Use of alcohol, tobacco or illicit drugs  Current medications and supplements including opioid prescriptions. Functional ability and status Nutritional status Physical activity Advanced directives List of other physicians Hospitalizations, surgeries, and ER visits in previous 12 months Vitals Screenings to include cognitive, depression, and falls Referrals and appointments  No orders of the defined types were placed in this encounter.  In addition, I have reviewed and discussed with patient certain preventive protocols, quality metrics, and best practice recommendations. A written personalized care plan for preventive services as well as general preventive health recommendations were provided to  patient.   Lyle MARLA Right, [place]   03/25/2024   No follow-ups on file.  After Visit Summary: (MyChart) Due to this being a telephonic visit, the after visit summary with patients personalized plan was offered to patient via MyChart   Nurse Notes: nothing to report

## 2024-04-07 ENCOUNTER — Encounter: Payer: Self-pay | Admitting: Cardiovascular Disease

## 2024-04-07 ENCOUNTER — Ambulatory Visit: Attending: Cardiovascular Disease | Admitting: Cardiovascular Disease

## 2024-04-07 VITALS — BP 135/74 | HR 72 | Resp 17 | Ht 68.0 in | Wt 182.0 lb

## 2024-04-07 DIAGNOSIS — T82110A Breakdown (mechanical) of cardiac electrode, initial encounter: Secondary | ICD-10-CM

## 2024-04-07 DIAGNOSIS — I10 Essential (primary) hypertension: Secondary | ICD-10-CM

## 2024-04-07 DIAGNOSIS — E7800 Pure hypercholesterolemia, unspecified: Secondary | ICD-10-CM

## 2024-04-07 DIAGNOSIS — I495 Sick sinus syndrome: Secondary | ICD-10-CM | POA: Diagnosis not present

## 2024-04-07 NOTE — Patient Instructions (Signed)
 Medication Instructions:  No changes *If you need a refill on your cardiac medications before your next appointment, please call your pharmacy*  Lab Work: None ordered If you have labs (blood work) drawn today and your tests are completely normal, you will receive your results only by: MyChart Message (if you have MyChart) OR A paper copy in the mail If you have any lab test that is abnormal or we need to change your treatment, we will call you to review the results.  Testing/Procedures: None ordered  Follow-Up: At [facility], you and your health needs are our priority.  As part of our continuing mission to provide you with exceptional heart care, our providers are all part of one team.  This team includes your primary Cardiologist (physician) and Advanced Practice Providers or APPs (Physician Assistants and Nurse Practitioners) who all work together to provide you with the care you need, when you need it.  Your next appointment:   1 year(s)- Pacer Check [STJ]  Provider:   Jerel Balding, MD    We recommend signing up for the patient portal called MyChart.  Sign up information is provided on this After Visit Summary.  MyChart is used to connect with patients for Virtual Visits (Telemedicine).  Patients are able to view lab/test results, encounter notes, upcoming appointments, etc.  Non-urgent messages can be sent to your provider as well.   To learn more about what you can do with MyChart, go to [email or web address].

## 2024-04-07 NOTE — Progress Notes (Unsigned)
 Cardiology Office Note    Date:  04/09/2024   ID:  Roy Tucker, DOB 08-20-1950, MRN [id number]  PCP:  Roy Norleen PHEBE PONCE, MD  Cardiologist:   Jerel Balding, MD   Chief Complaint  Patient presents with   SSS (sick sinus syndrome) [facility])   Pacemaker   Follow-up    History of Present Illness:  [name] is a 73 y.o. male with symptomatic sinus node dysfunction returning for follow-up on his dual-chamber permanent pacemaker (St. Jude Accent implanted 2011, both leads [place] 2088, generator change out in April 2021, St Jude [employer]urity). Additional medical problems include systemic hypertension, hyperlipidemia and treated hypothyroidism, rheumatoid arthritis.  Roy Tucker has been compliant with remote pacemaker downloads, but this is his first in office evaluation since December 2021.  Over the last 2 or 3 months, he has noticed shortness of breath and weakness.  He does not have orthopnea, PND or lower extremity edema and has not experienced chest pain dizziness or syncope.  Interrogation of his pacemaker showed that fairly abruptly, 2 or 3 months ago there was a marked worsening of sensing and capture threshold on the ventricular lead.  The R waves decreased from 6 mV to only 2.1 mV, leading to under sensing and a sudden increase in the occurrence of ventricular pacing from less than 5% to virtually 100%.  He had ventricular auto capture on and it automatically reverted to high but testing of the pacing threshold in the office today shows that the capture is at 4.0 V at 1.0 ms so even at the high output settings he may have experienced some loss of capture.  He is not pacemaker dependent.  Underlying rhythm is sinus bradycardia at about 48-45 bpm with 1: 1 AV conduction.  The current estimated generator longevity is 1.0-4.7 years, with the broad splaying estimation related to the high output on the ventricular lead.  In the past his device has recorded some episodes of atrial  noise reversion or artifactual atrial arrhythmia due to atrial lead oversensing which we had were able to reproduce with isometric exercises, but not with pocket stimulation.  That problem is no longer apparent.    Past Medical History:  Diagnosis Date   Anxiety    Chronic pain    known to [facility]   Dyslipidemia    Former smoker    20 pack year history before quitting 30 years ago   GERD (gastroesophageal reflux disease)    Gout 08/28/2023   History of Helicobacter pylori infection    Hypertension    Hyperthyroidism    f/by Endocrine   Insomnia    Osteoarthritis    Pacemaker    Rheumatoid arthritis (HCC)    f/by Rheumatology   Sinus node dysfunction (HCC) 01/12/2010   St.Jude pacemaker, f/by [facility]    Past Surgical History:  Procedure Laterality Date   COLONOSCOPY W/ POLYPECTOMY     NM MYOCAR PERF WALL MOTION  07/05/2010   no significant ischemia   PACEMAKER INSERTION  01/12/2010   St.Jude Accent DR RF   PPM GENERATOR CHANGEOUT N/A 09/07/2019   Procedure: PPM GENERATOR CHANGEOUT;  Surgeon: Balding Jerel, MD;  Location: [facility] INVASIVE CV [facility];  Service: Cardiovascular;  Laterality: N/A;   TOTAL KNEE ARTHROPLASTY  12/10/2011   Procedure: TOTAL KNEE ARTHROPLASTY;  Surgeon: Garnette JONETTA Raman, MD;  Location: [facility] OR;  Service: Orthopedics;  Laterality: Right;   TOTAL KNEE ARTHROPLASTY Left 03/30/2019   Procedure: TOTAL KNEE ARTHROPLASTY;  Surgeon: Rubie Kemps, MD;  Location: [facility] ORS;  Service: Orthopedics;  Laterality: Left;  75 mins needed for length of case    Current Medications: Outpatient Medications Prior to Visit  Medication Sig Dispense Refill   atorvastatin  (LIPITOR) 20 MG tablet Take 1 tablet (20 mg total) by mouth daily. 30 tablet 2   esomeprazole  (NEXIUM ) 40 MG capsule as directed Orally Once a day     levothyroxine  (SYNTHROID , LEVOTHROID) 175 MCG tablet Take 175 mcg by mouth daily before breakfast.     loratadine  (CLARITIN ) 10 MG tablet Take 1 tablet (10 mg  total) by mouth daily. 30 tablet 2   loratadine  (CLARITIN ) 10 MG tablet Take 1 tablet (10 mg total) by mouth daily. 30 tablet 2   oxyCODONE -acetaminophen  (PERCOCET) 10-325 MG tablet Take 1 tablet by mouth every 6 (six) hours as needed for pain.     ALPRAZolam  (XANAX ) 1 MG tablet Take 0.5 mg by mouth 2 (two) times daily. (Patient not taking: Reported on 04/07/2024)     No facility-administered medications prior to visit.     Allergies:   Cymbalta [duloxetine hcl], Infliximab, Methimazole, Neurontin [gabapentin], Tigecycline, Aspirin, and Tylenol  [acetaminophen ]   Social History   Socioeconomic History   Marital status: Married    Spouse name: Not on file   Number of children: 2   Years of education: Not on file   Highest education level: Not on file  Occupational History   Occupation: Retired from [employer]  Tobacco Use   Smoking status: Former    Current packs/day: 0.00    Average packs/day: 1 pack/day for 15.0 years (15.0 ttl pk-yrs)    Types: Cigarettes    Start date: 12/02/1988    Quit date: 12/03/2003    Years since quitting: 20.3    Passive exposure: Never   Smokeless tobacco: Never  Vaping Use   Vaping status: Never Used  Substance and Sexual Activity   Alcohol use: No   Drug use: No   Sexual activity: Not Currently  Other Topics Concern   Not on file  Social History Narrative   Not on file   Social Drivers of Health   Financial Resource Strain: Low Risk  (12/23/2023)   Overall Financial Resource Strain (CARDIA)    Difficulty of Paying Living Expenses: Not very hard  Food Insecurity: No Food Insecurity (03/25/2024)   Hunger Vital Sign    Worried About Running Out of Food in the Last Year: Never true    Ran Out of Food in the Last Year: Never true  Transportation Needs: No Transportation Needs (03/25/2024)   PRAPARE - Administrator, Civil Service (Medical): No    Lack of Transportation (Non-Medical): No  Physical Activity: Insufficiently Active  (03/25/2024)   Exercise Vital Sign    Days of Exercise per Week: 7 days    Minutes of Exercise per Session: 10 min  Stress: No Stress Concern Present (03/25/2024)   [employer] of Occupational Health - Occupational Stress Questionnaire    Feeling of Stress: Not at all  Social Connections: Socially Integrated (03/25/2024)   Social Connection and Isolation Panel    Frequency of Communication with Friends and Family: More than three times a week    Frequency of Social Gatherings with Friends and Family: More than three times a week    Attends Religious Services: More than 4 times per year    Active Member of [employer] or Organizations: Yes    Attends Banker Meetings: More  than 4 times per year    Marital Status: Married       ROS:   Please see the history of present illness.    ROS All other systems are reviewed and are negative.   PHYSICAL EXAM:   VS:  BP 135/74 (BP Location: Left Arm, Patient Position: Sitting, Cuff Size: Normal)   Pulse 72   Resp 17   Ht 5' 8 (1.727 m)   Wt 182 lb (82.6 kg)   SpO2 97%   BMI 27.67 kg/m      General: Alert, oriented x3, no distress, healthy left subclavian pacemaker site Head: no evidence of trauma, PERRL, EOMI, no exophtalmos or lid lag, no myxedema, no xanthelasma; normal ears, nose and oropharynx Neck: normal jugular venous pulsations and no hepatojugular reflux; brisk carotid pulses without delay and no carotid bruits Chest: clear to auscultation, no signs of consolidation by percussion or palpation, normal fremitus, symmetrical and full respiratory excursions Cardiovascular: normal position and quality of the apical impulse, regular rhythm, normal first and second heart sounds, no murmurs, rubs or gallops Abdomen: no tenderness or distention, no masses by palpation, no abnormal pulsatility or arterial bruits, normal bowel sounds, no hepatosplenomegaly Extremities: no clubbing, cyanosis or edema; 2+ radial, ulnar and  brachial pulses bilaterally; 2+ right femoral, posterior tibial and dorsalis pedis pulses; 2+ left femoral, posterior tibial and dorsalis pedis pulses; no subclavian or femoral bruits Neurological: grossly nonfocal Psych: Normal mood and affect  Wt Readings from Last 3 Encounters:  04/07/24 182 lb (82.6 kg)  03/25/24 180 lb (81.6 kg)  01/21/24 182 lb 11.2 oz (82.9 kg)      Studies/Labs Reviewed:   EKG:   EKG Interpretation Date/Time:  Tuesday April 07 2024 13:41:33 EST Ventricular Rate:  61 PR Interval:  200 QRS Duration:  92 QT Interval:  386 QTC Calculation: 388 R Axis:   -84  Text Interpretation: Atrial paced rhythm, Ventricular undersensing with ventricular pacing and pseudofusion Left anterior fascicular block When compared with ECG of 26-May-2021 16:14, there is now ventricular pacing due to undersensing of native-conducted QRS complexes Axis shifted left Confirmed by Amaranta Mehl ([id number]) on 04/07/2024 5:43:51 PM         Recent Labs: 08/28/2023: ALT 10; BUN 11; Creatinine, Ser 0.79; Hemoglobin 13.2; Platelets 163.0; Potassium 4.1; Sodium 139; TSH 0.28   Lipid Panel    Component Value Date/Time   CHOL 160 08/28/2023 1100   TRIG 160.0 (H) 08/28/2023 1100   HDL 44.50 08/28/2023 1100   CHOLHDL 4 08/28/2023 1100   VLDL 32.0 08/28/2023 1100   LDLCALC 84 08/28/2023 1100   He reports that he had a repeat lipid profile with Luke Ivory couple of months ago, but this is not currently available for review.  ASSESSMENT:    1. SSS (sick sinus syndrome) (HCC)   2. Pacemaker lead failure, initial encounter   3. Essential hypertension   4. Hypercholesterolemia      PLAN:  In order of problems listed above:  SSS: Over the last several years he has had virtually 100% atrial paced, ventricular sensed rhythm, until the last couple of months. Due to ventricular under sensing over the last couple of months he has had a marked increase in the incidence of ventricular  pacing, probably at many times with pseudofusion, but possibly at other times with ventricular activation via the pacemaker.  The timing of the change in lead parameters seems to coincide with his complaints of shortness of breath and weakness.  Hopefully with the device setting adjustments that we made today the symptoms will abate.  If not, we will evaluate for other causes of his symptoms.  PPM: Previous problems with noise on the atrial lead (2088) are no longer an issue, but his ventricular lead has major abnormalities with poor sensing and very high capture threshold.  The sensing issue seems to be resolved by reprogramming sensing to 1.0 mV.  This should hopefully prevent unnecessary ventricular pacing, which she has not really needed in the past.  If there is no ventricular pacing the high capture threshold on the ventricular lead will be less of an issue.  When it comes time for his generator change out we will decide whether or not the old pacemaker leads should be extracted and replaced (this would be preferable since he would then continue to have an MRI conditional system) or whether it is safer to just place a new ventricular lead.  PAT: All of these previous events were probably artifactual related to atrial noise oversensing.  None has been seen on multiple recent downloads. HTN: Very well controlled.  In fact his blood pressure is quite low today.  We will stop the atenolol  if he develops symptomatic hypotension. HLP/preDM: He does not have known CAD or PAD.  Most recent LDL cholesterol of 84, HDL 45, triglycerides are acceptable values.  Continue statin.  Hemoglobin A1c earlier this year was in prediabetes range at 6.2%.  He remains mildly overweight.  Discussed avoiding sweets, sugary drinks, starches with high glycemic index and trying to exercise regularly.    Medication Adjustments/Labs and Tests Ordered: Current medicines are reviewed at length with the patient today.  Concerns  regarding medicines are outlined above.  Medication changes, Labs and Tests ordered today are listed in the Patient Instructions below. Patient Instructions  Medication Instructions:  No changes *If you need a refill on your cardiac medications before your next appointment, please call your pharmacy*  Lab Work: None ordered If you have labs (blood work) drawn today and your tests are completely normal, you will receive your results only by: MyChart Message (if you have MyChart) OR A paper copy in the mail If you have any lab test that is abnormal or we need to change your treatment, we will call you to review the results.  Testing/Procedures: None ordered  Follow-Up: At [facility], you and your health needs are our priority.  As part of our continuing mission to provide you with exceptional heart care, our providers are all part of one team.  This team includes your primary Cardiologist (physician) and Advanced Practice Providers or APPs (Physician Assistants and Nurse Practitioners) who all work together to provide you with the care you need, when you need it.  Your next appointment:   1 year(s)- Pacer Check [STJ]  Provider:   Jerel Balding, MD    We recommend signing up for the patient portal called MyChart.  Sign up information is provided on this After Visit Summary.  MyChart is used to connect with patients for Virtual Visits (Telemedicine).  Patients are able to view lab/test results, encounter notes, upcoming appointments, etc.  Non-urgent messages can be sent to your provider as well.   To learn more about what you can do with MyChart, go to [email or web address].     Signed, Jerel Balding, MD  04/09/2024 3:36 PM    [facility] [address] [place], [place], [place]  [address] Phone: [phone]; Fax: [phone]

## 2024-04-21 ENCOUNTER — Ambulatory Visit (HOSPITAL_BASED_OUTPATIENT_CLINIC_OR_DEPARTMENT_OTHER): Admitting: Family Medicine

## 2024-04-27 ENCOUNTER — Encounter (HOSPITAL_BASED_OUTPATIENT_CLINIC_OR_DEPARTMENT_OTHER): Payer: Self-pay | Admitting: Family Medicine

## 2024-04-27 ENCOUNTER — Ambulatory Visit (INDEPENDENT_AMBULATORY_CARE_PROVIDER_SITE_OTHER): Admitting: Family Medicine

## 2024-04-27 VITALS — BP 128/83 | HR 60 | Temp 97.5°F | Resp 17 | Wt 186.3 lb

## 2024-04-27 DIAGNOSIS — G894 Chronic pain syndrome: Secondary | ICD-10-CM | POA: Diagnosis not present

## 2024-04-27 DIAGNOSIS — Z23 Encounter for immunization: Secondary | ICD-10-CM

## 2024-04-27 DIAGNOSIS — I2510 Atherosclerotic heart disease of native coronary artery without angina pectoris: Secondary | ICD-10-CM | POA: Diagnosis not present

## 2024-04-27 DIAGNOSIS — K117 Disturbances of salivary secretion: Secondary | ICD-10-CM | POA: Diagnosis not present

## 2024-04-27 DIAGNOSIS — Z79891 Long term (current) use of opiate analgesic: Secondary | ICD-10-CM | POA: Insufficient documentation

## 2024-04-27 DIAGNOSIS — F419 Anxiety disorder, unspecified: Secondary | ICD-10-CM | POA: Diagnosis not present

## 2024-04-27 DIAGNOSIS — E785 Hyperlipidemia, unspecified: Secondary | ICD-10-CM | POA: Diagnosis not present

## 2024-04-27 DIAGNOSIS — G4700 Insomnia, unspecified: Secondary | ICD-10-CM | POA: Insufficient documentation

## 2024-04-27 MED ORDER — ALPRAZOLAM 1 MG PO TABS: 0.5000 mg | ORAL_TABLET | Freq: Every day | ORAL | 0 refills | Status: AC

## 2024-04-27 NOTE — Assessment & Plan Note (Addendum)
 Satisfactory LDL.  Continue Atorvastatin .

## 2024-04-27 NOTE — Assessment & Plan Note (Signed)
 Occasionally related to Rheumatoid disease and encouraged to review with that specialist.

## 2024-04-27 NOTE — Progress Notes (Signed)
 Patient is in office today for a nurse visit for Immunization. Patient Injection was given in the  Right deltoid. Patient tolerated injection well.

## 2024-04-27 NOTE — Assessment & Plan Note (Signed)
 Extended discussion about Xanax  dependence (started years ago by previous provider).  He agrees to see Psych midlevel nearby, and seek assistance.

## 2024-04-27 NOTE — Assessment & Plan Note (Signed)
 Discussion as above.  He is undecided about whether he will f/u with the local pain specialist.  I advised him that its up to him, but I'm not agreeable to writing his narcotics and he'll have to make a decision.

## 2024-04-27 NOTE — Assessment & Plan Note (Signed)
 I'd like him to consider a Coronary CT due to multiple risk factors.  I encouraged referral to Dr. Krasowski and he understands but declines for now.

## 2024-04-27 NOTE — Progress Notes (Signed)
 Established Patient Office Visit  Subjective   Patient ID: Roy Tucker, male    DOB: [birth year]  Age: 73 y.o. MRN: [id number]  Chief Complaint  Patient presents with   Follow-up    Follow-up    HPI Discussed the use of AI scribe software for clinical note transcription with the patient, who gave verbal consent to proceed.  History of Present Illness   Roy Tucker is a 73 year old male who presents with issues related to pain management and sleep disturbances.  He reports difficulty with his current pain management regimen. His prior pain specialist prescribed a different pain medication that his insurance would not cover. His insurance will cover time-released morphine, but he is concerned about its addictive potential compared with his current hydrocodone/acetaminophen  10 mg/325 mg.  He has sleep disturbance with waking 2 to 3 times nightly. He has used Xanax  1 mg at night for sleep but has been out of it since seeing the current pain specialist, who does not prescribe it. He can sleep without Xanax  but finds it helps and is willing to reduce the dose to 0.5 mg. He takes Xanax  only at night and does not combine it with his pain medication.  He continues Lipitor and another cholesterol medication and follows with a cardiologist for his pacemaker.   He is also frustrated with dry mouth.     Past Medical History:  Diagnosis Date   Anxiety    Benzodiazepine dependence (HCC)    Chronic pain    known to [facility]   Dyslipidemia    Former smoker    20 pack year history before quitting 30 years ago   GERD (gastroesophageal reflux disease)    Gout 08/28/2023   History of Helicobacter pylori infection    Hypertension    Hyperthyroidism    f/by Endocrine   Insomnia    Osteoarthritis    Pacemaker    Rheumatoid arthritis (HCC)    f/by Rheumatology   Sinus node dysfunction (HCC) 01/12/2010   St.Jude pacemaker, f/by [facility]    Outpatient Encounter  Medications as of 04/27/2024  Medication Sig   atorvastatin  (LIPITOR) 20 MG tablet Take 1 tablet (20 mg total) by mouth daily.   esomeprazole  (NEXIUM ) 40 MG capsule as directed Orally Once a day   levothyroxine  (SYNTHROID , LEVOTHROID) 175 MCG tablet Take 175 mcg by mouth daily before breakfast.   loratadine  (CLARITIN ) 10 MG tablet Take 1 tablet (10 mg total) by mouth daily.   oxyCODONE -acetaminophen  (PERCOCET) 10-325 MG tablet Take 1 tablet by mouth every 6 (six) hours as needed for pain.   [DISCONTINUED] ALPRAZolam  (XANAX ) 1 MG tablet Take 0.5 mg by mouth 2 (two) times daily.   [DISCONTINUED] loratadine  (CLARITIN ) 10 MG tablet Take 1 tablet (10 mg total) by mouth daily.   ALPRAZolam  (XANAX ) 1 MG tablet Take 0.5 tablets (0.5 mg total) by mouth at bedtime.   No facility-administered encounter medications on file as of 04/27/2024.    Social History[1]    Review of Systems  Constitutional:  Positive for malaise/fatigue. Negative for weight loss.  Cardiovascular:  Negative for chest pain and palpitations.  Neurological:  Negative for dizziness.  Psychiatric/Behavioral:  Positive for depression. Negative for hallucinations, memory loss, substance abuse and suicidal ideas. The patient is nervous/anxious. The patient does not have insomnia.       Objective:     BP 128/83 (BP Location: Right Arm, Patient Position: Standing, Cuff Size: Normal)   Pulse 60  Temp (!) 97.5 F (36.4 C) (Oral)   Resp 17   Wt 186 lb 4.8 oz (84.5 kg)   SpO2 99%   BMI 28.33 kg/m    Physical Exam Constitutional:      General: He is not in acute distress.    Appearance: Normal appearance.  HENT:     Head: Normocephalic.  Cardiovascular:     Rate and Rhythm: Normal rate and regular rhythm.     Pulses: Normal pulses.     Heart sounds: Normal heart sounds.  Pulmonary:     Effort: Pulmonary effort is normal.     Breath sounds: Normal breath sounds.  Abdominal:     General: Bowel sounds are normal.      Palpations: Abdomen is soft.  Musculoskeletal:     Cervical back: Neck supple. No tenderness.     Right lower leg: No edema.     Left lower leg: No edema.  Neurological:     Mental Status: He is alert.      No results found for any visits on 04/27/24.    The 10-year ASCVD risk score (Arnett DK, et al., 2019) is: 18.3%    Assessment & Plan:  Chronic pain disorder Assessment & Plan: Discussion as above.  He is undecided about whether he will f/u with the local pain specialist.  I advised him that its up to him, but I'm not agreeable to writing his narcotics and he'll have to make a decision.   Encounter for immunization -     Flu vaccine HIGH DOSE PF(Fluzone Trivalent)  Anxiety Assessment & Plan: Extended discussion about Xanax  dependence (started years ago by previous provider).  He agrees to see Psych midlevel nearby, and seek assistance.  Orders: -     Ambulatory referral to Psychiatry -     ALPRAZolam ; Take 0.5 tablets (0.5 mg total) by mouth at bedtime.  Dispense: 15 tablet; Refill: 0  ASCVD (arteriosclerotic cardiovascular disease) Assessment & Plan: I'd like him to consider a Coronary CT due to multiple risk factors.  I encouraged referral to Dr. Krasowski and he understands but declines for now.   Xerostomia Assessment & Plan: Occasionally related to Rheumatoid disease and encouraged to review with that specialist.   Dyslipidemia Assessment & Plan: Satisfactory LDL.  Continue Atorvastatin .     No follow-ups on file.    REDDING PONCE NORLEEN FALCON., MD     [1]  Social History Tobacco Use   Smoking status: Former    Current packs/day: 0.00    Average packs/day: 1 pack/day for 15.0 years (15.0 ttl pk-yrs)    Types: Cigarettes    Start date: 12/02/1988    Quit date: 12/03/2003    Years since quitting: 20.4    Passive exposure: Never   Smokeless tobacco: Never  Vaping Use   Vaping status: Never Used  Substance Use Topics   Alcohol use: No   Drug use: No

## 2024-06-02 ENCOUNTER — Ambulatory Visit (HOSPITAL_BASED_OUTPATIENT_CLINIC_OR_DEPARTMENT_OTHER): Admitting: Family Medicine

## 2024-06-11 ENCOUNTER — Telehealth: Payer: Self-pay

## 2024-06-11 ENCOUNTER — Encounter

## 2024-06-11 DIAGNOSIS — I495 Sick sinus syndrome: Secondary | ICD-10-CM

## 2024-06-11 LAB — CUP PACEART REMOTE DEVICE CHECK
Battery Remaining Longevity: 32 mo
Battery Remaining Percentage: 54 %
Battery Voltage: 2.99 V
Brady Statistic AP VP Percent: 5.3 %
Brady Statistic AP VS Percent: 93 %
Brady Statistic AS VP Percent: 1 %
Brady Statistic AS VS Percent: 1.7 %
Brady Statistic RA Percent Paced: 97 %
Brady Statistic RV Percent Paced: 5.3 %
Date Time Interrogation Session: 20260129040013
Implantable Lead Connection Status: 753985
Implantable Lead Connection Status: 753985
Implantable Lead Implant Date: 20110901
Implantable Lead Implant Date: 20110901
Implantable Lead Location: 753859
Implantable Lead Location: 753860
Implantable Pulse Generator Implant Date: 20210426
Lead Channel Impedance Value: 380 Ohm
Lead Channel Impedance Value: 390 Ohm
Lead Channel Pacing Threshold Amplitude: 0.625 V
Lead Channel Pacing Threshold Amplitude: 4 V
Lead Channel Pacing Threshold Pulse Width: 0.5 ms
Lead Channel Pacing Threshold Pulse Width: 1 ms
Lead Channel Sensing Intrinsic Amplitude: 1.2 mV
Lead Channel Sensing Intrinsic Amplitude: 3.6 mV
Lead Channel Setting Pacing Amplitude: 1.625
Lead Channel Setting Pacing Amplitude: 7 V
Lead Channel Setting Pacing Pulse Width: 1 ms
Lead Channel Setting Sensing Sensitivity: 1 mV
Pulse Gen Model: 2272
Pulse Gen Serial Number: 3825617

## 2024-06-11 NOTE — Telephone Encounter (Signed)
 PPM Scheduled remote reviewed. Presenting rhythm: AP/VS  26 noise reversion c/w with atrial and ventricular lead noise.   Known atrial lead noise.  Known hx of RV lead issues with sensing and capture.  RV noise is new, there is oversensing of noise occurring and under pacing in both the a and the v.  Patient is not dependent.   Will forward to Dr. Francyne for review.  Recommend appt with Dr. Francyne for review of lead and question whether early gen change with new lead is appropriate with concerns for lead failure.

## 2024-06-11 NOTE — Telephone Encounter (Signed)
 OK to add on at 3:20 tomorrow. Or 8:40 on Tuesday  Feb 03.

## 2024-06-12 ENCOUNTER — Encounter: Payer: Self-pay | Admitting: Cardiovascular Disease

## 2024-06-12 ENCOUNTER — Ambulatory Visit: Attending: Cardiology | Admitting: Cardiovascular Disease

## 2024-06-12 ENCOUNTER — Ambulatory Visit: Payer: Self-pay | Admitting: Cardiovascular Disease

## 2024-06-12 VITALS — BP 139/79 | HR 89 | Ht 70.0 in | Wt 191.0 lb

## 2024-06-12 DIAGNOSIS — T82110D Breakdown (mechanical) of cardiac electrode, subsequent encounter: Secondary | ICD-10-CM

## 2024-06-12 DIAGNOSIS — T82110A Breakdown (mechanical) of cardiac electrode, initial encounter: Secondary | ICD-10-CM

## 2024-06-12 NOTE — Progress Notes (Signed)
 "   Cardiology Office Note    Date:  06/12/2024   ID:  Roy Tucker, DOB Aug 21, 1950, MRN [id number]  PCP:  Dottie Norleen PHEBE PONCE, MD  Cardiologist:   Jerel Balding, MD   No chief complaint on file.   History of Present Illness:  Roy Tucker is a 74 y.o. male with symptomatic sinus node dysfunction returning for follow-up on his dual-chamber permanent pacemaker (St. Jude Accent implanted 2011, both leads Homer C Jones 2088, generator change out in April 2021, St Jude Assurity). Additional medical problems include systemic hypertension, hyperlipidemia/prediabetes, treated hypothyroidism, rheumatoid arthritis with positive rheumatoid factor.  Over the last 2 or 3 months, he has noticed shortness of breath and weakness.  He feels that he is missing something and does not have his usual energy level.  He has normal thyroid  function test, recently checked.  He is not anemic.  Pacemaker interrogation has shown that about 2 or 3 months ago he had sudden marked worsening of sensing and capture parameters on his ventricular lead and there has been a little additional deterioration.  The R waves had decreased suddenly from 6 mV to 2.1 mV and today to measure as low as 1.8 mV.  Capture threshold today was 3.5 V at 1.0 ms pulse width.  Adjustment of the sensing parameters has led to occasional over sensing of noise on the ventricular lead.  Several episodes of noise reversion have occurred in the last couple of months.  There is evidence of missed pacing.  He was already having some problems with intermittent noise on the atrial lead.  In the past his device has recorded some episodes of atrial noise reversion or artifactual atrial arrhythmia due to atrial lead oversensing which we had were able to reproduce with isometric exercises, but not with pocket stimulation.    Pocket manipulation does not produce noise, but isometric exercise produces noise on the ventricular channel.  He reports an episode  where he was shocked by his car battery and wonders whether this damaged the pacemaker.  I told him that this is very unlikely.  He is not pacemaker dependent.  Today his underlying rhythm is sinus bradycardia at 37-45 bpm with 1: 1 AV conduction.  Statistically, he paces the atrium 97% of the time and paces the ventricle only 5% of the time.  He has not had any episodes of high ventricular rates or atrial fibrillation.    The current estimated generator longevity is 1.0-4.3 years, broadly splayed due to the high ventricular lead output.  Recent TSH showed adequate levothyroxine  supplementation, with a TSH that was actually the lower limit of normal at 0.35.  Recent hemoglobin is normal at 13.5 and he has normal renal function with a creatinine of 0.83.  Dr. Faythe actually performed a cosyntropin stimulation test to evaluate his fatigue, with normal results (an increase in cortisol to over 20).  He started treatment with Orencia for rheumatoid arthritis, with substantial improvement in his joint symptoms.  Past Medical History:  Diagnosis Date   Anxiety    Benzodiazepine dependence (HCC)    Chronic pain    known to [facility]   Dyslipidemia    Former smoker    20 pack year history before quitting 30 years ago   GERD (gastroesophageal reflux disease)    Gout 08/28/2023   History of Helicobacter pylori infection    Hypertension    Hyperthyroidism    f/by Endocrine   Insomnia    Osteoarthritis  Pacemaker    Rheumatoid arthritis (HCC)    f/by Rheumatology   Sinus node dysfunction (HCC) 01/12/2010   St.Jude pacemaker, f/by [facility]    Past Surgical History:  Procedure Laterality Date   COLONOSCOPY W/ POLYPECTOMY     NM MYOCAR PERF WALL MOTION  07/05/2010   no significant ischemia   PACEMAKER INSERTION  01/12/2010   St.Jude Accent DR RF   PPM GENERATOR CHANGEOUT N/A 09/07/2019   Procedure: PPM GENERATOR CHANGEOUT;  Surgeon: Francyne Headland, MD;  Location: [facility];  Service: Cardiovascular;  Laterality: N/A;   TOTAL KNEE ARTHROPLASTY  12/10/2011   Procedure: TOTAL KNEE ARTHROPLASTY;  Surgeon: Garnette JONETTA Raman, MD;  Location: [facility] OR;  Service: Orthopedics;  Laterality: Right;   TOTAL KNEE ARTHROPLASTY Left 03/30/2019   Procedure: TOTAL KNEE ARTHROPLASTY;  Surgeon: Raman Kemps, MD;  Location: [facility] ORS;  Service: Orthopedics;  Laterality: Left;  75 mins needed for length of case    Current Medications: Outpatient Medications Prior to Visit  Medication Sig Dispense Refill   ALPRAZolam  (XANAX ) 1 MG tablet Take 0.5 tablets (0.5 mg total) by mouth at bedtime. 15 tablet 0   atorvastatin  (LIPITOR) 20 MG tablet Take 1 tablet (20 mg total) by mouth daily. 30 tablet 2   esomeprazole  (NEXIUM ) 40 MG capsule as directed Orally Once a day     levothyroxine  (SYNTHROID , LEVOTHROID) 175 MCG tablet Take 175 mcg by mouth daily before breakfast.     loratadine  (CLARITIN ) 10 MG tablet Take 1 tablet (10 mg total) by mouth daily. 30 tablet 2   oxyCODONE -acetaminophen  (PERCOCET) 10-325 MG tablet Take 1 tablet by mouth every 6 (six) hours as needed for pain.     No facility-administered medications prior to visit.     Allergies:   Cymbalta [duloxetine hcl], Infliximab, Methimazole, Neurontin [gabapentin], Tigecycline, Aspirin, and Tylenol  [acetaminophen ]   Social History   Socioeconomic History   Marital status: Married    Spouse name: Not on file   Number of children: 2   Years of education: Not on file   Highest education level: Not on file  Occupational History   Occupation: Retired from [employer]  Tobacco Use   Smoking status: Former    Current packs/day: 0.00    Average packs/day: 1 pack/day for 15.0 years (15.0 ttl pk-yrs)    Types: Cigarettes    Start date: 12/02/1988    Quit date: 12/03/2003    Years since quitting: 20.5    Passive exposure: Never   Smokeless tobacco: Never  Vaping Use   Vaping status: Never Used  Substance and Sexual Activity   Alcohol  use: No   Drug use: No   Sexual activity: Not Currently  Other Topics Concern   Not on file  Social History Narrative   Not on file   Social Drivers of Health   Tobacco Use: Medium Risk (06/12/2024)   Patient History    Smoking Tobacco Use: Former    Smokeless Tobacco Use: Never    Passive Exposure: Never  Physicist, Medical Strain: Low Risk (12/23/2023)   Overall Financial Resource Strain (CARDIA)    Difficulty of Paying Living Expenses: Not very hard  Food Insecurity: No Food Insecurity (03/25/2024)   Epic    Worried About Radiation Protection Practitioner of Food in the Last Year: Never true    Ran Out of Food in the Last Year: Never true  Transportation Needs: No Transportation Needs (03/25/2024)   Epic    Lack of Transportation (Medical):  No    Lack of Transportation (Non-Medical): No  Physical Activity: Insufficiently Active (03/25/2024)   Exercise Vital Sign    Days of Exercise per Week: 7 days    Minutes of Exercise per Session: 10 min  Stress: No Stress Concern Present (03/25/2024)   [employer] of Occupational Health - Occupational Stress Questionnaire    Feeling of Stress: Not at all  Social Connections: Socially Integrated (03/25/2024)   Social Connection and Isolation Panel    Frequency of Communication with Friends and Family: More than three times a week    Frequency of Social Gatherings with Friends and Family: More than three times a week    Attends Religious Services: More than 4 times per year    Active Member of Clubs or Organizations: Yes    Attends Banker Meetings: More than 4 times per year    Marital Status: Married  Depression (PHQ2-9): Low Risk (03/25/2024)   Depression (PHQ2-9)    PHQ-2 Score: 2  Alcohol Screen: Low Risk (12/23/2023)   Alcohol Screen    Last Alcohol Screening Score (AUDIT): 0  Housing: Low Risk (03/25/2024)   Epic    Unable to Pay for Housing in the Last Year: No    Number of Times Moved in the Last Year: 0    Homeless in the  Last Year: No  Utilities: Not At Risk (03/25/2024)   Epic    Threatened with loss of utilities: No  Health Literacy: Adequate Health Literacy (03/25/2024)   B1300 Health Literacy    Frequency of need for help with medical instructions: Never       ROS:   Please see the history of present illness.    ROS All other systems are reviewed and are negative.   PHYSICAL EXAM:   VS:  BP 139/79   Pulse 89   Ht 5' 10 (1.778 m)   Wt 191 lb (86.6 kg)   SpO2 98%   BMI 27.41 kg/m      General: Alert, oriented x3, no distress, healthy left subclavian pacemaker site. Head: no evidence of trauma, PERRL, EOMI, no exophtalmos or lid lag, no myxedema, no xanthelasma; normal ears, nose and oropharynx Neck: normal jugular venous pulsations and no hepatojugular reflux; brisk carotid pulses without delay and no carotid bruits Chest: clear to auscultation, no signs of consolidation by percussion or palpation, normal fremitus, symmetrical and full respiratory excursions Cardiovascular: normal position and quality of the apical impulse, regular rhythm, normal first and second heart sounds, no murmurs, rubs or gallops Abdomen: no tenderness or distention, no masses by palpation, no abnormal pulsatility or arterial bruits, normal bowel sounds, no hepatosplenomegaly Extremities: no clubbing, cyanosis or edema; 2+ radial, ulnar and brachial pulses bilaterally; 2+ right femoral, posterior tibial and dorsalis pedis pulses; 2+ left femoral, posterior tibial and dorsalis pedis pulses; no subclavian or femoral bruits Neurological: grossly nonfocal Psych: Normal mood and affect   Wt Readings from Last 3 Encounters:  06/12/24 191 lb (86.6 kg)  04/27/24 186 lb 4.8 oz (84.5 kg)  04/07/24 182 lb (82.6 kg)      Studies/Labs Reviewed:   EKG: Personally reviewed ECG tracing from 04/07/2024 which shows atrial paced, ventricular sensed rhythm with ventricular under sensing and pseudofusion.  Underlying QRS complex  shows left axis deviation, borderline criteria for left anterior fascicular block.  EKG Interpretation Date/Time:    Ventricular Rate:    PR Interval:    QRS Duration:    QT Interval:  QTC Calculation:   R Axis:      Text Interpretation:           Recent Labs: 08/28/2023: ALT 10; BUN 11; Creatinine, Ser 0.79; Hemoglobin 13.2; Platelets 163.0; Potassium 4.1; Sodium 139; TSH 0.28   Lipid Panel    Component Value Date/Time   CHOL 160 08/28/2023 1100   TRIG 160.0 (H) 08/28/2023 1100   HDL 44.50 08/28/2023 1100   CHOLHDL 4 08/28/2023 1100   VLDL 32.0 08/28/2023 1100   LDLCALC 84 08/28/2023 1100   He reports that he had a repeat lipid profile with Luke Ivory couple of months ago, but this is not currently available for review.  ASSESSMENT:    1. Pacemaker lead failure, initial encounter      PLAN:  In order of problems listed above:  SSS: He has severe sinus bradycardia and over the last several years has had virtually 100% atrial paced, ventricular sensed rhythm.  Due to ventricular under sensing there was a sudden marked increase in ventricular pacing last fall and this is no longer an issue after we adjusted the sensing, but he is having episodes of ventricular lead noise reversion now.  Ventricular lead noise can be reproduced with isometric exercise.  There is also a marked increase in the pacing threshold on the ventricular lead, although this is a less important problem since he does not really require ventricular pacing.  The timing of the change in lead parameters seems to coincide with his complaints of shortness of breath and weakness.  He does not have anemia, hypothyroidism or adrenal insufficiency, which have been evaluated as positive alternative explanations for his fatigue. PPM: Previous problems with noise on the atrial lead (2088) are no longer an issue, but his ventricular lead has major abnormalities with poor sensing and very high capture threshold.  The  sensing issue seems to be resolved by reprogramming sensing to 1.0 mV, but he is now having multiple episodes of noise reversion.  Sensed R waves today were as low as 1.8 mV, making additional adjustments and sensitivity unlikely to help.  We discussed options to manage this either by placing new leads and abandoning the old ones, which would make his system not be MRI conditional and runs the risk of subclavian vein related problems.  Will discuss with our EP partners, but probably the best solution would be extraction of both the atrial and ventricular lead and implantation of an entirely new system.   PAT: None has been recorded after adjustment of the sensing on his atrial lead.  All of these previous events were probably artifactual related to atrial noise oversensing.  HTN: Fair control. HLP/preDM: He does not have known CAD or PAD.  Most recent LDL cholesterol of 84, HDL 45, triglycerides are acceptable values.  Continue statin.  Hemoglobin A1c earlier this year was in prediabetes range at 6.2%.  He remains mildly overweight.  Discussed avoiding sweets, sugary drinks, starches with high glycemic index and trying to exercise regularly. Rheumatoid arthritis: Positive rheumatoid factor.  His rheumatologist is Redell Ness, PA at [facility].  He reports marked improvement in his joint symptoms after starting infusions with intravenous Orencia.  He is not on steroid therapy.  His recent sedimentation rate was low at 21.   Addendum: Discussed further management with Dr. Kennyth and Dr. Almetta and they are in agreement that the best next step will be atrial and ventricular lead extraction and implantation of a new pacemaker system.  Medication Adjustments/Labs and Tests Ordered: Current medicines are reviewed at length with the patient today.  Concerns regarding medicines are outlined above.  Medication changes, Labs and Tests ordered today are listed in the Patient Instructions  below. Patient Instructions  Medication Instructions:  No changes *If you need a refill on your cardiac medications before your next appointment, please call your pharmacy*  Lab Work: None ordered If you have labs (blood work) drawn today and your tests are completely normal, you will receive your results only by: MyChart Message (if you have MyChart) OR A paper copy in the mail If you have any lab test that is abnormal or we need to change your treatment, we will call you to review the results.  Testing/Procedures: None ordered  Follow-Up: At [facility], you and your health needs are our priority.  As part of our continuing mission to provide you with exceptional heart care, our providers are all part of one team.  This team includes your primary Cardiologist (physician) and Advanced Practice Providers or APPs (Physician Assistants and Nurse Practitioners) who all work together to provide you with the care you need, when you need it.  Your next appointment:   3 month(s)- Pacer Check  Provider:   Jerel Balding, MD    We recommend signing up for the patient portal called MyChart.  Sign up information is provided on this After Visit Summary.  MyChart is used to connect with patients for Virtual Visits (Telemedicine).  Patients are able to view lab/test results, encounter notes, upcoming appointments, etc.  Non-urgent messages can be sent to your provider as well.   To learn more about what you can do with MyChart, go to [email or web address].        Signed, Jerel Balding, MD  06/12/2024 5:24 PM    [facility] [address] [place], [place], [place]  [address] Phone: [phone]; Fax: [phone]   "

## 2024-06-12 NOTE — Telephone Encounter (Signed)
 Outreach made to Pt's wife.  Wife agreeable to appointment today at 3:20 pm.

## 2024-06-12 NOTE — Patient Instructions (Signed)
 Medication Instructions:  No changes *If you need a refill on your cardiac medications before your next appointment, please call your pharmacy*  Lab Work: None ordered If you have labs (blood work) drawn today and your tests are completely normal, you will receive your results only by: MyChart Message (if you have MyChart) OR A paper copy in the mail If you have any lab test that is abnormal or we need to change your treatment, we will call you to review the results.  Testing/Procedures: None ordered  Follow-Up: At [facility], you and your health needs are our priority.  As part of our continuing mission to provide you with exceptional heart care, our providers are all part of one team.  This team includes your primary Cardiologist (physician) and Advanced Practice Providers or APPs (Physician Assistants and Nurse Practitioners) who all work together to provide you with the care you need, when you need it.  Your next appointment:   3 month(s)- Pacer Check  Provider:   Jerel Balding, MD    We recommend signing up for the patient portal called MyChart.  Sign up information is provided on this After Visit Summary.  MyChart is used to connect with patients for Virtual Visits (Telemedicine).  Patients are able to view lab/test results, encounter notes, upcoming appointments, etc.  Non-urgent messages can be sent to your provider as well.   To learn more about what you can do with MyChart, go to [email or web address].

## 2024-06-15 ENCOUNTER — Telehealth: Payer: Self-pay

## 2024-06-15 NOTE — Telephone Encounter (Signed)
 LM for pt to return my call - he needs to be scheduled to see Dr. Almetta in clinic 2/6 and for a PPM/Lead Extraction on 2/16.

## 2024-06-15 NOTE — Telephone Encounter (Signed)
 Spoke to pt's Wife - pt is scheduled to see Dr. Almetta in clinic on 2/6 at 230. His PPM/Lead extraction and reimplant is scheduled for 2/16 at 730 am.   He will get labs, scrub and go over instructions while in office for appt on 2/6.

## 2024-06-18 NOTE — Progress Notes (Signed)
 Remote PPM Transmission

## 2024-06-19 ENCOUNTER — Ambulatory Visit: Admitting: Student in an Organized Health Care Education/Training Program

## 2024-06-19 ENCOUNTER — Encounter: Payer: Self-pay | Admitting: Student in an Organized Health Care Education/Training Program

## 2024-06-19 VITALS — BP 124/70 | HR 64 | Ht 70.0 in | Wt 188.0 lb

## 2024-06-19 DIAGNOSIS — Z01812 Encounter for preprocedural laboratory examination: Secondary | ICD-10-CM

## 2024-06-19 DIAGNOSIS — T82110A Breakdown (mechanical) of cardiac electrode, initial encounter: Secondary | ICD-10-CM

## 2024-06-19 DIAGNOSIS — Z950 Presence of cardiac pacemaker: Secondary | ICD-10-CM

## 2024-06-19 DIAGNOSIS — I495 Sick sinus syndrome: Secondary | ICD-10-CM

## 2024-06-19 LAB — BASIC METABOLIC PANEL WITH GFR

## 2024-06-19 LAB — CBC
Hematocrit: 43.2 % (ref 37.5–51.0)
Hemoglobin: 14.7 g/dL (ref 13.0–17.7)
MCH: 30.6 pg (ref 26.6–33.0)
MCHC: 34 g/dL (ref 31.5–35.7)
MCV: 90 fL (ref 79–97)
Platelets: 145 10*3/uL — ABNORMAL LOW (ref 150–450)
RBC: 4.81 x10E6/uL (ref 4.14–5.80)
RDW: 13.7 % (ref 11.6–15.4)
WBC: 5.7 10*3/uL (ref 3.4–10.8)

## 2024-06-19 NOTE — Progress Notes (Unsigned)
" °  Cardiology Office Note   Date:  06/19/2024  ID:  Roy Tucker, DOB 18-Jun-1950, MRN [id number] PCP: Dottie Norleen PHEBE PONCE, MD  [facility] Providers Cardiologist:  Jerel Balding, MD { Click to update primary MD,subspecialty MD or APP then REFRESH:1}    History of Present Illness Roy Tucker is a 74 y.o. male ***  ROS: ***  Studies Reviewed      *** Risk Assessment/Calculations {Does this patient have ATRIAL FIBRILLATION?:[phone]}         Physical Exam VS:  BP 124/70 (BP Location: Left Arm, Patient Position: Sitting, Cuff Size: Large)   Pulse 64   Ht 5' 10 (1.778 m)   Wt 188 lb (85.3 kg)   SpO2 96%   BMI 26.98 kg/m        Wt Readings from Last 3 Encounters:  06/19/24 188 lb (85.3 kg)  06/12/24 191 lb (86.6 kg)  04/27/24 186 lb 4.8 oz (84.5 kg)    GEN: Well nourished, well developed in no acute distress NECK: No JVD; No carotid bruits CARDIAC: ***RRR, no murmurs, rubs, gallops RESPIRATORY:  Clear to auscultation without rales, wheezing or rhonchi  ABDOMEN: Soft, non-tender, non-distended EXTREMITIES:  No edema; No deformity   ASSESSMENT AND PLAN ***    {Are you ordering a CV Procedure (e.g. stress test, cath, DCCV, TEE, etc)?   Press F2        :[id number]}  Dispo: ***  Signed, Donnice DELENA Primus, MD  "

## 2024-06-19 NOTE — Patient Instructions (Signed)
 Medication Instructions:  Your physician recommends that you continue on your current medications as directed. Please refer to the Current Medication list given to you today.  *If you need a refill on your cardiac medications before your next appointment, please call your pharmacy*  Lab Work: CBC and BMET today  If you have labs (blood work) drawn today and your tests are completely normal, you will receive your results only by: MyChart Message (if you have MyChart) OR A paper copy in the mail If you have any lab test that is abnormal or we need to change your treatment, we will call you to review the results.  Testing/Procedures: None ordered.   Follow-Up: At [facility], you and your health needs are our priority.  As part of our continuing mission to provide you with exceptional heart care, our providers are all part of one team.  This team includes your primary Cardiologist (physician) and Advanced Practice Providers or APPs (Physician Assistants and Nurse Practitioners) who all work together to provide you with the care you need, when you need it.  Your next appointment:   To be scheduled

## 2024-06-19 NOTE — Progress Notes (Unsigned)
" °  Cardiology Office Note   Date:  06/19/2024  ID:  CHEYENNE SCHUMM, DOB 18-Jun-1950, MRN [id number] PCP: Dottie Norleen PHEBE PONCE, MD  [facility] Providers Cardiologist:  Jerel Balding, MD { Click to update primary MD,subspecialty MD or APP then REFRESH:1}    History of Present Illness Roy Tucker is a 74 y.o. male ***  ROS: ***  Studies Reviewed      *** Risk Assessment/Calculations {Does this patient have ATRIAL FIBRILLATION?:[id number]}         Physical Exam VS:  BP 124/70 (BP Location: Left Arm, Patient Position: Sitting, Cuff Size: Large)   Pulse 64   Ht 5' 10 (1.778 m)   Wt 188 lb (85.3 kg)   SpO2 96%   BMI 26.98 kg/m        Wt Readings from Last 3 Encounters:  06/19/24 188 lb (85.3 kg)  06/12/24 191 lb (86.6 kg)  04/27/24 186 lb 4.8 oz (84.5 kg)    GEN: Well nourished, well developed in no acute distress NECK: No JVD; No carotid bruits CARDIAC: ***RRR, no murmurs, rubs, gallops RESPIRATORY:  Clear to auscultation without rales, wheezing or rhonchi  ABDOMEN: Soft, non-tender, non-distended EXTREMITIES:  No edema; No deformity   ASSESSMENT AND PLAN ***    {Are you ordering a CV Procedure (e.g. stress test, cath, DCCV, TEE, etc)?   Press F2        :[id number]}  Dispo: ***  Signed, Donnice DELENA Primus, MD  "

## 2024-06-29 ENCOUNTER — Encounter (HOSPITAL_COMMUNITY): Payer: Self-pay

## 2024-06-29 ENCOUNTER — Ambulatory Visit (HOSPITAL_COMMUNITY): Admit: 2024-06-29 | Admitting: Student in an Organized Health Care Education/Training Program

## 2024-09-10 ENCOUNTER — Encounter

## 2024-09-17 ENCOUNTER — Ambulatory Visit: Admitting: Cardiovascular Disease

## 2024-12-10 ENCOUNTER — Encounter
